# Patient Record
Sex: Female | Born: 1940 | Race: White | Hispanic: No | Marital: Married | Smoking: Current every day smoker
Health system: Southern US, Community
[De-identification: ages and names within clinical notes are randomized; demographics above are authoritative.]

## PROBLEM LIST (undated history)

## (undated) DIAGNOSIS — I639 Cerebral infarction, unspecified: Secondary | ICD-10-CM

## (undated) DIAGNOSIS — I701 Atherosclerosis of renal artery: Secondary | ICD-10-CM

## (undated) DIAGNOSIS — I739 Peripheral vascular disease, unspecified: Secondary | ICD-10-CM

## (undated) DIAGNOSIS — I10 Essential (primary) hypertension: Secondary | ICD-10-CM

## (undated) DIAGNOSIS — E785 Hyperlipidemia, unspecified: Secondary | ICD-10-CM

## (undated) DIAGNOSIS — I6529 Occlusion and stenosis of unspecified carotid artery: Secondary | ICD-10-CM

## (undated) DIAGNOSIS — I2510 Atherosclerotic heart disease of native coronary artery without angina pectoris: Secondary | ICD-10-CM

## (undated) HISTORY — DX: Atherosclerotic heart disease of native coronary artery without angina pectoris: I25.10

## (undated) HISTORY — DX: Occlusion and stenosis of unspecified carotid artery: I65.29

## (undated) HISTORY — DX: Peripheral vascular disease, unspecified: I73.9

## (undated) HISTORY — DX: Essential (primary) hypertension: I10

## (undated) HISTORY — PX: INGUINAL HERNIA REPAIR: SUR1180

## (undated) HISTORY — PX: TUBAL LIGATION: SHX77

## (undated) HISTORY — PX: VASCULAR SURGERY: SHX849

## (undated) HISTORY — DX: Hyperlipidemia, unspecified: E78.5

## (undated) HISTORY — DX: Cerebral infarction, unspecified: I63.9

---

## 2005-03-11 ENCOUNTER — Ambulatory Visit (HOSPITAL_COMMUNITY): Admission: RE | Admit: 2005-03-11 | Discharge: 2005-03-11 | Payer: Self-pay | Admitting: Vascular Surgery

## 2005-04-04 HISTORY — PX: OTHER SURGICAL HISTORY: SHX169

## 2006-01-04 DIAGNOSIS — I639 Cerebral infarction, unspecified: Secondary | ICD-10-CM

## 2006-01-04 HISTORY — DX: Cerebral infarction, unspecified: I63.9

## 2006-04-12 ENCOUNTER — Ambulatory Visit: Payer: Self-pay | Admitting: Vascular Surgery

## 2006-06-26 IMAGING — CR DG CHEST 2V
2 series · 2 of 2 positions shown · non-contrast
Comparison: none

CLINICAL DATA: Left fem/pop occlusive disease with embolism to the left toes.   Preop chest x-ray.
 CHEST - 2 VIEW:

[view not recorded (1 of 2)]
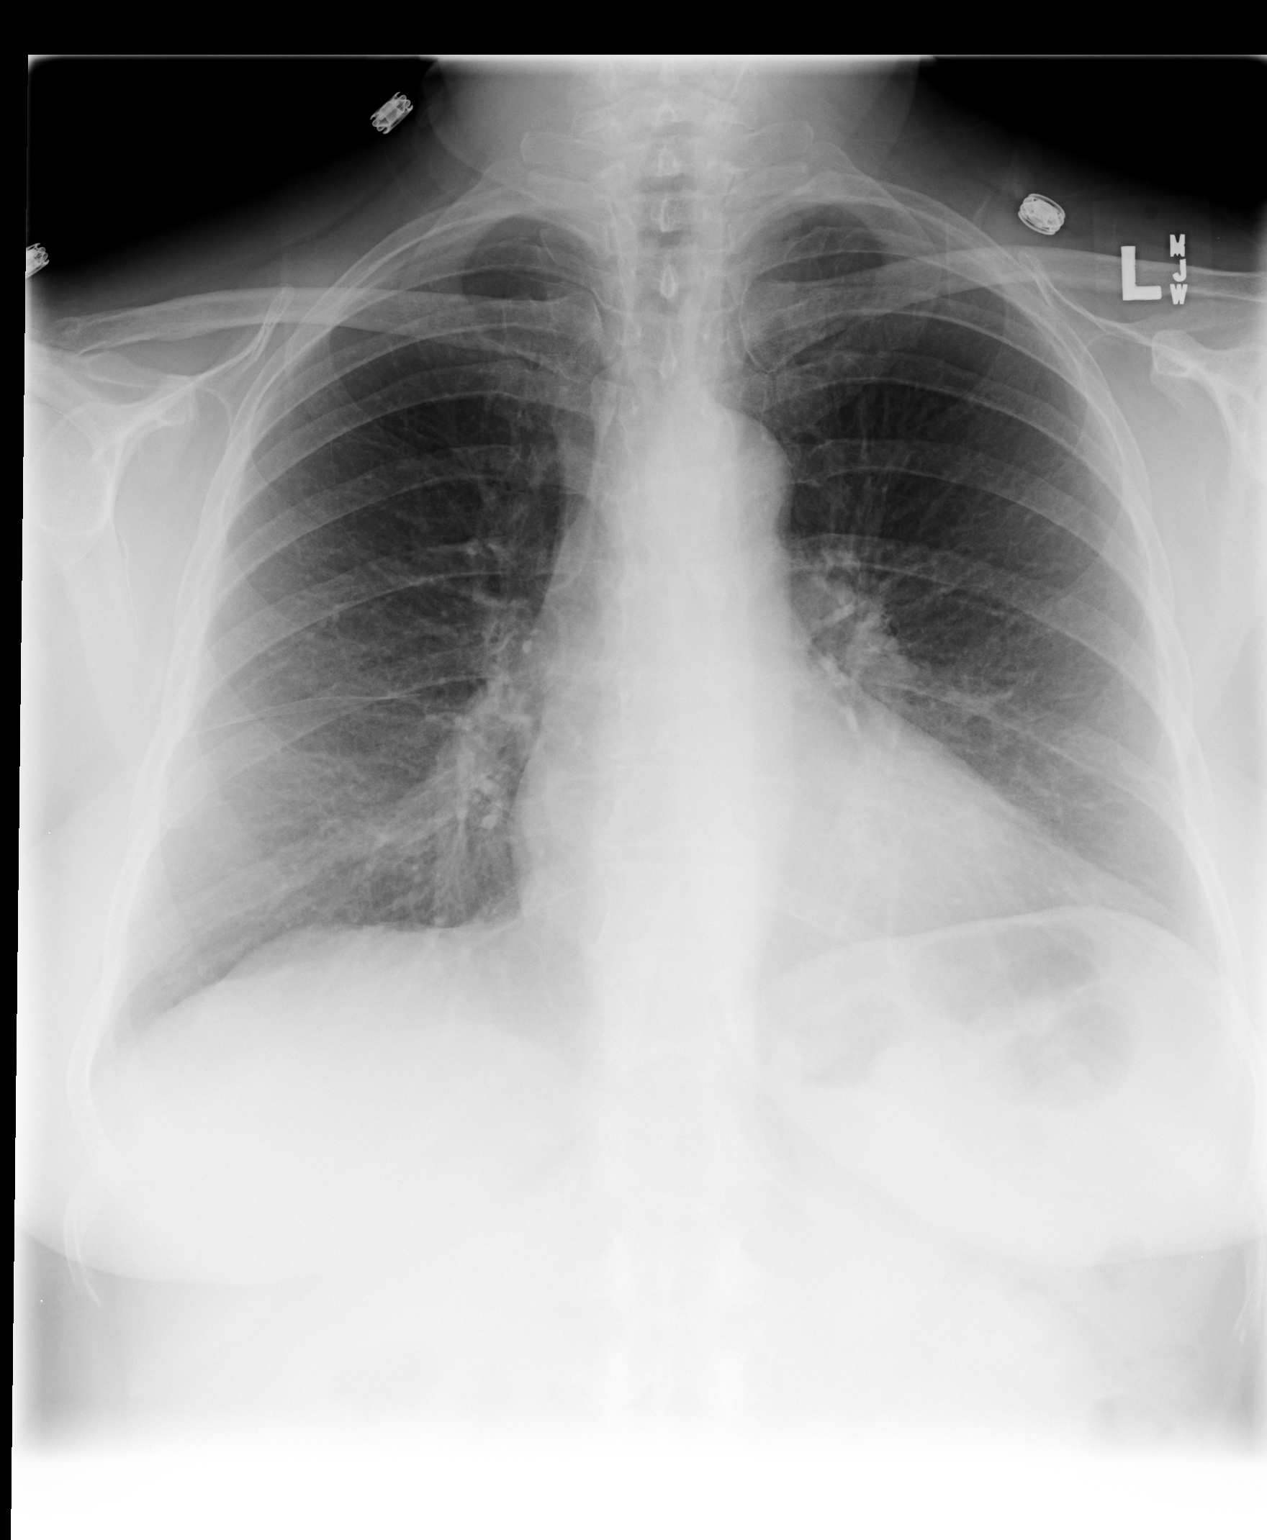

[view not recorded (2 of 2)]
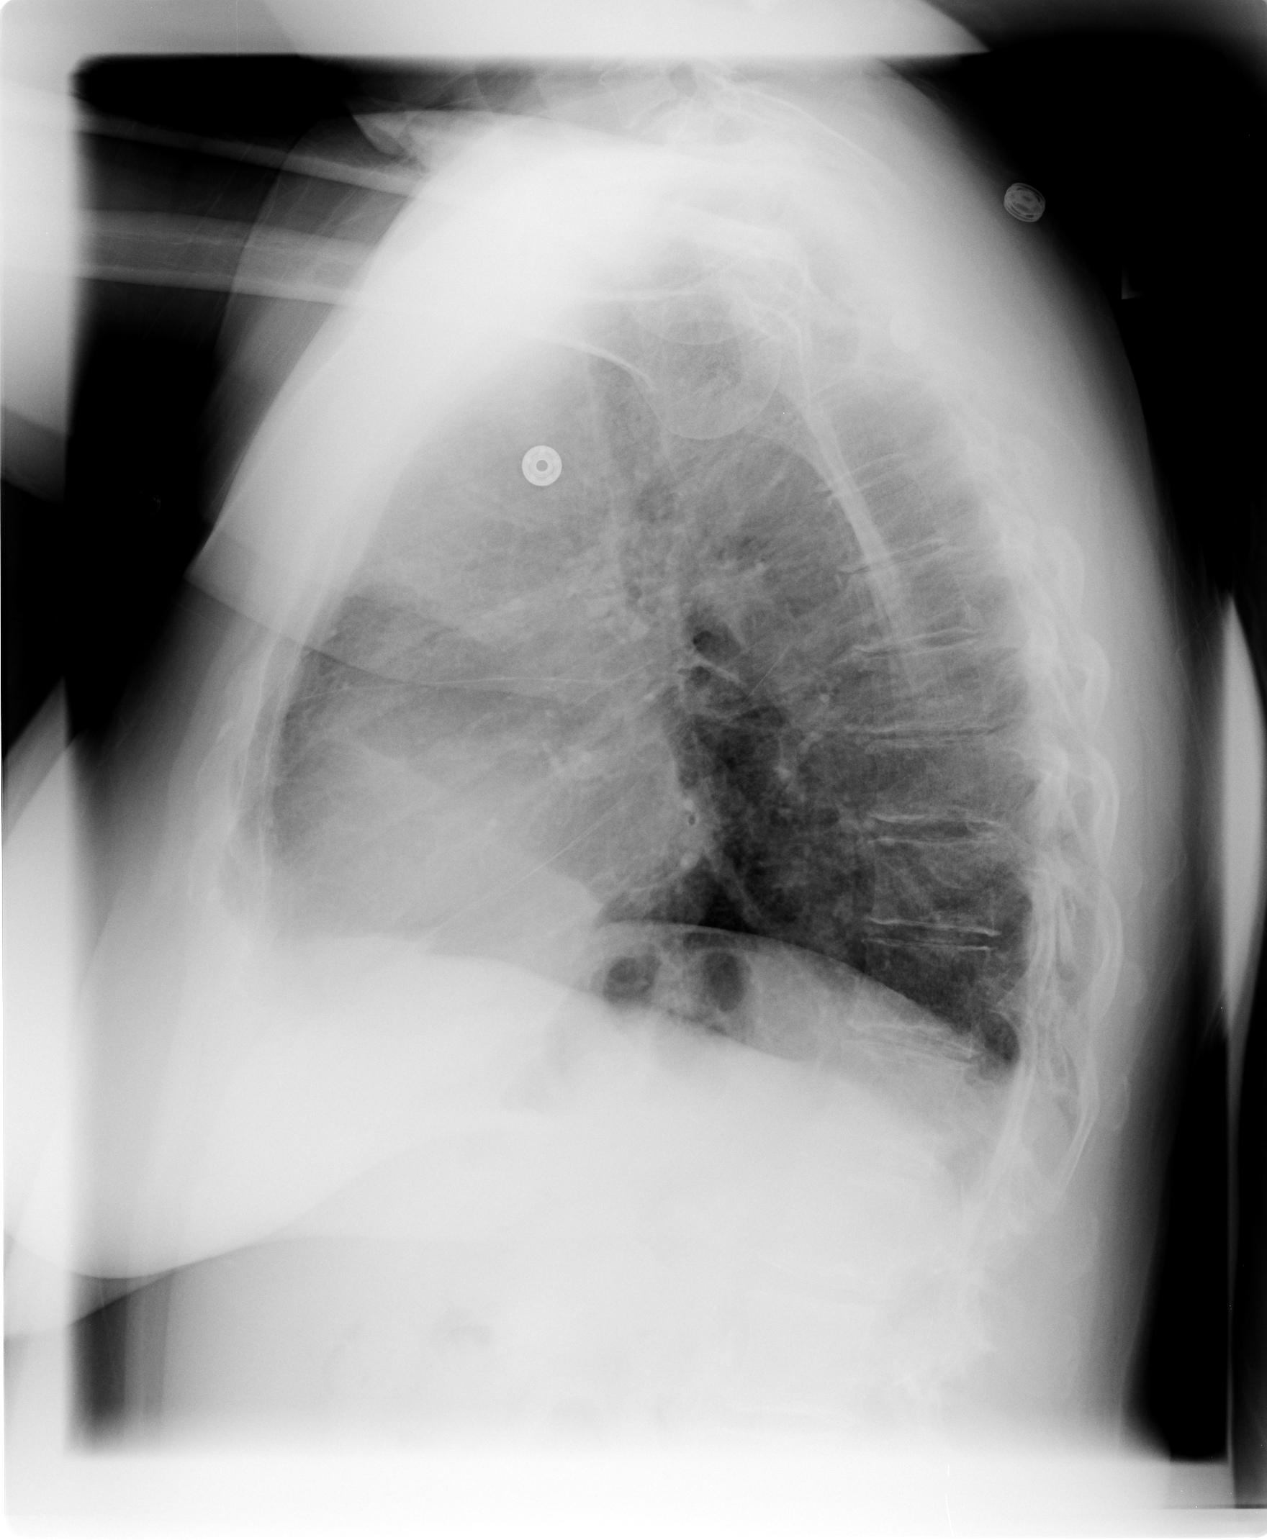

[2 of 2 positions shown; findings below may reference images not displayed]

FINDINGS: Two views of the chest show no active infiltrate or effusion.  There is peribronchial thickening noted.  The heart is within the upper limits of normal.  No bony abnormality is seen.
IMPRESSION: No active lung disease.  Peribronchial thickening.

## 2007-04-21 ENCOUNTER — Ambulatory Visit: Payer: Self-pay | Admitting: Vascular Surgery

## 2008-01-05 HISTORY — PX: OTHER SURGICAL HISTORY: SHX169

## 2008-10-08 ENCOUNTER — Ambulatory Visit: Payer: Self-pay | Admitting: Vascular Surgery

## 2008-10-17 ENCOUNTER — Ambulatory Visit (HOSPITAL_COMMUNITY): Admission: RE | Admit: 2008-10-17 | Discharge: 2008-10-17 | Payer: Self-pay | Admitting: Surgery

## 2008-10-17 ENCOUNTER — Ambulatory Visit: Payer: Self-pay | Admitting: Surgery

## 2008-11-12 ENCOUNTER — Ambulatory Visit: Payer: Self-pay | Admitting: Vascular Surgery

## 2009-01-30 ENCOUNTER — Ambulatory Visit: Payer: Self-pay | Admitting: Vascular Surgery

## 2009-04-21 ENCOUNTER — Ambulatory Visit: Payer: Self-pay | Admitting: Surgery

## 2009-04-29 ENCOUNTER — Ambulatory Visit: Payer: Self-pay | Admitting: Surgery

## 2009-04-29 ENCOUNTER — Ambulatory Visit (HOSPITAL_COMMUNITY): Admission: RE | Admit: 2009-04-29 | Discharge: 2009-04-29 | Payer: Self-pay | Admitting: Surgery

## 2009-04-30 HISTORY — PX: OTHER SURGICAL HISTORY: SHX169

## 2009-05-30 ENCOUNTER — Ambulatory Visit: Payer: Self-pay | Admitting: Surgery

## 2009-11-18 ENCOUNTER — Ambulatory Visit: Payer: Self-pay | Admitting: Vascular Surgery

## 2010-03-24 LAB — POCT I-STAT, CHEM 8
Calcium, Ion: 1.22 mmol/L (ref 1.12–1.32)
HCT: 41 % (ref 36.0–46.0)
Hemoglobin: 13.9 g/dL (ref 12.0–15.0)

## 2010-04-09 LAB — POCT I-STAT, CHEM 8
BUN: 22 mg/dL (ref 6–23)
Creatinine, Ser: 1 mg/dL (ref 0.4–1.2)
Hemoglobin: 14.3 g/dL (ref 12.0–15.0)
Sodium: 144 mEq/L (ref 135–145)

## 2010-05-19 NOTE — Procedures (Signed)
CAROTID DUPLEX EXAM   INDICATION:  Carotid disease.   HISTORY:  Diabetes:  No.  Cardiac:  No.  Hypertension:  Yes.  Smoking:  Yes.  Previous Surgery:  No carotid surgery.  CV History:  Patient states a history of TIA, currently asymptomatic.  Amaurosis Fugax No, Paresthesias No, Hemiparesis No.                                       RIGHT             LEFT  Brachial systolic pressure:         140               137  Brachial Doppler waveforms:         Normal            Normal  Vertebral direction of flow:        Antegrade         Antegrade  DUPLEX VELOCITIES (cm/sec)  CCA peak systolic                   68                89  ECA peak systolic                   104               79  ICA peak systolic                   150               182  ICA end diastolic                   43                47  PLAQUE MORPHOLOGY:                  Heterogenous      Heterogenous  PLAQUE AMOUNT:                      Mild              Mild  PLAQUE LOCATION:                    Distal CCA        ICA/ECA/CCA   IMPRESSION:  1. Doppler velocities of the bilateral internal carotid arteries      suggest 40% to 59% stenoses; however, the elevated velocities      appear to be mostly due to vessel tortuosity of the bilateral      proximal-to-mid internal carotid arteries.  2. No significant change in velocities when compared to the previous      examination on 10/07/2008.   ___________________________________________  Quita Skye. Hart Rochester, M.D.   CH/MEDQ  D:  11/18/2009  T:  11/18/2009  Job:  [id number]

## 2010-05-19 NOTE — Procedures (Signed)
BYPASS GRAFT EVALUATION   INDICATION:  Followup of a right Fem-Pop bypass graft and PTA stent.   HISTORY:  Diabetes:  No.  Cardiac:  No.  Hypertension:  Yes.  Smoking:  Yes.  Previous Surgery:  Left fem-pop graft and PTA stent in March, 2007.   SINGLE LEVEL ARTERIAL EXAM                               RIGHT              LEFT  Brachial:                    117                128  Anterior tibial:             57                 84  Posterior tibial:            99                 120  Peroneal:  Ankle/brachial index:        0.77               0.94   PREVIOUS ABI:  Date: 04/21/07  RIGHT:  1  LEFT:  1   LOWER EXTREMITY BYPASS GRAFT DUPLEX EXAM:   DUPLEX:  Bypass graft is patent with no stenosis noted.   IMPRESSION:  1. Ankle brachial index on the right suggests moderate disease.  2. Ankle brachial index on the left suggests within normal limits.   ___________________________________________  Quita Skye Hart Rochester, M.D.   CJ/MEDQ  D:  10/08/2008  T:  10/09/2008  Job:  [id number]

## 2010-05-19 NOTE — Procedures (Signed)
BYPASS GRAFT EVALUATION   INDICATION:  Follow up bilateral lower extremity revascularization with  left increased claudication for approximately 1 week.   HISTORY:  Diabetes:  No.  Cardiac:  No.  Hypertension:  Yes.  Smoking:  Yes.  Previous Surgery:  Right SFA/popliteal artery stents, 10/17/2008 by Dr.  Myra Gianotti.  Left SFA stent, 03/11/2005 by Dr. Hart Rochester.   SINGLE LEVEL ARTERIAL EXAM                               RIGHT              LEFT  Brachial:                    132                133  Anterior tibial:             121                94  Posterior tibial:            126                99  Peroneal:  Ankle/brachial index:        0.95               0.74   PREVIOUS ABI:  Date: 01/30/2009  RIGHT:  0.97  LEFT:  0.95   LOWER EXTREMITY BYPASS GRAFT DUPLEX EXAM:   DUPLEX:  1. Bilateral common femoral arteries through popliteal arteries appear      patent, including bilateral SFA stents.  2. Stable elevated velocities at the right proximal SFA of 361 cm/s      and left proximal SFA of 225 cm/s.  3. New elevated velocities of 420 cm/s were noted in the left      popliteal artery.   IMPRESSION:  1. Patent bilateral superficial femoral artery stents with no in-stent      stenosis.  2. Stable >50% proximal superficial femoral artery stenoses.  3. New >75% left popliteal artery stenosis.  4. Right ankle brachial index appears stable.  5. Left ankle brachial index shows decrease from previous study.        ___________________________________________  V. Charlena Cross, MD   AS/MEDQ  D:  04/21/2009  T:  04/21/2009  Job:  [id number]

## 2010-05-19 NOTE — Assessment & Plan Note (Signed)
OFFICE VISIT   Felicia [name], Felicia [name]  DOB:  Jul 08, 1940                                       11/12/2008  CHART#:[id number]   The patient is a 70 year old female who recently developed embolic  episode to her right foot and underwent angiography with PTA and  stenting of the right superficial femoral and popliteal artery by Dr.  Myra [name] IV.  [name] had previously stented her left superficial femoral  artery for a similar problem in March 2007.  Since then she has noted  complete resolution of her claudication symptoms and has had no  discomfort in the toes.  [name] ordered lower extremity arterial Doppler  studies today which [name] interpreted and reviewed and she does have  biphasic flow in both feet with an ABI of 0.98 on the right and 0.75 on  the left, slightly down on the left side.   PAST MEDICAL HISTORY:  She has the following stable chronic problems:  1. Hypertension.  2. Hyperlipidemia.  3. 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  4. Negative for coronary artery disease, COPD or diabetes.   REVIEW OF SYSTEMS:  Denies any chest pain, dyspnea on exertion.  No  pulmonary problems such as hemoptysis or chronic bronchitis.  All other  systems are negative except for the previous mini stroke.   SOCIAL HISTORY:  She is married and has three children.  Continues to  smoke third of a pack of cigarettes per day.   PHYSICAL EXAM:  Blood pressure 138/70, heart rate 66, respirations 16.  She is alert and oriented x3.  Neck is supple with 3+ carotid pulses.  No bruits are audible.  Neurologic examination is normal.  No palpable  adenopathy in the neck.  Chest:  Clear to auscultation.  Cardiovascular:  Regular rhythm with no murmurs.  Abdomen:  Soft and nontender with no  masses.  She has 3+ femoral and 2+ popliteal pulses bilaterally with  well-perfused lower extremities.   She is doing well from the standpoint of her stents in the  right leg and  we will follow her on a regular basis in the protocol.  [name] have ordered a  repeat scan in 6 months of her lower extremity stent sites and also a  repeat carotid duplex exam to be done in 12 months to follow her  moderate carotid disease.  If she has any new symptoms she will be in  touch with [place] in the interim.  She is currently taking Aggrenox  recommended by her neurologist.   Felicia [name]. Felicia [name], M.D.  Electronically Signed   JDL/MEDQ  D:  11/12/2008  T:  11/13/2008  Job:  [id number]   cc:   Dr. Donnetta Hutching

## 2010-05-19 NOTE — Assessment & Plan Note (Signed)
OFFICE VISIT   Felicia, WINDHOLZ [name]  DOB:  March 16, 1940                                       11/18/2009  CHART#:[id number]   The patient returns for continued follow-up regarding her lower  extremity occlusive disease and carotid occlusive disease.  She denies  any hemispheric or nonhemispheric TIAs, amaurosis fugax, diplopia,  blurred vision or syncope.  She most recently had balloon angioplasty of  her popliteal artery by Dr. Myra [name] distal to a previously placed stent.  This was performed in April of this year.  She has had no claudication  symptoms in either calf since that time.  She takes Aggrenox on a daily  basis.   CHRONIC MEDICAL PROBLEMS:  1. Hypertension.  2. Hyperlipidemia.  3. Previous small CVA with carotid disease which is mild to moderate.  4. Negative for coronary artery disease, COPD or diabetes.   SOCIAL HISTORY:  Married, has three children.  She tends to smoke about  third pack of cigarettes per day.   REVIEW OF SYSTEMS:  Denies any chest pain, dyspnea on exertion,  claudication, no symptoms complained of in review of systems.   PHYSICAL EXAMINATION:  Blood pressure 141/66, heart rate 64,  respirations 14.  General:  Well-developed, well-nourished female in no  apparent distress.  HEENT:  Exam is normal for age.  EOMs intact.  Lungs:  Clear to auscultation.  No rhonchi or wheezing.  Cardiovascular:  Regular rhythm, no murmurs.  Abdomen:  Soft, nontender with no masses.  Musculoskeletal:  Free of major deformities.  Neurologic:  Normal.  Lower extremities:  3+ femorals,  2+ popliteals and 2+ dorsalis pedis  pulses bilaterally.   The patient is doing well following right superficial femoral artery  stent and left popliteal stent.  She also is asymptomatic from her  carotid standpoint.  Today [name] ordered carotid duplex exam which [name] have  reviewed and interpreted and she has moderate bilateral internal carotid  flow reduction in the 50%  to 60% range, left worse than right.  She also  has ABIs greater than 1 bilaterally.  We will continue to follow her on  a regular basis for restenosis within the stents or sooner if she should  develop any new symptoms.     Felicia [name], M.D.  Electronically Signed   JDL/MEDQ  D:  11/18/2009  T:  11/19/2009  Job:  [id number]

## 2010-05-19 NOTE — Procedures (Signed)
BYPASS GRAFT EVALUATION   INDICATION:  Follow-up evaluation of right superficial femoral artery  and popliteal stents and left superficial femoral artery.   HISTORY:  Diabetes:  No.  Cardiac:  No.  Hypertension:  Yes.  Smoking:  Yes.  Previous Surgery:  On 10/17/08, right superficial femoral artery and  right popliteal artery stent, left superficial femoral artery stent on  03/11/05.   SINGLE LEVEL ARTERIAL EXAM                               RIGHT              LEFT  Brachial:                    127                123  Anterior tibial:             0.97               0.79  Posterior tibial:            0.91               0.95  Peroneal:  Ankle/brachial index:        0.97               0.95   PREVIOUS ABI:  Date: 11/12/08  RIGHT:  0.98  LEFT:  0.75   LOWER EXTREMITY BYPASS GRAFT DUPLEX EXAM:   DUPLEX:  The right superficial femoral artery stent and popliteal stent  appears patent with biphasic waveforms noted.  There are elevated  velocities of 279 cm at the right proximal superficial femoral artery.  The left superficial femoral artery appears patent with biphasic  waveforms noted.  There are increased velocities of 222 cm/s noted at  the proximal left superficial femoral artery.   IMPRESSION:  1. Bilateral ankle brachial indices are within normal limits.  2. Patent bilateral lower extremities with elevated velocities, as      described above.   ___________________________________________  Larina Earthly, M.D.   CB/MEDQ  D:  01/30/2009  T:  01/30/2009  Job:  [id number]

## 2010-05-19 NOTE — Procedures (Signed)
CAROTID DUPLEX EXAM   INDICATION:  Previous transient ischemic attack.   HISTORY:  Diabetes:  No.  Cardiac:  No.  Hypertension:  Yes.  Smoking:  Yes.  Previous Surgery:  Left fem-pop graft and PTA stent, 03/11/05.  CV History:  Amaurosis Fugax No, Paresthesias No, Hemiparesis No.                                       RIGHT             LEFT  Brachial systolic pressure:         117               128  Brachial Doppler waveforms:         Triphasic         Triphasic  Vertebral direction of flow:        Antegrade         Antegrade  DUPLEX VELOCITIES (cm/sec)  CCA peak systolic                   72                71  ECA peak systolic                   118               103  ICA peak systolic                   138               171  ICA end diastolic                   44                61  PLAQUE MORPHOLOGY:                  Mixed             Mixed  PLAQUE AMOUNT:                      40-59%            40-59%  PLAQUE LOCATION:                    ICA               ICA   IMPRESSION:  Internal carotid artery 40-59% stenosis noted bilaterally.   ___________________________________________  Quita Skye Hart Rochester, M.D.   CJ/MEDQ  D:  10/08/2008  T:  10/09/2008  Job:  [id number]

## 2010-05-19 NOTE — Assessment & Plan Note (Signed)
OFFICE VISIT   [name], [name]  DOB:  July 02, 1940                                       04/21/2009  CHART#:[id number]   The patient comes in today for an unscheduled visit.  She had an  ultrasound today which reveals high-grade stenosis in her left popliteal  artery distal to her previously placed stents.  She has had stents  placed in the right and left leg, she has had decrease in her left ABI  of 0.95 to 0.74 today.  The right ABI is stable.  She describes for the  past week having worsening pain in her leg and claudication with walking  approximately 100 feet.  She also has noticed some discoloration on the  back of her heels.  She does not have any open wounds.   PHYSICAL EXAMINATION:  Heart rate 73, blood pressure on 149/75,  temperature is 98.4.  She is well-appearing, no distress.  Cardiovascular:  Regular rate and rhythm.  Extremities:  Warm, well-  perfused.  There is slight pallor to the left heel, the foot is viable.  Motor and sensory functions are intact.   DIAGNOSTIC STUDIES:  Ultrasound performed today shows velocity elevation  of 225 in her proximal SFA and 420 cm/sec in her popliteal artery on the  left.  On the right side there was elevated velocity in the proximal SFA  at 361 cm/sec.   ASSESSMENT/PLAN:  Worsening left leg claudication.   PLAN:  The patient will be scheduled for an aortogram with bilateral  runoff with focus on the left leg.  [name] will plan on accessing the right  leg, and intervening on the left if it is the appropriate thing to do.  All the patient's questions were answered today.     Jorge Ny, MD  Electronically Signed   VWB/MEDQ  D:  04/21/2009  T:  04/22/2009  Job:  [id number]

## 2010-05-19 NOTE — Assessment & Plan Note (Signed)
OFFICE VISIT   [name], [name]  DOB:  February 11, 1940                                       10/08/2008  CHART#:[id number]   This is an established patient.   [name] is a 70 year old female well-known to me having undergone  stenting of the left superficial femoral artery by me in March of 2007  after presentation with an embolic event to the toe of her left foot.  She has done very well from that procedure with no recurrent emboli and  resolution of claudication symptoms and has continued to have excellent  ABI in the left leg today being 0.94.  She now presents with a similar  complaint of the right lower extremity with right fifth toe becoming  discolored about 3 weeks ago.  She had a corn on the lateral aspect of  her right fifth toe which is unrelated but developed spontaneous  blueness of the toe and she has some mild numbness associated with this.  She states that the blueness does come and go, however.  Also states  that she had a small stroke in June of this year where her right hand  became clumsy while working at her desk and she also developed slurred  speech.  This all resolved in about 20 minutes, but MRI revealed a small  left brain stroke.  Carotid duplex was performed, but we do not have  results of that.  CT scan reveals no bleeding.  She has had right calf  claudication for the last 3-4 months which limits her walking.  She is  on aspirin and Aggrenox.   PHYSICAL EXAM TODAY:  Vital signs:  Blood pressure is 125/69, heart rate  69, respirations 18.  She has 3+ carotid pulses bilaterally with soft  bruit on the left.  Neurologic:  Normal.  No palpable adenopathy in the  neck.  Chest:  Clear to auscultation.  Cardiovascular:  Regular rhythm  no murmurs.  Abdomen:  Soft, nontender with no masses.  Right leg has a  3+ femoral, absent popliteal and distal pulses.  Right fifth toe has  bluish discoloration but no ulceration.  There is a callus on  the  lateral aspect of the right fifth toe which is not infected.  Left leg  is well-perfused with 3+ femoral, 2+ popliteal and 1-2+ dorsalis pedis  pulse.   Carotid duplex in our office today reveals moderate left internal  carotid stenosis in the 50-65% range with minimal flow reduction on the  right side.   [name] reviewed her lower extremity arterial Doppler studies performed in  [place] which revealed bilateral superficial femoral occlusive disease  with 0.96 on the left and 0.68 on the right.  [name] think she probably did  embolize from the superficial femoral artery similar to previous  experience and we will schedule her for angiography and possible PTA and  stenting of the right superficial femoral artery by Dr. Myra Gianotti IV on  14th of this month (October 14).  She does have some carotid disease on  the left side consistent with previous left brain stroke and if she has  any further symptoms should undergo left carotid endarterectomy.  She  has not had any symptoms since the event in June and her disease is mild  to moderate, so will not recommend surgery at this time.  We  will  proceed with evaluation of her lower extremities.   Quita Skye Hart Rochester, M.D.  Electronically Signed   JDL/MEDQ  D:  10/08/2008  T:  10/09/2008  Job:  [id number]   cc:   Dr. Joyce Copa

## 2010-05-19 NOTE — Procedures (Signed)
LOWER EXTREMITY ARTERIAL DUPLEX   INDICATION:  Follow up bilateral lower extremity stents.   HISTORY:  Diabetes:  No.  Cardiac:  No.  Hypertension:  Yes.  Smoking:  Yes.  Previous Surgery:  Left superficial femoral artery stent on 03/11/2005,  right superficial femoral and popliteal artery stents on 10/17/2008,  left popliteal stent on 04/30/2009.   SINGLE LEVEL ARTERIAL EXAM                          RIGHT                LEFT  Brachial:               140                  137  Anterior tibial:        144                  114  Posterior tibial:       137                  140  Peroneal:  Ankle/Brachial Index:   1.03                 1.0   LOWER EXTREMITY ARTERIAL DUPLEX EXAM   DUPLEX:  Biphasic Doppler waveforms noted throughout the bilateral lower  extremities with velocities of 292 and 216 cm/s noted in the right and  left proximal superficial femoral arteries, respectively.   IMPRESSION:  1. Patent bilateral lower extremity stents with increased velocities      noted, as described above.  2. Stable bilateral ankle brachial indices noted when compared to the      previous examination on 05/30/2009.   ___________________________________________  Quita Skye. Hart Rochester, M.D.   CH/MEDQ  D:  11/18/2009  T:  11/18/2009  Job:  [id number]

## 2010-05-19 NOTE — Assessment & Plan Note (Signed)
OFFICE VISIT   [name], [name]  DOB:  [birth year]                                       05/30/2009  CHART#:[id number]   [name] am dictating for Dr. Hart Rochester and Dr. Myra Gianotti.  Date of surgery was  04/29/2009.   The patient returns to clinic today following aortogram with stent  placement in her left popliteal artery.  This was performed by Dr.  Myra Gianotti.   She states that she has been doing very well since her procedure.  She  is able to walk and not have claudication symptoms.  Her dyslipidemia  continues to be very well treated by her primary care physician, Dr.  Norval Gable.   Physical findings today revealed a very pleasant elderly woman in no  apparent distress.  She was well-nourished.  Her heart rate was 75,  blood pressure 123/73, O2 saturation was 97%.  HEENT:  NCAT.  PERRLA.  EOMI.  Mucous membranes were pink and moist.  Trachea was midline.  There was no jugular venous distention.  Cardiac exam revealed a regular  rate and rhythm.  Musculoskeletal exam demonstrated no major deformities  or cyanosis.   She was evaluated with ABIs.  Her ABIs did demonstrate significant  improvement on the left from 0.74 to 1.01.  On the right previous ABI  was 0.95 and today is 0.99.   The patient continues to do very well following stent placement into her  left popliteal artery.  She is at this time having no symptoms of  claudication or tissue loss.  We will follow her in 6 months with  another ABI.  As she was leaving she did mention that several months ago  she had TIAs.  She did desire to follow up with [place] with this problem and  had actually discussed it with Dr. Hart Rochester at an earlier time.  We will  obtain a carotid ultrasound on her at her next visit.   Wilmon Arms, PA   Larina Earthly, M.D.  Electronically Signed   KEL/MEDQ  D:  05/30/2009  T:  05/30/2009  Job:  [id number]

## 2010-05-19 NOTE — Procedures (Signed)
BYPASS GRAFT EVALUATION   INDICATION:  Follow-up evaluation of left SFA, PTA, and stent.  Patient  denies claudication and rest pain bilaterally.   HISTORY:  Diabetes:  No.  Cardiac:  No.  Hypertension:  Yes.  Smoking:  Yes, 2-3 cigarettes per day.  Previous Surgery:  Left SFA, PTA and stent on 03/11/05 by Dr. Hart Rochester.   SINGLE LEVEL ARTERIAL EXAM                               RIGHT              LEFT  Brachial:                    154                156  Anterior tibial:             162                156  Posterior tibial:            150                150  Peroneal:  Ankle/brachial index:        >1.0               1.0   PREVIOUS ABI:  Date: 04/12/06  RIGHT:  1.12  LEFT:  0.98   LOWER EXTREMITY BYPASS GRAFT DUPLEX EXAM:   DUPLEX:  Patent right CFA, SFA and stent, and popliteal arteries without  evidence of stenosis.   IMPRESSION:  1. Essentially stable ankle brachial indices bilaterally.  2. Patent right common femoral artery, superficial femoral artery and      stent, and popliteal arteries without evidence of stenosis.      ___________________________________________  Quita Skye Hart Rochester, M.D.   PB/MEDQ  D:  04/21/2007  T:  04/21/2007  Job:  [id number]

## 2010-05-22 NOTE — Op Note (Signed)
[name], [name]               ACCOUNT NO.:  [id number]   MEDICAL RECORD NO.:  [id number]          PATIENT TYPE:  INP   LOCATION:  NA                           FACILITY:  [facility]   PHYSICIAN:  Quita Skye. Hart Rochester, M.D.  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   PROCEDURE:  1.  Abdominal aortogram with bilateral lower extremity runoff via right      common femoral approach.  2.  Selective catheterization of left superficial femoral artery via right      common femoral approach with (a) predilatation of left superficial      femoral artery stenosis using a 4x4 Powerflex PTA catheter at 8      atmospheres for 30 seconds (b) insertion of Smart stent (6 mm x 4 cm) in      the left superficial femoral artery and (c) post stent dilatation using      a 6x4 Powerflex catheter at 8 atmospheres for 45 seconds with completion      angiogram.   SURGEON:  Dr. Hart Rochester.   ANESTHESIA:  Local Xylocaine.   CONTRAST:  160 mL.   COMPLICATIONS:  None.   HEPARIN:  4000 units.   DESCRIPTION OF PROCEDURE:  The patient was taken to [facility] peripheral  endovascular lab and placed in supine position at which time both groins  were prepped Betadine scrub solution draped routine sterile manner. After  infiltration of 1% Xylocaine, right common femoral artery was entered  percutaneously. Guidewire passed into the suprarenal aorta under  fluoroscopic guidance. A 5-French sheath and dilator passed over the guide  wire, dilator removed. Standard pigtail catheter positioned in the  suprarenal aorta. A flush abdominal aortogram was performed injecting 20 mL  contrast at 20 mL per second. This revealed the aorta to be widely patent  with a single renal artery on the right arising from the midportion of the  aorta which had a 70%  concentric stenosis near its origin. The left kidney  had two renal arteries, one arising most superiorly which was the dominant  renal artery with no stenosis and a second one arising at the level the  inferior mesenteric artery with no stenosis noted. Infrarenal aorta itself  was involved with significant atherosclerotic disease with multiple areas of  ulceration but no areas of stenosis. Both common iliac arteries were mildly  involved with mild narrowing of the origin of the right common iliac artery  approximating 40% and the left side having an approximate 50% stenosis. Both  internal iliac arteries were diseased, but patent and the external iliac  arteries were widely patent. On the right side, superficial femoral artery  had mild diffuse disease and patent throughout, popliteal artery being  patent.  Runoff on the right leg was not well-visualized but the anterior  tibial artery was the dominant vessel. On the left side, the common femoral  and profunda femoris arteries were widely  patent. The superficial femoral  artery on the left was widely patent proximally but in the mid thigh, there  was a 2 cm segment of severe atherosclerosis causing an approximate 80-90%  narrowing with the vessel mildly diseased proximal and distal to this point  but widely patent. On the left the popliteal artery was opened across the  knee joint with two-vessel runoff through the anterior tibial and peroneal  arteries, posterior tibial artery being occluded.  In order to obtain runoff  the catheter had been withdrawn into the terminal aorta, injecting 88 mL of  contrast 8 mL per second. It was then decided to treat the left superficial  femoral artery lesion with a Smart stent via the right common femoral  approach because of the emboli to the left foot. Four thousand units of  heparin was then administered intravenously. The 5 sheath was then exchanged  for a 6 long Terumo sheath after the left common  iliac artery had been  selectively catheterized with an IMA catheter and the Wholey wire advanced  into the left superficial femoral artery. The Terumo sheath was then  advanced down to the distal left external iliac artery with the [facility] wire  easily advancing past the lesion in the mid thigh. For precision a Glow and  Tell marker was used in the thigh. Following this a 4 mm x 4 cm Powerflex  catheter was used to predilate the lesion and this was inflated at 8  atmospheres for 30 seconds with completion angiogram revealing some  improvement of this irregular lesion. A Smart stent was then deployed (6 mm  x 4 cm) and a dilatation of the stent was performed using a 6 mm x 4 cm  Powerflex balloon catheter at 8 atmospheres for 45 seconds. Completion  angiogram revealed excellent cosmetic result with good flow beyond this  area. Following this the long sheath was removed after correction of the  anticoagulation and adequate compression applied to the right groin.  No  complications ensued.   FINDINGS:  1.  Diffuse atherosclerotic disease of infrarenal aorta with no stenosis.  2.  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  3.  Two left renal arteries with no significant stenosis.  4.  40% proximal right common iliac stenosis and a 50% proximal left common      iliac stenosis.  5.  Segmental stenosis of left superficial femoral artery approximating 80      to 90%.  6.  Two-vessel runoff on the left via anterior tibial and peroneal artery.  7.  Successful PTA and deployment of Smart stent, left superficial femoral      artery (6 mm x 4 cm) with completion angiogram revealing widely patent      left superficial femoral artery post stenting.           ______________________________  Quita Skye. Hart Rochester, M.D.     JDL/MEDQ  D:  03/11/2005  T:  03/12/2005  Job:  [id number]

## 2010-05-22 NOTE — H&P (Signed)
[name], [name]               ACCOUNT NO.:  [id number]   MEDICAL RECORD NO.:  [id number]           PATIENT TYPE:   LOCATION:                                 FACILITY:   PHYSICIAN:  Quita Skye. Hart Rochester, M.D.  DATE OF BIRTH:  03/09/2003   DATE OF ADMISSION:  03/12/2005  DATE OF DISCHARGE:                                HISTORY & PHYSICAL   HISTORY OF PRESENT ILLNESS AND CONSULTATION NOTE:   CHIEF COMPLAINT:  Ischemic changes, left foot, secondary to femoral-  popliteal arterial occlusive disease.   HISTORY OF PRESENT ILLNESS:  This 70 year old female four weeks ago  developed spontaneous onset of pain in the left heel followed by pain in the  left fifth toe.  She noticed discoloration and bluish coloration of these  areas, and she then developed some pain in the left calf.  She has had  claudication symptoms over the last 6 weeks in the left calf, limiting her  to walking 1 block.  She has no symptoms in the contralateral right leg and  no history of nonhealing ulcers or arterial insufficiency.   PAST MEDICAL HISTORY:  Hyperlipidemia but negative for diabetes, coronary  artery disease, stroke, COPD, or deep vein thrombosis.   PAST SURGICAL HISTORY:  1.  Right inguinal hernia repair.  2.  Tubal ligation.   FAMILY HISTORY:  Positive for coronary artery disease in her mother and  sister, and her sister required a cardiac transplant.  Negative for diabetes  and stroke in the family.   SOCIAL HISTORY:  She is married and has 3 children and is a retired  [employer] operational officer.  She has smoked a pack of cigarettes per day for 40+ years but  quit 12 days ago.  Does not use alcohol.   REVIEW OF SYSTEMS:  Denies any anorexia, weight loss, fever, chills, chest  pain, shortness of breath, dyspnea on exertion, PND, orthopnea, COPD,  chronic cough, hemoptysis, or any other GI or GU symptoms.  Does have  diffuse arthritis symptoms.   ALLERGIES:  None.   MEDICATIONS:  None.   PHYSICAL  EXAMINATION:  VITAL SIGNS: Blood pressure 140/70, heart rate 84,  respirations 16.  GENERAL: She is a middle-age female in no apparent distress, alert and  oriented x3.  NECK:  Supple, 3+ carotid pulses palpable.  No bruits are audible. No  palpable adenopathy in the neck.  NEUROLOGIC:  Normal.  UPPER EXTREMITIES: Pulses are 3+ at the brachial and radial level.  CHEST:  Clear to auscultation.  CARDIOVASCULAR: Regular rhythm, no murmurs.  ABDOMEN: Soft, nontender, with no masses.  LOWER EXTREMITIES: The right leg has 3+ femoral, popliteal, and posterior  tibial pulse palpable.  The left leg has a 3+ femoral pulse with no  popliteal or distal pulses. There are embolic changes in the left fifth,  first, and second toes with scattered changes on the other toes and also on  the left heel.  She does have motion to sensation with no calf tenderness.   LABORATORY FINDINGS:  Include arterial Dopplers done in [facility] on February 25, 2005,  which revealed left superficial femoral  occlusive disease with an ABI of 0.56, the right being 0.99.   IMPRESSION:  1.  Emboli of the left foot from left superficial femoral and popliteal      occlusive disease.  2.  Tobacco abuse.  3.  Hyperlipidemia.   PLAN:  Admit the patient on March 9 for left femoral-popliteal bypass  grafting if feasible.  She will undergo angiography on March 8.  Risks and  benefits of the surgery have been discussed with the patient in the office,  and she would like to proceed.           ______________________________  Quita Skye Hart Rochester, M.D.     JDL/MEDQ  D:  03/09/2005  T:  03/09/2005  Job:  [id number]

## 2010-11-17 ENCOUNTER — Other Ambulatory Visit (INDEPENDENT_AMBULATORY_CARE_PROVIDER_SITE_OTHER): Payer: Medicare Other | Admitting: Vascular Surgery

## 2010-11-17 ENCOUNTER — Encounter (INDEPENDENT_AMBULATORY_CARE_PROVIDER_SITE_OTHER): Payer: Medicare Other | Admitting: Vascular Surgery

## 2010-11-17 ENCOUNTER — Ambulatory Visit (INDEPENDENT_AMBULATORY_CARE_PROVIDER_SITE_OTHER): Payer: Medicare Other | Admitting: Vascular Surgery

## 2010-11-17 DIAGNOSIS — Z48812 Encounter for surgical aftercare following surgery on the circulatory system: Secondary | ICD-10-CM

## 2010-11-17 DIAGNOSIS — I739 Peripheral vascular disease, unspecified: Secondary | ICD-10-CM

## 2010-11-17 DIAGNOSIS — I6529 Occlusion and stenosis of unspecified carotid artery: Secondary | ICD-10-CM

## 2010-11-17 NOTE — Procedures (Unsigned)
LOWER EXTREMITY ARTERIAL DUPLEX  INDICATION:  Followup peripheral vascular disease  HISTORY: Diabetes:  No Cardiac:  No Hypertension:  Yes Smoking:  Currently Previous Surgery:  Left superficial femoral artery stent 03/11/2005; right superficial femoral artery and popliteal artery stent on 10/17/2008; left popliteal artery stent on 04/30/2009  SINGLE LEVEL ARTERIAL EXAM                         RIGHT                LEFT Brachial: Anterior tibial: Posterior tibial: Peroneal:                    Ankle/Brachial Index:   0.93  0.87   Previous ankle brachial index on 11/18/2009:  Right 1.03, left 1.0  LOWER EXTREMITY ARTERIAL DUPLEX EXAM  DUPLEX:  Elevated velocities suggesting 50%-75% stenosis present involving the right distal common femoral, proximal right superficial femoral, left profunda femoral and left proximal superficial femoral arteries. Elevated velocities present at the left superficial femoral artery stent distal anastomosis and left popliteal artery stenosis proximal anastomosis which may be suggestive of stenosis.   IMPRESSION: 1. Patent bilateral superficial femoral artery stents and left     popliteal artery stent with elevated velocities as noted above. 2. Native artery stenosis present as noted above. 3. Right ankle brachial index is 0.93 and left is 0.87 suggesting mild     claudication and these have bilaterally decreased since previous     study on 11/18/2009.  ___________________________________________ Quita Skye. Hart Rochester, M.D.  SH/MEDQ  D:  11/17/2010  T:  11/17/2010  Job:  [id number]

## 2010-12-14 ENCOUNTER — Other Ambulatory Visit: Payer: Self-pay | Admitting: Vascular Surgery

## 2010-12-14 DIAGNOSIS — Z48812 Encounter for surgical aftercare following surgery on the circulatory system: Secondary | ICD-10-CM

## 2010-12-14 DIAGNOSIS — I739 Peripheral vascular disease, unspecified: Secondary | ICD-10-CM

## 2010-12-14 DIAGNOSIS — I6529 Occlusion and stenosis of unspecified carotid artery: Secondary | ICD-10-CM

## 2010-12-16 ENCOUNTER — Encounter: Payer: Self-pay | Admitting: Vascular Surgery

## 2010-12-16 NOTE — Procedures (Unsigned)
CAROTID DUPLEX EXAM  INDICATION:  Followup carotid stenosis  HISTORY: Diabetes:  No Cardiac:  No Hypertension:  Yes Smoking:  Currently Previous Surgery:  No carotid intervention CV History:  History of TIA; currently asymptomatic Amaurosis Fugax No, Paresthesias No, Hemiparesis No                                      RIGHT             LEFT Brachial systolic pressure:         125               126 Brachial Doppler waveforms:         WNL               WNL Vertebral direction of flow:        Antegrade         Antegrade DUPLEX VELOCITIES (cm/sec) CCA peak systolic                   73                     72 ECA peak systolic                   108                    120 ICA peak systolic                   57-P/153-PX/M          231 ICA end diastolic                   22-P/47-PX/M           67 PLAQUE MORPHOLOGY:                  Heterogeneous          Heterogeneous PLAQUE AMOUNT:                      Mild to moderate       Moderate to severe PLAQUE LOCATION:                    CCA/ICA                CCA/ICA  IMPRESSION: 1. Right internal carotid artery stenosis in the 40%-59% range,     however, velocities may be overestimated due to vessel tortuosity. 2. Left internal carotid artery stenosis present in the 60%-79% range. 3. Vessel tortuosity present in the proximal to mid segments of the     bilateral internal carotid arteries. 4. Patent bilateral external carotid arteries. 5. Patent and antegrade bilateral vertebral arteries. 6. Right internal carotid artery is unchanged and left internal     carotid artery stenosis has increased since previous study on     11/18/2009.  ___________________________________________ Quita Skye. Hart Rochester, M.D.  SH/MEDQ  D:  11/17/2010  T:  11/17/2010  Job:  [id number]

## 2010-12-17 ENCOUNTER — Other Ambulatory Visit: Payer: Self-pay | Admitting: Vascular Surgery

## 2011-05-17 ENCOUNTER — Encounter: Payer: Self-pay | Admitting: Vascular Surgery

## 2011-05-20 ENCOUNTER — Other Ambulatory Visit: Payer: BC Managed Care – PPO

## 2011-05-20 ENCOUNTER — Ambulatory Visit: Payer: BC Managed Care – PPO

## 2011-06-02 ENCOUNTER — Encounter: Payer: Self-pay | Admitting: Neurosurgery

## 2011-06-03 ENCOUNTER — Encounter: Payer: Self-pay | Admitting: Neurosurgery

## 2011-06-03 ENCOUNTER — Other Ambulatory Visit (INDEPENDENT_AMBULATORY_CARE_PROVIDER_SITE_OTHER): Payer: Medicare Other | Admitting: *Deleted

## 2011-06-03 ENCOUNTER — Encounter (INDEPENDENT_AMBULATORY_CARE_PROVIDER_SITE_OTHER): Payer: Medicare Other | Admitting: *Deleted

## 2011-06-03 ENCOUNTER — Ambulatory Visit (INDEPENDENT_AMBULATORY_CARE_PROVIDER_SITE_OTHER): Payer: Medicare Other | Admitting: Neurosurgery

## 2011-06-03 VITALS — BP 118/72 | HR 72 | Resp 14 | Ht 65.0 in | Wt 168.3 lb

## 2011-06-03 DIAGNOSIS — Z48812 Encounter for surgical aftercare following surgery on the circulatory system: Secondary | ICD-10-CM

## 2011-06-03 DIAGNOSIS — I6529 Occlusion and stenosis of unspecified carotid artery: Secondary | ICD-10-CM | POA: Insufficient documentation

## 2011-06-03 DIAGNOSIS — I739 Peripheral vascular disease, unspecified: Secondary | ICD-10-CM

## 2011-06-03 NOTE — Progress Notes (Signed)
[facility] [place] HISTORY AND PHYSICAL   CC: Six-month followup for carotid stenosis as well as lower extremity graft duplex and ABIs Referring Physician: Hart Rochester  History of Present Illness: 71 year old female patient of Dr. Hart Rochester for known carotid stenosis as well as a history of having had a left SFA stent in 2007, a right SFA and popliteal stent in October 2010, and a left popliteal stent in April 2011 which Dr. Myra Gianotti performed. Patient has a history of TIA however she denies any signs or symptoms of TIA, CVA, amaurosis fugax or any neural deficit. Patient does report some pain with walking in her left foot is more of a numbness than pain, but the patient has no complaints of true claudication.  Past Medical History  Diagnosis Date  . Hyperlipidemia   . 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 Stroke   . Hypertension   . Carotid artery occlusion     ROS: [x]  Positive   [ ]  Denies    General: [ ]  Weight loss, [ ]  Fever, [ ]  chills Neurologic: [ ]  Dizziness, [ ]  Blackouts, [ ]  Seizure [ ]  Stroke, [ ]  "Mini stroke", [ ]  Slurred speech, [ ]  Temporary blindness; [ ]  weakness in arms or legs, [ ]  Hoarseness Cardiac: [ ]  Chest pain/pressure, [ ]  Shortness of breath at rest [ ]  Shortness of breath with exertion, [ ]  Atrial fibrillation or irregular heartbeat Vascular: [x ] Pain in legs with walking, [ ]  Pain in legs at rest, [ ]  Pain in legs at night,  [ ]  Non-healing ulcer, [ ]  Blood clot in vein/DVT,   Pulmonary: [ ]  Home oxygen, [ ]  Productive cough, [ ]  Coughing up blood, [ ]  Asthma,  [ ]  Wheezing Musculoskeletal:  [ ]  Arthritis, [ ]  Low back pain, [ ]  Joint pain Hematologic: [ ]  Easy Bruising, [ ]  Anemia; [ ]  Hepatitis Gastrointestinal: [ ]  Blood in stool, [ ]  Gastroesophageal Reflux/heartburn, [ ]  Trouble swallowing Urinary: [ ]  chronic Kidney disease, [ ]  on HD - [ ]  MWF or [ ]   TTHS, [ ]  Burning with urination, [ ]  Difficulty urinating Skin: [ ]  Rashes, [ ]  Wounds Psychological: [ ]  Anxiety, [ ]  Depression   Social History History  Substance Use Topics  . Smoking status: Smoker, Current Status Unknown    Types: Cigarettes  . Smokeless tobacco: Not on file  . Alcohol Use: No    Family History Family History  Problem Relation Age of Onset  . Coronary artery disease Mother   . Coronary artery disease Sister     No Known Allergies  Current Outpatient Prescriptions  Medication Sig Dispense Refill  . aspirin 81 MG tablet Take 81 mg by mouth daily.      . Aspirin-Dipyridamole (AGGRENOX PO) Take by mouth 2 (two) times daily.      Marland Kitchen ibuprofen (ADVIL,MOTRIN) 100 MG chewable tablet Chew 100 mg by mouth as needed.      Marland Kitchen losartan (COZAAR) 50 MG tablet Take 50 mg by mouth Daily.      . Omega-3 Fatty Acids (FISH OIL) 1200 MG CAPS Take by mouth daily.      Marland Kitchen SIMVASTATIN PO Take by mouth daily.      Marland Kitchen telmisartan (MICARDIS) 40 MG tablet Take 40 mg by mouth daily.        Physical Examination  Filed Vitals:   06/03/11 1132  BP: 118/72  Pulse: 72  Resp: 14    Body mass index is 28.01 kg/(m^2).  General:  WDWN in NAD Gait: Normal HEENT: WNL Eyes: Pupils equal Pulmonary: normal non-labored breathing , without Rales, rhonchi,  wheezing Cardiac: RRR, without  Murmurs, rubs or gallops; Abdomen: soft, NT, no masses Skin: no rashes, ulcers noted  Vascular Exam Pulses: Patient has 2+ radial pulses bilaterally, she has palpable femoral pulses as well as dampened PT and DP pulses Carotid bruits: Bilateral carotid pulses to auscultation, no bruits are heard Extremities without ischemic changes, no Gangrene , no cellulitis; no open wounds;  Musculoskeletal: no muscle wasting or atrophy   Neurologic: A&O X 3; Appropriate Affect ; SENSATION: normal; MOTOR FUNCTION:  moving all extremities equally. Speech is fluent/normal  Non-Invasive Vascular Imaging CAROTID  DUPLEX 06/03/2011  Right ICA 40 - 59 % stenosis Left ICA 40 - 59 % stenosis ABIs today are in 0.91 on the right and biphasic, she is 0.74 on the left and monophasic to biphasic. There is some slight velocity elevations proximal to the grafts bilaterally, the greatest velocity is 265 on the right, 282 on the left, the grafts appear to be patent. ASSESSMENT/PLAN: This is a patient with lower extremity disease as well as carotid disease. She appears to be stable she's without any signs of claudication, she is asymptomatic regarding her carotids, patient knows signs and symptoms of CVA and noticed report to the nearest emergency room should this occur. Plan will be to bring the patient back in 6 months for repeat lower extremity ABIs and stent duplex, she will followup in one year regarding her carotids unless she has trouble sooner.  Lauree Chandler ANP   Clinic MD: Darrick Penna

## 2011-06-04 NOTE — Progress Notes (Signed)
Addended by: Sharee Pimple on: 06/04/2011 08:38 AM   Modules accepted: Orders

## 2011-06-04 NOTE — Procedures (Unsigned)
LOWER EXTREMITY ARTERIAL DUPLEX  INDICATION:  Followup lower extremity stent placement.  HISTORY: Diabetes:  No Cardiac:  No Hypertension:  Yes Smoking:  Current Previous Surgery:  Left SFA stent 03/11/2005, right SFA and popliteal artery stent 10/17/2008, left popliteal artery stent 04/30/2009.  SINGLE LEVEL ARTERIAL EXAM                         RIGHT                LEFT Brachial: Anterior tibial: Posterior tibial: Peroneal: Ankle/Brachial Index:   0.91                 0.74.  LOWER EXTREMITY ARTERIAL DUPLEX EXAM  PREVIOUS ABI:  11/17/2010 right 0.93, left 0.87  DUPLEX:  Elevated velocities suggesting stenosis present involving the right distal common femoral, proximal right superficial femoral, and the left profunda femoral and left proximal SFA arteries.  Elevated velocities present at the left SFA stent distal anastomosis and left popliteal artery stenosis proximal anastomosis which may be suggestive of stenosis.  Patent bilateral SFA stents and left popliteal artery stent with elevated velocities that are shown on the following worksheet.  IMPRESSION: 1. Right ABIs are suggestive of mild arterial disease and remains     stable in comparison to the left exam. 2. Left ABIs are suggestive of moderate arterial disease and have     declined in comparison to the last study.  ___________________________________________ [name], M.D.  EM/MEDQ  D:  06/04/2011  T:  06/04/2011  Job:  [id number]

## 2011-06-14 NOTE — Procedures (Unsigned)
CAROTID DUPLEX EXAM  INDICATION:  Carotid disease.  HISTORY: Diabetes:  No Cardiac:  No Hypertension:  Yes Smoking:  Current Previous Surgery:  No carotid intervention CV History:  History of TIA, currently asymptomatic. Amaurosis Fugax No, Paresthesias No, Hemiparesis No                                      RIGHT             LEFT Brachial systolic pressure:         145               140 Brachial Doppler waveforms:         Normal            Normal Vertebral direction of flow:        Antegrade         Antegrade DUPLEX VELOCITIES (cm/sec) CCA peak systolic                   54                61 ECA peak systolic                   101               115 ICA peak systolic                   124               154 ICA end diastolic                   44                53 PLAQUE MORPHOLOGY:                  Heterogenous      Heterogenous PLAQUE AMOUNT:                      Minimal           Moderate PLAQUE LOCATION:                    CCA, ICA          CCA, ICA  IMPRESSION: 1. Right ICA velocity suggests 40% to 59% stenosis, however,     velocities may be overestimated due to vessel tortuosity. 2. Left ICA velocity suggest 40% to 59% stenosis. 3. Tortuous vessels bilaterally. 4. Antegrade vertebral arteries bilaterally.  ___________________________________________ Quita Skye Hart Rochester, M.D.  EM/MEDQ  D:  06/04/2011  T:  06/04/2011  Job:  [id number]

## 2011-09-13 ENCOUNTER — Telehealth: Payer: Self-pay

## 2011-09-13 NOTE — Telephone Encounter (Signed)
Phone call from pt. to report a 1-2 week history of "pain and numbness in right great toe and ball of right foot, when on feet a lot."  Denies any other symptoms at this time. Has f/u appt. in December, and stated she felt she needs to get in for evaluation sooner.  Discussed w/ Dr. Hart Rochester.  Recommened to schedule pt. for bilateral duplex of lower extemities/ ABI's, and office visit.

## 2011-09-14 ENCOUNTER — Encounter (INDEPENDENT_AMBULATORY_CARE_PROVIDER_SITE_OTHER): Payer: Medicare Other | Admitting: *Deleted

## 2011-09-14 ENCOUNTER — Ambulatory Visit (INDEPENDENT_AMBULATORY_CARE_PROVIDER_SITE_OTHER): Payer: Medicare Other | Admitting: Vascular Surgery

## 2011-09-14 ENCOUNTER — Other Ambulatory Visit: Payer: Self-pay | Admitting: *Deleted

## 2011-09-14 ENCOUNTER — Encounter: Payer: Self-pay | Admitting: Vascular Surgery

## 2011-09-14 VITALS — BP 138/54 | HR 73 | Temp 97.9°F | Ht 65.0 in | Wt 166.0 lb

## 2011-09-14 DIAGNOSIS — Z48812 Encounter for surgical aftercare following surgery on the circulatory system: Secondary | ICD-10-CM

## 2011-09-14 DIAGNOSIS — I739 Peripheral vascular disease, unspecified: Secondary | ICD-10-CM

## 2011-09-14 DIAGNOSIS — I6529 Occlusion and stenosis of unspecified carotid artery: Secondary | ICD-10-CM

## 2011-09-14 NOTE — Addendum Note (Signed)
Addended by: Sharee Pimple on: 09/14/2011 12:50 PM   Modules accepted: Orders

## 2011-09-14 NOTE — Progress Notes (Signed)
Subjective:     Patient ID: [name], female   DOB: 10-09-1940, 71 y.o.   MRN: [id number]  HPI this 71 year old female is seen today because of some recent numbness and aching in her right first toe. She has a history of bilateral superficial femoral artery stents placed by me in 2007 and a left popliteal stent and dilatation performed by Dr. Myra Gianotti few years ago. She has been having very minimal claudication symptoms in both calves which are equal on the right than the left with no rest pain or history of nonhealing ulcers infection or gangrene. Over the last few weeks she's noticed some aching and numbness in the right first toe. She takes aspirin daily.  Past Medical History  Diagnosis Date  . Hyperlipidemia   . 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 Stroke   . Hypertension   . Carotid artery occlusion   . Diabetes mellitus     History  Substance Use Topics  . Smoking status: Smoker, Current Status Unknown    Types: Cigarettes  . Smokeless tobacco: Never Used   Comment: pt states she smokes about 4-5 cigs per day and is trying to quit  . Alcohol Use: No    Family History  Problem Relation Age of Onset  . Coronary artery disease Mother   . Heart disease Mother   . Heart attack Mother   . Coronary artery disease Sister   . Heart disease Father   . Other Father     amputation    No Known Allergies  Current outpatient prescriptions:aspirin 81 MG tablet, Take 81 mg by mouth daily., Disp: , Rfl: ;  Aspirin-Dipyridamole (AGGRENOX PO), Take by mouth 2 (two) times daily., Disp: , Rfl: ;  ibuprofen (ADVIL,MOTRIN) 100 MG chewable tablet, Chew 100 mg by mouth as needed., Disp: , Rfl: ;  losartan (COZAAR) 50 MG tablet, Take 50 mg by mouth Daily., Disp: , Rfl: ;  Omega-3 Fatty Acids (FISH OIL) 1200 MG CAPS, Take by mouth daily., Disp: , Rfl:  SIMVASTATIN PO, Take by mouth daily., Disp: , Rfl: ;   telmisartan (MICARDIS) 40 MG tablet, Take 40 mg by mouth daily., Disp: , Rfl:   BP 138/54  Pulse 73  Temp 97.9 F (36.6 C) (Oral)  Ht 5\' 5"  (1.651 m)  Wt 166 lb (75.297 kg)  BMI 27.62 kg/m2  SpO2 100%  Body mass index is 27.62 kg/(m^2).          Review of Systems denies chest pain, dyspnea on exertion, PND, orthopnea, lateralizing weakness, aphasia, amaurosis fugax, diplopia, blurred vision, syncope.    Objective:   Physical Exam blood pressure 130/54 heart rate 77 respirations 16 Gen.-alert and oriented x3 in no apparent distress HEENT normal for age Lungs no rhonchi or wheezing Cardiovascular regular rhythm no murmurs carotid pulses 3+ palpable no bruits audible Abdomen soft nontender no palpable masses Musculoskeletal free of  major deformities Skin clear -no rashes Neurologic normal Lower extremities 3+ femoral and 2+ popliteal pulses bilaterally. Right leg with 2+ dorsalis pedis left leg with 2+ posterior tibial pulse palpable. Both feet warm and well-perfused. No evidence of embolic phenomena to right first toe with ulceration regardless of skin  Today I ordered lower extremity duplex scan of her arteries and ABIs. ABI on the right is 0.93 and on the left is 0.81. These are very similar to her most recent pressures last year. She  has good biphasic waveforms in both feet. Duplex scan reveals no evidence of severe stenosis within the stents.       Assessment:     Doing well post bilateral SFA stents and left popliteal stent. Current symptoms of right toe numbness and aching unrelated to vascular status    Plan:     Return in 6 months for carotid duplex exam and repeat scans bilateral lower extremities with ABI's and see nurse practitioner

## 2011-12-10 ENCOUNTER — Ambulatory Visit: Payer: Medicare Other | Admitting: Neurosurgery

## 2012-03-21 ENCOUNTER — Ambulatory Visit: Payer: Medicare Other | Admitting: Neurosurgery

## 2012-03-21 ENCOUNTER — Other Ambulatory Visit (INDEPENDENT_AMBULATORY_CARE_PROVIDER_SITE_OTHER): Payer: Medicare Other | Admitting: *Deleted

## 2012-03-21 ENCOUNTER — Encounter (INDEPENDENT_AMBULATORY_CARE_PROVIDER_SITE_OTHER): Payer: Medicare Other | Admitting: *Deleted

## 2012-03-21 DIAGNOSIS — I739 Peripheral vascular disease, unspecified: Secondary | ICD-10-CM

## 2012-03-21 DIAGNOSIS — I6529 Occlusion and stenosis of unspecified carotid artery: Secondary | ICD-10-CM

## 2012-03-21 DIAGNOSIS — Z48812 Encounter for surgical aftercare following surgery on the circulatory system: Secondary | ICD-10-CM

## 2012-06-02 ENCOUNTER — Other Ambulatory Visit: Payer: Medicare Other

## 2012-06-02 ENCOUNTER — Ambulatory Visit: Payer: Medicare Other | Admitting: Neurosurgery

## 2012-08-01 ENCOUNTER — Other Ambulatory Visit: Payer: Self-pay | Admitting: *Deleted

## 2012-08-01 DIAGNOSIS — I739 Peripheral vascular disease, unspecified: Secondary | ICD-10-CM

## 2012-08-01 DIAGNOSIS — Z48812 Encounter for surgical aftercare following surgery on the circulatory system: Secondary | ICD-10-CM

## 2012-08-03 ENCOUNTER — Encounter: Payer: Self-pay | Admitting: Vascular Surgery

## 2012-08-24 ENCOUNTER — Telehealth: Payer: Self-pay

## 2012-08-24 NOTE — Telephone Encounter (Signed)
Phone call from pt.  Reports having increase in "left calf cramping with walking";  states it improves with rest.  Denies rest pain, swelling of (L) LE, open sores, or change in color/ temperature of left LE.  Next scheduled appt. Is 03/2013.  Advised will move appt. Up, to evaluate PVD, to an earlier time.  Advised she will be notified by a scheduler in our office with appt.  Verb. Understanding.

## 2012-09-18 ENCOUNTER — Encounter: Payer: Self-pay | Admitting: Vascular Surgery

## 2012-09-19 ENCOUNTER — Ambulatory Visit (INDEPENDENT_AMBULATORY_CARE_PROVIDER_SITE_OTHER): Payer: Medicare Other | Admitting: *Deleted

## 2012-09-19 ENCOUNTER — Encounter (INDEPENDENT_AMBULATORY_CARE_PROVIDER_SITE_OTHER): Payer: Medicare Other | Admitting: *Deleted

## 2012-09-19 ENCOUNTER — Encounter: Payer: Self-pay | Admitting: Vascular Surgery

## 2012-09-19 ENCOUNTER — Other Ambulatory Visit: Payer: Self-pay | Admitting: *Deleted

## 2012-09-19 ENCOUNTER — Ambulatory Visit (INDEPENDENT_AMBULATORY_CARE_PROVIDER_SITE_OTHER): Payer: Medicare Other | Admitting: Vascular Surgery

## 2012-09-19 ENCOUNTER — Encounter: Payer: Self-pay | Admitting: *Deleted

## 2012-09-19 VITALS — BP 108/89 | HR 93 | Resp 16 | Ht 65.0 in | Wt 159.0 lb

## 2012-09-19 DIAGNOSIS — Z48812 Encounter for surgical aftercare following surgery on the circulatory system: Secondary | ICD-10-CM

## 2012-09-19 DIAGNOSIS — I739 Peripheral vascular disease, unspecified: Secondary | ICD-10-CM

## 2012-09-19 NOTE — Progress Notes (Signed)
Subjective:     Patient ID: [name], female   DOB: 10/01/1940, 72 y.o.   MRN: [id number]  HPI this 72 year old female is evaluated for severe left calf claudication symptoms. She has previously undergone stenting of her left SFA by me in 2007 and eventually had PTA of her left popliteal artery by Dr. Myra Gianotti in 2011. She also has had a stent placed in a right SFA. She currently is only able to walk about 1-200 feet before having to stop because of severe calf discomfort and numbness in the left foot and must wait 10 minutes before resuming. She has no rest pain or history of nonhealing ulcers.  Past Medical History  Diagnosis Date  . Hyperlipidemia   . 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 Stroke   . Hypertension   . Carotid artery occlusion   . Diabetes mellitus     History  Substance Use Topics  . Smoking status: Current Some Day Smoker    Types: Cigarettes  . Smokeless tobacco: Never Used     Comment: pt states she smokes about 4-5 cigs per day and is trying to quit  . Alcohol Use: No    Family History  Problem Relation Age of Onset  . Coronary artery disease Mother   . Heart disease Mother   . Heart attack Mother   . Coronary artery disease Sister   . Heart disease Sister   . Heart disease Father   . Other Father     amputation    No Known Allergies  Current outpatient prescriptions:clopidogrel (PLAVIX) 75 MG tablet, Take 75 mg by mouth daily., Disp: , Rfl: ;  ibuprofen (ADVIL,MOTRIN) 100 MG chewable tablet, Chew 100 mg by mouth as needed., Disp: , Rfl: ;  losartan (COZAAR) 50 MG tablet, Take 50 mg by mouth Daily., Disp: , Rfl: ;  Omega-3 Fatty Acids (FISH OIL) 1200 MG CAPS, Take by mouth daily., Disp: , Rfl: ;  SIMVASTATIN PO, Take by mouth daily., Disp: , Rfl:  telmisartan (MICARDIS) 40 MG tablet, Take 40 mg by mouth daily., Disp: , Rfl: ;  aspirin 81 MG tablet, Take 81 mg by mouth  daily., Disp: , Rfl: ;  Aspirin-Dipyridamole (AGGRENOX PO), Take by mouth 2 (two) times daily., Disp: , Rfl:   BP 108/89  Pulse 93  Resp 16  Ht 5\' 5"  (1.651 m)  Wt 159 lb (72.122 kg)  BMI 26.46 kg/m2  Body mass index is 26.46 kg/(m^2).         Review of Systems chest pain, dyspnea on exertion, PND, orthopnea, hemoptysis, lateralizing weakness, aphasia, amaurosis fugax. All other systems are negative and a complete review of systems patient continues to admit to at least 5 cigarettes per day and has smoked one pack of cigarettes per day for 55 years    Objective:   Physical Exam BP 108/89  Pulse 93  Resp 16  Ht 5\' 5"  (1.651 m)  Wt 159 lb (72.122 kg)  BMI 26.46 kg/m2  Gen.-alert and oriented x3 in no apparent distress HEENT normal for age Lungs no rhonchi or wheezing Cardiovascular regular rhythm no murmurs carotid pulses 3+ palpable no bruits audible Abdomen soft nontender no palpable masses Musculoskeletal free of  major deformities Skin clear -no rashes Neurologic normal Lower extremities 3+ femoral and dorsalis pedis pulse palpable on right 3+ femoral and absent popliteal and distal pulses on left. No evidence of  severe ischemia or ulceration noted.  Today I ordered lower extremity arterial Dopplers with ABIs in duplex scan of both lower extremities. Left leg is total occlusion of the left SFA stent with patency of the popliteal artery above the day. ABI on the left is 0.43. Right leg has some stenosis in the right SFA stent but continues to have biphasic flow distally with ABI on the right of 0.89.       Assessment:     Left SFA stent occlusion about one month ago by history with severe claudication left leg Stenosis within right SFA stent-asymptomatic  Ongoing tobacco abuse    Plan:     Plan angiography of both lower extremities with attempt to reopen left SFA stent. If unsuccessful we'll need left femoral-popliteal bypass graft if feasible. Also we'll need to  evaluate to see if stenosis in right SFA stent needs PTA Scheduled procedure for Tuesday, September 30 per Dr. Myra Gianotti

## 2012-09-22 ENCOUNTER — Encounter (HOSPITAL_COMMUNITY): Payer: Self-pay | Admitting: Pharmacy Technician

## 2012-10-03 ENCOUNTER — Ambulatory Visit (HOSPITAL_COMMUNITY)
Admission: RE | Admit: 2012-10-03 | Discharge: 2012-10-03 | Disposition: A | Discharge status: Home / Self Care | Payer: Medicare Other | Source: Ambulatory Visit | Attending: Surgery | Admitting: Surgery

## 2012-10-03 ENCOUNTER — Encounter (HOSPITAL_COMMUNITY)
Admission: RE | Disposition: A | Discharge status: Home / Self Care | Payer: Self-pay | Source: Ambulatory Visit | Attending: Surgery

## 2012-10-03 ENCOUNTER — Other Ambulatory Visit: Payer: Self-pay | Admitting: *Deleted

## 2012-10-03 ENCOUNTER — Telehealth: Payer: Self-pay | Admitting: Vascular Surgery

## 2012-10-03 DIAGNOSIS — Z01810 Encounter for preprocedural cardiovascular examination: Secondary | ICD-10-CM

## 2012-10-03 DIAGNOSIS — E119 Type 2 diabetes mellitus without complications: Secondary | ICD-10-CM | POA: Insufficient documentation

## 2012-10-03 DIAGNOSIS — E785 Hyperlipidemia, unspecified: Secondary | ICD-10-CM | POA: Insufficient documentation

## 2012-10-03 DIAGNOSIS — I739 Peripheral vascular disease, unspecified: Secondary | ICD-10-CM

## 2012-10-03 DIAGNOSIS — T82898A Other specified complication of vascular prosthetic devices, implants and grafts, initial encounter: Secondary | ICD-10-CM | POA: Insufficient documentation

## 2012-10-03 DIAGNOSIS — Y831 Surgical operation with implant of artificial internal device as the cause of abnormal reaction of the patient, or of later complication, without mention of misadventure at the time of the procedure: Secondary | ICD-10-CM | POA: Insufficient documentation

## 2012-10-03 DIAGNOSIS — I708 Atherosclerosis of other arteries: Secondary | ICD-10-CM | POA: Insufficient documentation

## 2012-10-03 DIAGNOSIS — I70219 Atherosclerosis of native arteries of extremities with intermittent claudication, unspecified extremity: Secondary | ICD-10-CM

## 2012-10-03 DIAGNOSIS — I10 Essential (primary) hypertension: Secondary | ICD-10-CM | POA: Insufficient documentation

## 2012-10-03 DIAGNOSIS — F17200 Nicotine dependence, unspecified, uncomplicated: Secondary | ICD-10-CM | POA: Insufficient documentation

## 2012-10-03 HISTORY — PX: ABDOMINAL AORTAGRAM: SHX5454

## 2012-10-03 HISTORY — PX: LOWER EXTREMITY ANGIOGRAM: SHX5508

## 2012-10-03 LAB — POCT I-STAT, CHEM 8
BUN: 15 mg/dL (ref 6–23)
Calcium, Ion: 1.2 mmol/L (ref 1.13–1.30)
Creatinine, Ser: 1 mg/dL (ref 0.50–1.10)
Glucose, Bld: 127 mg/dL — ABNORMAL HIGH (ref 70–99)
Hemoglobin: 15 g/dL (ref 12.0–15.0)
Potassium: 3.8 mEq/L (ref 3.5–5.1)
Sodium: 145 mEq/L (ref 135–145)
TCO2: 22 mmol/L (ref 0–100)

## 2012-10-03 SURGERY — ABDOMINAL AORTAGRAM
Anesthesia: LOCAL

## 2012-10-03 MED ORDER — HEPARIN (PORCINE) IN NACL 2-0.9 UNIT/ML-% IJ SOLN
INTRAMUSCULAR | Status: AC
  Filled 2012-10-03: qty 1000

## 2012-10-03 MED ORDER — FENTANYL CITRATE 0.05 MG/ML IJ SOLN
INTRAMUSCULAR | Status: AC
  Filled 2012-10-03: qty 2

## 2012-10-03 MED ORDER — ALUM & MAG HYDROXIDE-SIMETH 200-200-20 MG/5ML PO SUSP: 15.0000 mL | ORAL | Status: DC | PRN

## 2012-10-03 MED ORDER — SODIUM CHLORIDE 0.9 % IV SOLN: 1.0000 mL/kg/h | INTRAVENOUS | Status: DC

## 2012-10-03 MED ORDER — METOPROLOL TARTRATE 1 MG/ML IV SOLN: 2.0000 mg | INTRAVENOUS | Status: DC | PRN

## 2012-10-03 MED ORDER — ONDANSETRON HCL 4 MG/2ML IJ SOLN: 4.0000 mg | Freq: Four times a day (QID) | INTRAMUSCULAR | Status: DC | PRN

## 2012-10-03 MED ORDER — GUAIFENESIN-DM 100-10 MG/5ML PO SYRP: 15.0000 mL | ORAL_SOLUTION | ORAL | Status: DC | PRN

## 2012-10-03 MED ORDER — SODIUM CHLORIDE 0.9 % IV SOLN
INTRAVENOUS | Status: DC
  Administered 2012-10-03: 1000 mL via INTRAVENOUS

## 2012-10-03 MED ORDER — LIDOCAINE HCL (PF) 1 % IJ SOLN
INTRAMUSCULAR | Status: AC
  Filled 2012-10-03: qty 30

## 2012-10-03 MED ORDER — OXYCODONE HCL 5 MG PO TABS: 5.0000 mg | ORAL_TABLET | ORAL | Status: DC | PRN

## 2012-10-03 MED ORDER — PHENOL 1.4 % MT LIQD: 1.0000 | OROMUCOSAL | Status: DC | PRN

## 2012-10-03 MED ORDER — ACETAMINOPHEN 325 MG PO TABS: 325.0000 mg | ORAL_TABLET | ORAL | Status: DC | PRN

## 2012-10-03 MED ORDER — HYDRALAZINE HCL 20 MG/ML IJ SOLN: 10.0000 mg | INTRAMUSCULAR | Status: DC | PRN

## 2012-10-03 MED ORDER — LABETALOL HCL 5 MG/ML IV SOLN: 10.0000 mg | INTRAVENOUS | Status: DC | PRN

## 2012-10-03 MED ORDER — ACETAMINOPHEN 325 MG RE SUPP: 325.0000 mg | RECTAL | Status: DC | PRN

## 2012-10-03 NOTE — H&P (View-Only) (Signed)
Subjective:     Patient ID: [name], female   DOB: 04-19-1940, 72 y.o.   MRN: [id number]  HPI this 72 year old female is evaluated for severe left calf claudication symptoms. She has previously undergone stenting of her left SFA by me in 2007 and eventually had PTA of her left popliteal artery by Dr. Myra Gianotti in 2011. She also has had a stent placed in a right SFA. She currently is only able to walk about 1-200 feet before having to stop because of severe calf discomfort and numbness in the left foot and must wait 10 minutes before resuming. She has no rest pain or history of nonhealing ulcers.  Past Medical History  Diagnosis Date  . Hyperlipidemia   . 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 Stroke   . Hypertension   . Carotid artery occlusion   . Diabetes mellitus     History  Substance Use Topics  . Smoking status: Current Some Day Smoker    Types: Cigarettes  . Smokeless tobacco: Never Used     Comment: pt states she smokes about 4-5 cigs per day and is trying to quit  . Alcohol Use: No    Family History  Problem Relation Age of Onset  . Coronary artery disease Mother   . Heart disease Mother   . Heart attack Mother   . Coronary artery disease Sister   . Heart disease Sister   . Heart disease Father   . Other Father     amputation    No Known Allergies  Current outpatient prescriptions:clopidogrel (PLAVIX) 75 MG tablet, Take 75 mg by mouth daily., Disp: , Rfl: ;  ibuprofen (ADVIL,MOTRIN) 100 MG chewable tablet, Chew 100 mg by mouth as needed., Disp: , Rfl: ;  losartan (COZAAR) 50 MG tablet, Take 50 mg by mouth Daily., Disp: , Rfl: ;  Omega-3 Fatty Acids (FISH OIL) 1200 MG CAPS, Take by mouth daily., Disp: , Rfl: ;  SIMVASTATIN PO, Take by mouth daily., Disp: , Rfl:  telmisartan (MICARDIS) 40 MG tablet, Take 40 mg by mouth daily., Disp: , Rfl: ;  aspirin 81 MG tablet, Take 81 mg by mouth  daily., Disp: , Rfl: ;  Aspirin-Dipyridamole (AGGRENOX PO), Take by mouth 2 (two) times daily., Disp: , Rfl:   BP 108/89  Pulse 93  Resp 16  Ht 5\' 5"  (1.651 m)  Wt 159 lb (72.122 kg)  BMI 26.46 kg/m2  Body mass index is 26.46 kg/(m^2).         Review of Systems chest pain, dyspnea on exertion, PND, orthopnea, hemoptysis, lateralizing weakness, aphasia, amaurosis fugax. All other systems are negative and a complete review of systems patient continues to admit to at least 5 cigarettes per day and has smoked one pack of cigarettes per day for 55 years    Objective:   Physical Exam BP 108/89  Pulse 93  Resp 16  Ht 5\' 5"  (1.651 m)  Wt 159 lb (72.122 kg)  BMI 26.46 kg/m2  Gen.-alert and oriented x3 in no apparent distress HEENT normal for age Lungs no rhonchi or wheezing Cardiovascular regular rhythm no murmurs carotid pulses 3+ palpable no bruits audible Abdomen soft nontender no palpable masses Musculoskeletal free of  major deformities Skin clear -no rashes Neurologic normal Lower extremities 3+ femoral and dorsalis pedis pulse palpable on right 3+ femoral and absent popliteal and distal pulses on left. No evidence of  severe ischemia or ulceration noted.  Today I ordered lower extremity arterial Dopplers with ABIs in duplex scan of both lower extremities. Left leg is total occlusion of the left SFA stent with patency of the popliteal artery above the day. ABI on the left is 0.43. Right leg has some stenosis in the right SFA stent but continues to have biphasic flow distally with ABI on the right of 0.89.       Assessment:     Left SFA stent occlusion about one month ago by history with severe claudication left leg Stenosis within right SFA stent-asymptomatic  Ongoing tobacco abuse    Plan:     Plan angiography of both lower extremities with attempt to reopen left SFA stent. If unsuccessful we'll need left femoral-popliteal bypass graft if feasible. Also we'll need to  evaluate to see if stenosis in right SFA stent needs PTA Scheduled procedure for Tuesday, September 30 per Dr. Myra Gianotti

## 2012-10-03 NOTE — Telephone Encounter (Signed)
Message copied by Margaretmary Eddy on Tue Oct 03, 2012  1:40 PM ------      Message from: Sharee Pimple      Created: Tue Oct 03, 2012 10:53 AM      Regarding: schedule                   ----- Message -----         From: Melene Plan, RN         Sent: 10/03/2012  10:02 AM           To: Sharee Pimple, CMA, [facility]                        ----- Message -----         From: Nada Libman, MD         Sent: 10/03/2012   8:42 AM           To: Reuel Derby, Melene Plan, RN            10/03/2012:                  Surgeon:  Jorge Ny      Procedure Performed:       1.  ultrasound-guided access, right femoral artery        2.  abdominal aortogram       3.  second order catheterization       4.  bilateral lower extremity runoff                  Please schedule the patient to followup with Dr. Hart Rochester next week. She will need vein mapping prior to her visit ------

## 2012-10-03 NOTE — Telephone Encounter (Signed)
LVM re appt info, sent letter - kf °

## 2012-10-03 NOTE — Op Note (Signed)
[facility]  Patient name: [name] MRN: [id number] DOB: 1940/06/06 Sex: female  10/03/2012 Pre-operative Diagnosis: Bilateral claudication, left greater than right Post-operative diagnosis:  Same Surgeon:  Jorge Ny Procedure Performed:  1.  ultrasound-guided access, right femoral artery   2.  abdominal aortogram  3.  second order catheterization  4.  bilateral lower extremity runoff   Indications:  The patient has a history of bilateral lower extremity stenting for claudication. She has developed progressive symptoms on the left leg for the past month. Ultrasound revealed an occluded stent. She comes in for further evaluation  Procedure:  The patient was identified in the holding area and taken to room 8.  The patient was then placed supine on the table and prepped and draped in the usual sterile fashion.  A time out was called.  Ultrasound was used to evaluate the right common femoral artery.  It was patent .  A digital ultrasound image was acquired.  A micropuncture needle was used to access the right common femoral artery under ultrasound guidance.  An 018 wire was advanced without resistance and a micropuncture sheath was placed.  The 018 wire was removed and a benson wire was placed.  The micropuncture sheath was exchanged for a 5 french sheath.  An omniflush catheter was advanced over the wire to the level of L-1.  An abdominal angiogram was obtained.  Next, using the omniflush catheter and a benson wire, the aortic bifurcation was crossed and the catheter was placed into theleft external iliac artery and left runoff was obtained.  right runoff was performed via retrograde sheath injections.  Findings:   Aortogram:  No significant suprarenal disease is identified. Multiple left renal arteries are identified. No significant renal artery stenosis is identified, however the ostium of bilateral renal arteries was not well visualized. The  infrarenal abdominal aorta is calcified without identifiable stenosis.  Right Lower Extremity: The right common femoral artery is patent throughout its course as is the profunda femoral artery. The superficial femoral artery is patent. There is a stent present within the midportion with approximately 40-50% stenosis. The popliteal artery is patent throughout it's course. There is single vessel runoff via the peroneal artery which reconstitutes a pedal artery at the ankle  Left Lower Extremity:  The left common femoral artery is patent throughout it's course as is the profunda femoral artery. There is diffuse disease throughout the left superficial femoral artery. A stent within the mid superficial femoral artery is occluded. There is reconstitution just beyond the stent. A second stent within the popliteal artery is identified and patent. The popliteal artery is patent below the knee. There is 2 vessel runoff via the anterior tibial and peroneal artery  Intervention:  None  Impression:  #1  occluded left superficial femoral artery stent. Surgical reconstruction is favored.  #2  approximately 40-50% stenosis within the right superficial femoral artery stent  #3  approximately 50% right common iliac stenosis    V. [name], M.D. [facility] Office: [phone] Pager:  [phone]

## 2012-10-03 NOTE — Interval H&P Note (Signed)
History and Physical Interval Note:  10/03/2012 7:48 AM  [name]  has presented today for surgery, with the diagnosis of PVD  The various methods of treatment have been discussed with the patient and family. After consideration of risks, benefits and other options for treatment, the patient has consented to  Procedure(s): ABDOMINAL AORTAGRAM (N/A) as a surgical intervention .  The patient's history has been reviewed, patient examined, no change in status, stable for surgery.  I have reviewed the patient's chart and labs.  Questions were answered to the patient's satisfaction.     BRABHAM IV, V. WELLS

## 2012-10-04 ENCOUNTER — Encounter: Payer: Self-pay | Admitting: *Deleted

## 2012-10-04 ENCOUNTER — Other Ambulatory Visit: Payer: Self-pay | Admitting: *Deleted

## 2012-10-06 ENCOUNTER — Encounter (HOSPITAL_COMMUNITY): Payer: Self-pay | Admitting: Respiratory Therapy

## 2012-10-09 ENCOUNTER — Encounter: Payer: Self-pay | Admitting: Vascular Surgery

## 2012-10-10 ENCOUNTER — Ambulatory Visit (INDEPENDENT_AMBULATORY_CARE_PROVIDER_SITE_OTHER): Payer: Medicare Other | Admitting: Vascular Surgery

## 2012-10-10 ENCOUNTER — Encounter (HOSPITAL_COMMUNITY)
Admission: RE | Admit: 2012-10-10 | Discharge: 2012-10-10 | Disposition: A | Discharge status: Home / Self Care | Payer: Medicare Other | Source: Ambulatory Visit | Attending: Vascular Surgery | Admitting: Vascular Surgery

## 2012-10-10 ENCOUNTER — Encounter (HOSPITAL_COMMUNITY)
Admission: RE | Admit: 2012-10-10 | Discharge: 2012-10-10 | Disposition: A | Discharge status: Home / Self Care | Payer: Medicare Other | Source: Ambulatory Visit | Attending: Anesthesiology | Admitting: Anesthesiology

## 2012-10-10 ENCOUNTER — Encounter: Payer: Self-pay | Admitting: Vascular Surgery

## 2012-10-10 ENCOUNTER — Encounter (HOSPITAL_COMMUNITY): Payer: Self-pay

## 2012-10-10 ENCOUNTER — Ambulatory Visit (HOSPITAL_COMMUNITY)
Admit: 2012-10-10 | Discharge: 2012-10-10 | Disposition: A | Discharge status: Home / Self Care | Payer: Medicare Other | Source: Ambulatory Visit | Attending: Vascular Surgery | Admitting: Vascular Surgery

## 2012-10-10 VITALS — BP 145/74 | HR 80 | Resp 16 | Ht 65.0 in | Wt 160.0 lb

## 2012-10-10 DIAGNOSIS — I739 Peripheral vascular disease, unspecified: Secondary | ICD-10-CM | POA: Insufficient documentation

## 2012-10-10 DIAGNOSIS — Z01810 Encounter for preprocedural cardiovascular examination: Secondary | ICD-10-CM | POA: Insufficient documentation

## 2012-10-10 LAB — CBC
HCT: 44.8 % (ref 36.0–46.0)
Hemoglobin: 14.5 g/dL (ref 12.0–15.0)
MCHC: 32.4 g/dL (ref 30.0–36.0)
Platelets: 202 10*3/uL (ref 150–400)
RBC: 4.78 MIL/uL (ref 3.87–5.11)
WBC: 8.2 10*3/uL (ref 4.0–10.5)

## 2012-10-10 LAB — SURGICAL PCR SCREEN
MRSA, PCR: NEGATIVE
Staphylococcus aureus: NEGATIVE

## 2012-10-10 LAB — COMPREHENSIVE METABOLIC PANEL
ALT: 12 U/L (ref 0–35)
AST: 15 U/L (ref 0–37)
Alkaline Phosphatase: 69 U/L (ref 39–117)
BUN: 20 mg/dL (ref 6–23)
Chloride: 106 mEq/L (ref 96–112)
GFR calc Af Amer: 71 mL/min — ABNORMAL LOW (ref >90)
Glucose, Bld: 78 mg/dL (ref 70–99)
Potassium: 4.1 mEq/L (ref 3.5–5.1)
Sodium: 142 mEq/L (ref 135–145)
Total Bilirubin: 0.3 mg/dL (ref 0.3–1.2)
Total Protein: 7.7 g/dL (ref 6.0–8.3)

## 2012-10-10 LAB — URINALYSIS, ROUTINE W REFLEX MICROSCOPIC
Glucose, UA: NEGATIVE mg/dL
Nitrite: POSITIVE — AB
Protein, ur: NEGATIVE mg/dL
Urobilinogen, UA: 0.2 mg/dL (ref 0.0–1.0)

## 2012-10-10 LAB — ABO/RH: ABO/RH(D): O POS

## 2012-10-10 LAB — APTT: aPTT: 33 seconds (ref 24–37)

## 2012-10-10 LAB — PREPARE RBC (CROSSMATCH)

## 2012-10-10 NOTE — Progress Notes (Signed)
Subjective:     Patient ID: [name], female   DOB: 10/10/1940, 72 y.o.   MRN: [id number]  HPI this 72-year-old female returns today to discuss left femoral-popliteal bypass grafting. She has limiting claudication of the left leg mainly in the calf after walking about one half block. She was then rest for 5 minutes before proceeding. She has had previous stents in both superficial femoral arteries and recent angiogram by Dr. Brabham confirmed occlusion of the left SFA with patency of the below knee popliteal artery and two-vessel runoff.  Past Medical History  Diagnosis Date  . Hyperlipidemia   . 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 Stroke   . Hypertension   . Carotid artery occlusion   . Diabetes mellitus     History  Substance Use Topics  . Smoking status: Current Some Day Smoker    Types: Cigarettes  . Smokeless tobacco: Never Used     Comment: pt states she smokes about 4-5 cigs per day and is trying to quit  . Alcohol Use: No    Family History  Problem Relation Age of Onset  . Coronary artery disease Mother   . Heart disease Mother   . Heart attack Mother   . Coronary artery disease Sister   . Heart disease Sister   . Heart disease Father   . Other Father     amputation    No Known Allergies  Current outpatient prescriptions:aspirin 81 MG tablet, Take 81 mg by mouth daily., Disp: , Rfl: ;  clopidogrel (PLAVIX) 75 MG tablet, Take 75 mg by mouth daily., Disp: , Rfl: ;  ibuprofen (ADVIL) 200 MG tablet, Take 400 mg by mouth daily as needed for pain., Disp: , Rfl: ;  losartan (COZAAR) 50 MG tablet, Take 50 mg by mouth Daily., Disp: , Rfl: ;  simvastatin (ZOCOR) 80 MG tablet, Take 80 mg by mouth at bedtime., Disp: , Rfl:   BP 145/74  Pulse 80  Resp 16  Ht 5' 5" (1.651 m)  Wt 160 lb (72.576 kg)  BMI 26.63 kg/m2  Body mass index is 26.63 kg/(m^2).           Review of Systems  denies chest pain, dyspnea on exertion, PND, orthopnea, hemoptysis.     Objective:   Physical Exam BP 145/74  Pulse 80  Resp 16  Ht 5' 5" (1.651 m)  Wt 160 lb (72.576 kg)  BMI 26.63 kg/m2  Gen.-alert and oriented x3 in no apparent distress HEENT normal for age Lungs no rhonchi or wheezing Cardiovascular regular rhythm no murmurs carotid pulses 3+ palpable no bruits audible Abdomen soft nontender no palpable masses Musculoskeletal free of  major deformities Skin clear -no rashes Neurologic normal Lower extremities 3+ femoral pulses bilaterally. No popliteal or distal pulses palpable.  Today I ordered bilateral lower extremity vein mapping it appears the left great saphenous vein is adequate size for use as a conduit        Assessment:     Left superficial femoral occlusion with limiting claudication. I discussed options with patient and her daughter and they would like to proceed with left femoral-popliteal bypass grafting using saphenous vein if feasible.    Plan:     Plan left femoral-popliteal saphenous vein graft on Thursday, October 9      

## 2012-10-10 NOTE — Pre-Procedure Instructions (Signed)
[name]  10/10/2012   Your procedure is scheduled on:  Thursday October 12, 2012.  Report to [facility] Short Stay Entrance "A" Admitting at 5:30 AM.  Call this number if you have problems the morning of surgery: [phone]   Remember:   Do not eat food or drink liquids after midnight.   Take these medicines the morning of surgery with A SIP OF WATER: None                 Plavix to be stopped 10/10/12.  Do not wear jewelry, make-up or nail polish.  Do not wear lotions, powders, or perfumes. You may wear deodorant.  Do not shave 48 hours prior to surgery.   Do not bring valuables to the hospital.  [facility] is not responsible for any belongings or valuables.               Contacts, dentures or bridgework may not be worn into surgery.  Leave suitcase in the car. After surgery it may be brought to your room.  For patients admitted to the hospital, discharge time is determined by your treatment team.               Patients discharged the day of surgery will not be allowed to drive home.  Name and phone number of your driver: Family/Friend  Special Instructions: Shower using CHG 2 nights before surgery and the night before surgery.  If you shower the day of surgery use CHG.  Use special wash - you have one bottle of CHG for all showers.  You should use approximately 1/3 of the bottle for each shower.   Please read over the following fact sheets that you were given: Pain Booklet, Coughing and Deep Breathing, Blood Transfusion Information, MRSA Information and Surgical Site Infection Prevention

## 2012-10-11 MED ORDER — DEXTROSE 5 % IV SOLN
1.5000 g | INTRAVENOUS | Status: AC
  Administered 2012-10-12: 1.5 g via INTRAVENOUS
  Filled 2012-10-11: qty 1.5

## 2012-10-12 ENCOUNTER — Encounter (HOSPITAL_COMMUNITY): Payer: Medicare Other | Admitting: Certified Registered"

## 2012-10-12 ENCOUNTER — Inpatient Hospital Stay (HOSPITAL_COMMUNITY)
Admission: RE | Admit: 2012-10-12 | Discharge: 2012-10-14 | DRG: 253 | Disposition: A | Discharge status: Home / Self Care | Payer: Medicare Other | Source: Ambulatory Visit | Attending: Vascular Surgery | Admitting: Vascular Surgery

## 2012-10-12 ENCOUNTER — Inpatient Hospital Stay (HOSPITAL_COMMUNITY): Payer: Medicare Other

## 2012-10-12 ENCOUNTER — Encounter (HOSPITAL_COMMUNITY)
Admission: RE | Disposition: A | Discharge status: Home / Self Care | Payer: Self-pay | Source: Ambulatory Visit | Attending: Vascular Surgery

## 2012-10-12 ENCOUNTER — Inpatient Hospital Stay (HOSPITAL_COMMUNITY): Payer: Medicare Other | Admitting: Certified Registered"

## 2012-10-12 ENCOUNTER — Encounter (HOSPITAL_COMMUNITY): Payer: Self-pay

## 2012-10-12 DIAGNOSIS — I10 Essential (primary) hypertension: Secondary | ICD-10-CM | POA: Diagnosis present

## 2012-10-12 DIAGNOSIS — I6529 Occlusion and stenosis of unspecified carotid artery: Secondary | ICD-10-CM | POA: Diagnosis present

## 2012-10-12 DIAGNOSIS — Z7982 Long term (current) use of aspirin: Secondary | ICD-10-CM

## 2012-10-12 DIAGNOSIS — E785 Hyperlipidemia, unspecified: Secondary | ICD-10-CM | POA: Diagnosis present

## 2012-10-12 DIAGNOSIS — E119 Type 2 diabetes mellitus without complications: Secondary | ICD-10-CM | POA: Diagnosis present

## 2012-10-12 DIAGNOSIS — Z01812 Encounter for preprocedural laboratory examination: Secondary | ICD-10-CM

## 2012-10-12 DIAGNOSIS — Z8249 Family history of ischemic heart disease and other diseases of the circulatory system: Secondary | ICD-10-CM

## 2012-10-12 DIAGNOSIS — I739 Peripheral vascular disease, unspecified: Secondary | ICD-10-CM

## 2012-10-12 DIAGNOSIS — I70219 Atherosclerosis of native arteries of extremities with intermittent claudication, unspecified extremity: Secondary | ICD-10-CM

## 2012-10-12 DIAGNOSIS — Z8673 Personal history of transient ischemic attack (TIA), and cerebral infarction without residual deficits: Secondary | ICD-10-CM

## 2012-10-12 DIAGNOSIS — F17200 Nicotine dependence, unspecified, uncomplicated: Secondary | ICD-10-CM | POA: Diagnosis present

## 2012-10-12 DIAGNOSIS — Z79899 Other long term (current) drug therapy: Secondary | ICD-10-CM

## 2012-10-12 DIAGNOSIS — Z7902 Long term (current) use of antithrombotics/antiplatelets: Secondary | ICD-10-CM

## 2012-10-12 DIAGNOSIS — I743 Embolism and thrombosis of arteries of the lower extremities: Secondary | ICD-10-CM | POA: Diagnosis present

## 2012-10-12 HISTORY — PX: FEMORAL-POPLITEAL BYPASS GRAFT: SHX937

## 2012-10-12 LAB — GLUCOSE, CAPILLARY: Glucose-Capillary: 109 mg/dL — ABNORMAL HIGH (ref 70–99)

## 2012-10-12 LAB — CREATININE, SERUM
GFR calc Af Amer: 80 mL/min — ABNORMAL LOW (ref >90)
GFR calc non Af Amer: 69 mL/min — ABNORMAL LOW (ref >90)

## 2012-10-12 LAB — CBC
MCHC: 33 g/dL (ref 30.0–36.0)
Platelets: 169 10*3/uL (ref 150–400)
RDW: 14.5 % (ref 11.5–15.5)
WBC: 13 10*3/uL — ABNORMAL HIGH (ref 4.0–10.5)

## 2012-10-12 LAB — URINE CULTURE

## 2012-10-12 SURGERY — BYPASS GRAFT FEMORAL-POPLITEAL ARTERY
Anesthesia: General | Laterality: Left | Wound class: Clean

## 2012-10-12 MED ORDER — NEOSTIGMINE METHYLSULFATE 1 MG/ML IJ SOLN
INTRAMUSCULAR | Status: DC | PRN
  Administered 2012-10-12: 3 mg via INTRAVENOUS

## 2012-10-12 MED ORDER — SENNOSIDES-DOCUSATE SODIUM 8.6-50 MG PO TABS
1.0000 | ORAL_TABLET | Freq: Every evening | ORAL | Status: DC | PRN
  Filled 2012-10-12: qty 1

## 2012-10-12 MED ORDER — SODIUM CHLORIDE 0.9 % IV SOLN: INTRAVENOUS | Status: DC

## 2012-10-12 MED ORDER — MAGNESIUM SULFATE 40 MG/ML IJ SOLN
2.0000 g | Freq: Once | INTRAMUSCULAR | Status: AC | PRN
  Filled 2012-10-12: qty 50

## 2012-10-12 MED ORDER — ASPIRIN 81 MG PO CHEW
81.0000 mg | CHEWABLE_TABLET | Freq: Every day | ORAL | Status: DC
  Administered 2012-10-13 – 2012-10-14 (×2): 81 mg via ORAL
  Filled 2012-10-12 (×2): qty 1

## 2012-10-12 MED ORDER — SODIUM CHLORIDE 0.9 % IV SOLN: 500.0000 mL | Freq: Once | INTRAVENOUS | Status: AC | PRN

## 2012-10-12 MED ORDER — BISACODYL 10 MG RE SUPP: 10.0000 mg | Freq: Every day | RECTAL | Status: DC | PRN

## 2012-10-12 MED ORDER — PROTAMINE SULFATE 10 MG/ML IV SOLN
INTRAVENOUS | Status: DC | PRN
  Administered 2012-10-12 (×5): 10 mg via INTRAVENOUS

## 2012-10-12 MED ORDER — ONDANSETRON HCL 4 MG/2ML IJ SOLN: 4.0000 mg | Freq: Four times a day (QID) | INTRAMUSCULAR | Status: DC | PRN

## 2012-10-12 MED ORDER — LABETALOL HCL 5 MG/ML IV SOLN
10.0000 mg | INTRAVENOUS | Status: DC | PRN
  Filled 2012-10-12: qty 4

## 2012-10-12 MED ORDER — SODIUM CHLORIDE 0.9 % IR SOLN
Status: DC | PRN
  Administered 2012-10-12: 08:00:00

## 2012-10-12 MED ORDER — OXYCODONE-ACETAMINOPHEN 5-325 MG PO TABS
1.0000 | ORAL_TABLET | ORAL | Status: DC | PRN
  Administered 2012-10-13: 2 via ORAL
  Filled 2012-10-12: qty 1
  Filled 2012-10-12: qty 2

## 2012-10-12 MED ORDER — FENTANYL CITRATE 0.05 MG/ML IJ SOLN: 25.0000 ug | INTRAMUSCULAR | Status: DC | PRN

## 2012-10-12 MED ORDER — GLYCOPYRROLATE 0.2 MG/ML IJ SOLN: INTRAMUSCULAR | Status: DC | PRN

## 2012-10-12 MED ORDER — DEXTROSE 5 % IV SOLN
1.5000 g | Freq: Two times a day (BID) | INTRAVENOUS | Status: AC
  Administered 2012-10-12 – 2012-10-13 (×2): 1.5 g via INTRAVENOUS
  Filled 2012-10-12 (×2): qty 1.5

## 2012-10-12 MED ORDER — ALUM & MAG HYDROXIDE-SIMETH 200-200-20 MG/5ML PO SUSP
15.0000 mL | ORAL | Status: DC | PRN
  Administered 2012-10-13: 30 mL via ORAL
  Filled 2012-10-12: qty 30

## 2012-10-12 MED ORDER — DOPAMINE-DEXTROSE 3.2-5 MG/ML-% IV SOLN
3.0000 ug/kg/min | INTRAVENOUS | Status: DC
Start: 2012-10-12 — End: 2012-10-14

## 2012-10-12 MED ORDER — LIDOCAINE HCL (CARDIAC) 20 MG/ML IV SOLN
INTRAVENOUS | Status: DC | PRN
  Administered 2012-10-12: 100 mg via INTRAVENOUS

## 2012-10-12 MED ORDER — ACETAMINOPHEN 325 MG PO TABS
325.0000 mg | ORAL_TABLET | ORAL | Status: DC | PRN
  Administered 2012-10-14: 650 mg via ORAL
  Filled 2012-10-12: qty 2

## 2012-10-12 MED ORDER — 0.9 % SODIUM CHLORIDE (POUR BTL) OPTIME
TOPICAL | Status: DC | PRN
  Administered 2012-10-12: 1000 mL

## 2012-10-12 MED ORDER — POTASSIUM CHLORIDE CRYS ER 20 MEQ PO TBCR: 20.0000 meq | EXTENDED_RELEASE_TABLET | Freq: Once | ORAL | Status: AC | PRN

## 2012-10-12 MED ORDER — LACTATED RINGERS IV SOLN
INTRAVENOUS | Status: DC | PRN
  Administered 2012-10-12 (×3): via INTRAVENOUS

## 2012-10-12 MED ORDER — ACETAMINOPHEN 325 MG RE SUPP
325.0000 mg | RECTAL | Status: DC | PRN
  Filled 2012-10-12: qty 2

## 2012-10-12 MED ORDER — PHENOL 1.4 % MT LIQD
1.0000 | OROMUCOSAL | Status: DC | PRN
  Filled 2012-10-12: qty 177

## 2012-10-12 MED ORDER — ARTIFICIAL TEARS OP OINT
TOPICAL_OINTMENT | OPHTHALMIC | Status: DC | PRN
  Administered 2012-10-12: 1 via OPHTHALMIC

## 2012-10-12 MED ORDER — INFLUENZA VAC SPLIT QUAD 0.5 ML IM SUSP
0.5000 mL | INTRAMUSCULAR | Status: AC
  Administered 2012-10-13: 0.5 mL via INTRAMUSCULAR
  Filled 2012-10-12: qty 0.5

## 2012-10-12 MED ORDER — MORPHINE SULFATE 2 MG/ML IJ SOLN
2.0000 mg | INTRAMUSCULAR | Status: DC | PRN
  Administered 2012-10-12 (×2): 2 mg via INTRAVENOUS
  Filled 2012-10-12 (×2): qty 1

## 2012-10-12 MED ORDER — ENOXAPARIN SODIUM 40 MG/0.4ML ~~LOC~~ SOLN
40.0000 mg | SUBCUTANEOUS | Status: DC
  Administered 2012-10-13 – 2012-10-14 (×2): 40 mg via SUBCUTANEOUS
  Filled 2012-10-12 (×4): qty 0.4

## 2012-10-12 MED ORDER — PNEUMOCOCCAL VAC POLYVALENT 25 MCG/0.5ML IJ INJ
0.5000 mL | INJECTION | INTRAMUSCULAR | Status: AC
  Administered 2012-10-13: 0.5 mL via INTRAMUSCULAR
  Filled 2012-10-12: qty 0.5

## 2012-10-12 MED ORDER — ONDANSETRON HCL 4 MG/2ML IJ SOLN
INTRAMUSCULAR | Status: DC | PRN
  Administered 2012-10-12: 4 mg via INTRAMUSCULAR

## 2012-10-12 MED ORDER — SODIUM CHLORIDE 0.9 % IV SOLN
INTRAVENOUS | Status: DC
  Administered 2012-10-12 (×2): via INTRAVENOUS

## 2012-10-12 MED ORDER — GUAIFENESIN-DM 100-10 MG/5ML PO SYRP: 15.0000 mL | ORAL_SOLUTION | ORAL | Status: DC | PRN

## 2012-10-12 MED ORDER — METOPROLOL TARTRATE 1 MG/ML IV SOLN: 2.0000 mg | INTRAVENOUS | Status: DC | PRN

## 2012-10-12 MED ORDER — PANTOPRAZOLE SODIUM 40 MG PO TBEC
40.0000 mg | DELAYED_RELEASE_TABLET | Freq: Every day | ORAL | Status: DC
  Administered 2012-10-13 – 2012-10-14 (×2): 40 mg via ORAL
  Filled 2012-10-12 (×2): qty 1

## 2012-10-12 MED ORDER — EPHEDRINE SULFATE 50 MG/ML IJ SOLN
INTRAMUSCULAR | Status: DC | PRN
  Administered 2012-10-12: 10 mg via INTRAVENOUS
  Administered 2012-10-12 (×2): 5 mg via INTRAVENOUS
  Administered 2012-10-12: 10 mg via INTRAVENOUS

## 2012-10-12 MED ORDER — IOHEXOL 300 MG/ML  SOLN
INTRAMUSCULAR | Status: DC | PRN
  Administered 2012-10-12: 50 mL via INTRA_ARTERIAL

## 2012-10-12 MED ORDER — PROMETHAZINE HCL 25 MG/ML IJ SOLN: 6.2500 mg | INTRAMUSCULAR | Status: DC | PRN

## 2012-10-12 MED ORDER — MIDAZOLAM HCL 5 MG/5ML IJ SOLN
INTRAMUSCULAR | Status: DC | PRN
  Administered 2012-10-12 (×2): 1 mg via INTRAVENOUS

## 2012-10-12 MED ORDER — PROPOFOL 10 MG/ML IV BOLUS
INTRAVENOUS | Status: DC | PRN
  Administered 2012-10-12: 50 mg via INTRAVENOUS
  Administered 2012-10-12: 150 mg via INTRAVENOUS

## 2012-10-12 MED ORDER — NEOSTIGMINE METHYLSULFATE 1 MG/ML IJ SOLN: INTRAMUSCULAR | Status: DC | PRN

## 2012-10-12 MED ORDER — ATORVASTATIN CALCIUM 40 MG PO TABS
40.0000 mg | ORAL_TABLET | Freq: Every day | ORAL | Status: DC
  Administered 2012-10-13: 40 mg via ORAL
  Filled 2012-10-12 (×3): qty 1

## 2012-10-12 MED ORDER — CLOPIDOGREL BISULFATE 75 MG PO TABS
75.0000 mg | ORAL_TABLET | Freq: Every day | ORAL | Status: DC
  Administered 2012-10-13 – 2012-10-14 (×2): 75 mg via ORAL
  Filled 2012-10-12 (×3): qty 1

## 2012-10-12 MED ORDER — FLEET ENEMA 7-19 GM/118ML RE ENEM: 1.0000 | ENEMA | Freq: Once | RECTAL | Status: AC | PRN

## 2012-10-12 MED ORDER — GLYCOPYRROLATE 0.2 MG/ML IJ SOLN
INTRAMUSCULAR | Status: DC | PRN
  Administered 2012-10-12: .4 mg via INTRAVENOUS

## 2012-10-12 MED ORDER — LOSARTAN POTASSIUM 50 MG PO TABS
50.0000 mg | ORAL_TABLET | Freq: Every day | ORAL | Status: DC
  Administered 2012-10-13 – 2012-10-14 (×2): 50 mg via ORAL
  Filled 2012-10-12 (×3): qty 1

## 2012-10-12 MED ORDER — DOCUSATE SODIUM 100 MG PO CAPS
100.0000 mg | ORAL_CAPSULE | Freq: Every day | ORAL | Status: DC
  Administered 2012-10-13 – 2012-10-14 (×2): 100 mg via ORAL
  Filled 2012-10-12 (×2): qty 1

## 2012-10-12 MED ORDER — FENTANYL CITRATE 0.05 MG/ML IJ SOLN
INTRAMUSCULAR | Status: DC | PRN
  Administered 2012-10-12: 25 ug via INTRAVENOUS
  Administered 2012-10-12 (×3): 50 ug via INTRAVENOUS
  Administered 2012-10-12: 25 ug via INTRAVENOUS
  Administered 2012-10-12 (×5): 50 ug via INTRAVENOUS

## 2012-10-12 MED ORDER — HYDRALAZINE HCL 20 MG/ML IJ SOLN: 10.0000 mg | INTRAMUSCULAR | Status: DC | PRN

## 2012-10-12 MED ORDER — HEPARIN SODIUM (PORCINE) 1000 UNIT/ML IJ SOLN
INTRAMUSCULAR | Status: DC | PRN
  Administered 2012-10-12: 6000 [IU] via INTRAVENOUS

## 2012-10-12 MED ORDER — ROCURONIUM BROMIDE 100 MG/10ML IV SOLN
INTRAVENOUS | Status: DC | PRN
  Administered 2012-10-12: 50 mg via INTRAVENOUS

## 2012-10-12 SURGICAL SUPPLY — 54 items
BANDAGE ESMARK 6X9 LF (GAUZE/BANDAGES/DRESSINGS) IMPLANT
BNDG ESMARK 6X9 LF (GAUZE/BANDAGES/DRESSINGS)
BOOT SUTURE AID YELLOW STND (SUTURE) IMPLANT
CANISTER SUCTION 2500CC (MISCELLANEOUS) ×2 IMPLANT
CLIP TI MEDIUM 24 (CLIP) ×2 IMPLANT
CLIP TI WIDE RED SMALL 24 (CLIP) ×2 IMPLANT
COVER SURGICAL LIGHT HANDLE (MISCELLANEOUS) ×2 IMPLANT
DECANTER SPIKE VIAL GLASS SM (MISCELLANEOUS) IMPLANT
DERMABOND ADVANCED (GAUZE/BANDAGES/DRESSINGS) ×3
DERMABOND ADVANCED .7 DNX12 (GAUZE/BANDAGES/DRESSINGS) ×3 IMPLANT
DRAIN SNY 10X20 3/4 PERF (WOUND CARE) IMPLANT
DRAPE WARM FLUID 44X44 (DRAPE) ×2 IMPLANT
DRAPE X-RAY CASS 24X20 (DRAPES) ×2 IMPLANT
ELECT REM PT RETURN 9FT ADLT (ELECTROSURGICAL) ×2
ELECTRODE REM PT RTRN 9FT ADLT (ELECTROSURGICAL) ×1 IMPLANT
EVACUATOR SILICONE 100CC (DRAIN) IMPLANT
GLOVE BIOGEL PI IND STRL 6.5 (GLOVE) ×2 IMPLANT
GLOVE BIOGEL PI IND STRL 7.0 (GLOVE) ×3 IMPLANT
GLOVE BIOGEL PI IND STRL 7.5 (GLOVE) ×1 IMPLANT
GLOVE BIOGEL PI INDICATOR 6.5 (GLOVE) ×2
GLOVE BIOGEL PI INDICATOR 7.0 (GLOVE) ×3
GLOVE BIOGEL PI INDICATOR 7.5 (GLOVE) ×1
GLOVE ECLIPSE 6.5 STRL STRAW (GLOVE) ×4 IMPLANT
GLOVE SS BIOGEL STRL SZ 7 (GLOVE) ×2 IMPLANT
GLOVE SUPERSENSE BIOGEL SZ 7 (GLOVE) ×2
GOWN STRL NON-REIN LRG LVL3 (GOWN DISPOSABLE) ×10 IMPLANT
INSERT FOGARTY SM (MISCELLANEOUS) ×2 IMPLANT
KIT BASIN OR (CUSTOM PROCEDURE TRAY) ×2 IMPLANT
KIT ROOM TURNOVER OR (KITS) ×2 IMPLANT
NS IRRIG 1000ML POUR BTL (IV SOLUTION) ×4 IMPLANT
PACK PERIPHERAL VASCULAR (CUSTOM PROCEDURE TRAY) ×2 IMPLANT
PAD ARMBOARD 7.5X6 YLW CONV (MISCELLANEOUS) ×4 IMPLANT
PADDING CAST COTTON 6X4 STRL (CAST SUPPLIES) IMPLANT
SET COLLECT BLD 21X3/4 12 (NEEDLE) ×2 IMPLANT
STOPCOCK 4 WAY LG BORE MALE ST (IV SETS) ×2 IMPLANT
SUT PROLENE 6 0 BV (SUTURE) IMPLANT
SUT PROLENE 6 0 CC (SUTURE) ×6 IMPLANT
SUT PROLENE 7 0 BV 1 (SUTURE) ×2 IMPLANT
SUT PROLENE 7 0 BV1 MDA (SUTURE) IMPLANT
SUT SILK 2 0 SH (SUTURE) ×2 IMPLANT
SUT SILK 3 0 (SUTURE) ×1
SUT SILK 3-0 18XBRD TIE 12 (SUTURE) ×1 IMPLANT
SUT SILK 4 0 (SUTURE) ×1
SUT SILK 4-0 18XBRD TIE 12 (SUTURE) ×1 IMPLANT
SUT VIC AB 2-0 CTX 36 (SUTURE) ×4 IMPLANT
SUT VIC AB 3-0 SH 27 (SUTURE) ×4
SUT VIC AB 3-0 SH 27X BRD (SUTURE) ×4 IMPLANT
SUT VIC AB 4-0 PS2 27 (SUTURE) ×2 IMPLANT
TOWEL OR 17X24 6PK STRL BLUE (TOWEL DISPOSABLE) ×4 IMPLANT
TOWEL OR 17X26 10 PK STRL BLUE (TOWEL DISPOSABLE) ×4 IMPLANT
TRAY FOLEY CATH 16FRSI W/METER (SET/KITS/TRAYS/PACK) ×2 IMPLANT
TUBING EXTENTION W/L.L. (IV SETS) ×2 IMPLANT
UNDERPAD 30X30 INCONTINENT (UNDERPADS AND DIAPERS) ×2 IMPLANT
WATER STERILE IRR 1000ML POUR (IV SOLUTION) ×2 IMPLANT

## 2012-10-12 NOTE — Op Note (Signed)
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 The left leg was prepped with Betadine scrub and solution draped in routine sterile manner. Saphenous vein was exposed at the saphenofemoral junction and removed through multiple incisions along the medial aspect of the left leg down to the mid calf level. It was satisfactory being 2-1/2-3 mm in most areas. Common superficial and profunda femoris arteries were dissected free and the inguinal incision. There was diffuse calcific disease throughout with heavily calcified plaque at the inguinal ligament extending up into the external iliac artery. There was however a good pulse in the common femoral artery and a soft vessel anteriorly. The popliteal artery was exposed below the knee where it was patent on angiogram with two-vessel runoff through diseased anterior tibial and peroneal arteries. There was diffuse calcific disease in the distal popliteal artery however it was soft proximally. A subfascial anatomic tunnel was created and the patient was heparinized. Femoral vessels were occluded with vascular clamps a longitudinal opening made in the common femoral artery with a 15 blade extended with the Potts scissors. There was excellent inflow. Proximally the vein was spatulated and anastomosed end to side with 6-0 Prolene. Following this clamps were released and there was  a good pulse to the first set of competent valves. Using the retrograde valvulotome the valves were rendered incompetent with resultant excellent flow out the distal end of the vein graft. Vein was carefully delivered through the tunnel being careful to observe orientation. Popliteal artery was occluded below the knee proximally and distally a 10 blade and Potts scissors. It was diffusely diseased with calcific plaque distally as noted heparin saline to flush low resistance. Following this the popliteal artery was opened proximally with a 15 blade extended with the Potts scissors the vein was carefully measured spatulated and anastomosed end to side with 6-0 Prolene. Clamps were released there was an excellent pulse in the graft with improved Doppler flow in the anterior tibial and peroneal arteries at the ankle. Intraoperative arteriogram was then performed which revealed a widely patent anastomosis with two-vessel runoff through diseased anterior tibial and peroneal arteries. Protamine was given to reverse the heparin following adequate hemostasis the wounds were irrigated with saline closed in layers with Vicryl in a subcuticular fashion with Dermabond patient taken to the recovery room in stable condition  Procedure Details:   [name], MD 10/12/2012 10:38 AM

## 2012-10-12 NOTE — Anesthesia Preprocedure Evaluation (Signed)
Anesthesia Evaluation  Patient identified by MRN, date of birth, ID band Patient awake    History of Anesthesia Complications Negative for: history of anesthetic complications  Airway Mallampati: I  Neck ROM: Full    Dental   Pulmonary neg pulmonary ROS,  breath sounds clear to auscultation        Cardiovascular hypertension, + Peripheral Vascular Disease Rhythm:Regular Rate:Normal     Neuro/Psych CVA    GI/Hepatic   Endo/Other  diabetes  Renal/GU      Musculoskeletal   Abdominal   Peds  Hematology   Anesthesia Other Findings   Reproductive/Obstetrics                           Anesthesia Physical Anesthesia Plan  ASA: III  Anesthesia Plan: General   Post-op Pain Management:    Induction: Intravenous  Airway Management Planned: Oral ETT  Additional Equipment: Arterial line  Intra-op Plan:   Post-operative Plan: Extubation in OR  Informed Consent: I have reviewed the patients History and Physical, chart, labs and discussed the procedure including the risks, benefits and alternatives for the proposed anesthesia with the patient or authorized representative who has indicated his/her understanding and acceptance.   Dental advisory given  Plan Discussed with:   Anesthesia Plan Comments:         Anesthesia Quick Evaluation

## 2012-10-12 NOTE — H&P (View-Only) (Signed)
Subjective:     Patient ID: [name], female   DOB: Jul 29, 1940, 72 y.o.   MRN: [id number]  HPI this 71 year old female returns today to discuss left femoral-popliteal bypass grafting. She has limiting claudication of the left leg mainly in the calf after walking about one half block. She was then rest for 5 minutes before proceeding. She has had previous stents in both superficial femoral arteries and recent angiogram by Dr. Myra Gianotti confirmed occlusion of the left SFA with patency of the below knee popliteal artery and two-vessel runoff.  Past Medical History  Diagnosis Date  . Hyperlipidemia   . 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 Stroke   . Hypertension   . Carotid artery occlusion   . Diabetes mellitus     History  Substance Use Topics  . Smoking status: Current Some Day Smoker    Types: Cigarettes  . Smokeless tobacco: Never Used     Comment: pt states she smokes about 4-5 cigs per day and is trying to quit  . Alcohol Use: No    Family History  Problem Relation Age of Onset  . Coronary artery disease Mother   . Heart disease Mother   . Heart attack Mother   . Coronary artery disease Sister   . Heart disease Sister   . Heart disease Father   . Other Father     amputation    No Known Allergies  Current outpatient prescriptions:aspirin 81 MG tablet, Take 81 mg by mouth daily., Disp: , Rfl: ;  clopidogrel (PLAVIX) 75 MG tablet, Take 75 mg by mouth daily., Disp: , Rfl: ;  ibuprofen (ADVIL) 200 MG tablet, Take 400 mg by mouth daily as needed for pain., Disp: , Rfl: ;  losartan (COZAAR) 50 MG tablet, Take 50 mg by mouth Daily., Disp: , Rfl: ;  simvastatin (ZOCOR) 80 MG tablet, Take 80 mg by mouth at bedtime., Disp: , Rfl:   BP 145/74  Pulse 80  Resp 16  Ht 5\' 5"  (1.651 m)  Wt 160 lb (72.576 kg)  BMI 26.63 kg/m2  Body mass index is 26.63 kg/(m^2).           Review of Systems  denies chest pain, dyspnea on exertion, PND, orthopnea, hemoptysis.     Objective:   Physical Exam BP 145/74  Pulse 80  Resp 16  Ht 5\' 5"  (1.651 m)  Wt 160 lb (72.576 kg)  BMI 26.63 kg/m2  Gen.-alert and oriented x3 in no apparent distress HEENT normal for age Lungs no rhonchi or wheezing Cardiovascular regular rhythm no murmurs carotid pulses 3+ palpable no bruits audible Abdomen soft nontender no palpable masses Musculoskeletal free of  major deformities Skin clear -no rashes Neurologic normal Lower extremities 3+ femoral pulses bilaterally. No popliteal or distal pulses palpable.  Today I ordered bilateral lower extremity vein mapping it appears the left great saphenous vein is adequate size for use as a conduit        Assessment:     Left superficial femoral occlusion with limiting claudication. I discussed options with patient and her daughter and they would like to proceed with left femoral-popliteal bypass grafting using saphenous vein if feasible.    Plan:     Plan left femoral-popliteal saphenous vein graft on Thursday, October 9

## 2012-10-12 NOTE — Anesthesia Procedure Notes (Signed)
Procedure Name: Intubation Date/Time: 10/12/2012 7:40 AM Performed by: Sherie Don Pre-anesthesia Checklist: Patient identified, Emergency Drugs available, Suction available, Patient being monitored and Timeout performed Patient Re-evaluated:Patient Re-evaluated prior to inductionOxygen Delivery Method: Circle system utilized Preoxygenation: Pre-oxygenation with 100% oxygen Intubation Type: IV induction Ventilation: Mask ventilation without difficulty Laryngoscope Size: Mac and 3 Grade View: Grade II Tube type: Parker flex tip Tube size: 7.0 mm Number of attempts: 1 Airway Equipment and Method: Stylet Placement Confirmation: ETT inserted through vocal cords under direct vision,  positive ETCO2 and breath sounds checked- equal and bilateral Secured at: 22 cm Tube secured with: Tape Dental Injury: Teeth and Oropharynx as per pre-operative assessment

## 2012-10-12 NOTE — Transfer of Care (Signed)
Immediate Anesthesia Transfer of Care Note  Patient: [name]  Procedure(s) Performed: Procedure(s): BYPASS GRAFT FEMORAL-POPLITEAL ARTERY (Left)  Patient Location: PACU  Anesthesia Type:General  Level of Consciousness: sedated and patient cooperative  Airway & Oxygen Therapy: Patient Spontanous Breathing and Patient connected to face mask oxygen  Post-op Assessment: Report given to PACU RN, Post -op Vital signs reviewed and stable and Patient moving all extremities X 4  Post vital signs: Reviewed and stable  Complications: No apparent anesthesia complications

## 2012-10-12 NOTE — Preoperative (Signed)
Beta Blockers   Reason not to administer Beta Blockers:Not Applicable 

## 2012-10-12 NOTE — Interval H&P Note (Signed)
History and Physical Interval Note:  10/12/2012 7:28 AM  [name]  has presented today for surgery, with the diagnosis of PVD  The various methods of treatment have been discussed with the patient and family. After consideration of risks, benefits and other options for treatment, the patient has consented to  Procedure(s): BYPASS GRAFT FEMORAL-POPLITEAL ARTERY (Left) as a surgical intervention .  The patient's history has been reviewed, patient examined, no change in status, stable for surgery.  I have reviewed the patient's chart and labs.  Questions were answered to the patient's satisfaction.     [name]

## 2012-10-12 NOTE — Anesthesia Postprocedure Evaluation (Signed)
  Anesthesia Post-op Note  Patient: [name]  Procedure(s) Performed: Procedure(s): BYPASS GRAFT FEMORAL-POPLITEAL ARTERY (Left)  Patient Location: PACU  Anesthesia Type:General  Level of Consciousness: awake and alert   Airway and Oxygen Therapy: Patient Spontanous Breathing  Post-op Pain: mild  Post-op Assessment: Post-op Vital signs reviewed  Post-op Vital Signs: stable  Complications: No apparent anesthesia complications

## 2012-10-12 NOTE — Progress Notes (Signed)
Utilization review completed.  

## 2012-10-12 NOTE — Progress Notes (Signed)
Pt arrived from PACU, VSS, pulses present-L dopplered, oriented to unit and routine, family at bedside,  Notified CMT and ELINK of arrival. Will continue to monitor.

## 2012-10-13 ENCOUNTER — Encounter (HOSPITAL_COMMUNITY): Payer: Self-pay | Admitting: Vascular Surgery

## 2012-10-13 DIAGNOSIS — Z9889 Other specified postprocedural states: Secondary | ICD-10-CM

## 2012-10-13 LAB — BASIC METABOLIC PANEL
Calcium: 8.1 mg/dL — ABNORMAL LOW (ref 8.4–10.5)
Creatinine, Ser: 0.87 mg/dL (ref 0.50–1.10)
GFR calc Af Amer: 76 mL/min — ABNORMAL LOW (ref >90)
GFR calc non Af Amer: 66 mL/min — ABNORMAL LOW (ref >90)
Glucose, Bld: 103 mg/dL — ABNORMAL HIGH (ref 70–99)

## 2012-10-13 LAB — CBC
HCT: 35.1 % — ABNORMAL LOW (ref 36.0–46.0)
Hemoglobin: 11.6 g/dL — ABNORMAL LOW (ref 12.0–15.0)
MCH: 31.4 pg (ref 26.0–34.0)
MCHC: 33 g/dL (ref 30.0–36.0)
MCV: 95.1 fL (ref 78.0–100.0)
Platelets: 156 10*3/uL (ref 150–400)
RDW: 14.7 % (ref 11.5–15.5)
WBC: 8.8 10*3/uL (ref 4.0–10.5)

## 2012-10-13 LAB — TYPE AND SCREEN
ABO/RH(D): O POS
Antibody Screen: NEGATIVE
Unit division: 0
Unit division: 0

## 2012-10-13 MED ORDER — SODIUM CHLORIDE 0.9 % IV SOLN: INTRAVENOUS | Status: DC

## 2012-10-13 NOTE — Progress Notes (Signed)
Report called to receiving RN. Patient packed up and transferred to 2w21 via wheel chair.

## 2012-10-13 NOTE — Progress Notes (Signed)
VASCULAR LAB PRELIMINARY  ARTERIAL  ABI completed:    RIGHT    LEFT    PRESSURE WAVEFORM  PRESSURE WAVEFORM  BRACHIAL 139 tri BRACHIAL 131 tri  DP   DP    AT 123 mono AT 82 mono  PT 109 mono PT 79 mono  PER   PER    GREAT TOE  NA GREAT TOE  NA    RIGHT LEFT  ABI 0.88 0.59    Farrel Demark, RDMS, RVT  10/13/2012, 11:38 AM

## 2012-10-13 NOTE — Progress Notes (Addendum)
[facility] [place]  Progress Note Bypass Surgery  Date of Surgery: 10/12/2012  Procedure(s): BYPASS GRAFT FEMORAL-POPLITEAL ARTERY Surgeon: Surgeon(s): Pryor Ochoa, MD  1 Day Post-Op  History of Present Illness  [name] is a 72 y.o. female who is  1 Day Post-Op. The patient's pre-op symptoms of pain are Improved . Patients pain is well controlled.    VASC. LAB Studies:        ABI: pending   Imaging: Dg Ang/ext/uni/or Left  10/12/2012   CLINICAL DATA:  Peripheral vascular disease and left femoral to popliteal bypass grafting.  EXAM: LEFT ANG/EXT/UNI/ OR  TECHNIQUE: Intraoperative image was obtained during the surgical procedure.  FLUOROSCOPY TIME:  dictate in minutes & seconds  FINDINGS: Opacified graft is noted with a patent anastomosis to the popliteal artery below the knee. The anterior tibial and peroneal arteries are well opacified and show evidence the posterior tibial artery is likely chronically occluded are identified.  IMPRESSION: Open distal anastomosis of a femoral to below-knee popliteal graft. Anterior tibial and peroneal runoff is visualized.   Electronically Signed   By: Irish Lack M.D.   On: 10/12/2012 13:17    Significant Diagnostic Studies: CBC Lab Results  Component Value Date   WBC 8.8 10/13/2012   HGB 11.6* 10/13/2012   HCT 35.1* 10/13/2012   MCV 95.1 10/13/2012   PLT 156 10/13/2012    BMET    Component Value Date/Time   NA 142 10/13/2012 0400   K 4.0 10/13/2012 0400   CL 109 10/13/2012 0400   CO2 26 10/13/2012 0400   GLUCOSE 103* 10/13/2012 0400   BUN 12 10/13/2012 0400   CREATININE 0.87 10/13/2012 0400   CALCIUM 8.1* 10/13/2012 0400   GFRNONAA 66* 10/13/2012 0400   GFRAA 76* 10/13/2012 0400    COAG Lab Results  Component Value Date   INR 1.01 10/10/2012   No results found for this basename: PTT    Physical Examination  BP Readings from Last 3 Encounters:  10/13/12 120/55  10/13/12 120/55   10/10/12 146/72   Temp Readings from Last 3 Encounters:  10/13/12 98 F (36.7 C) Oral  10/13/12 98 F (36.7 C) Oral  10/10/12 98.5 F (36.9 C)    SpO2 Readings from Last 3 Encounters:  10/13/12 98%  10/13/12 98%  10/10/12 96%   Pulse Readings from Last 3 Encounters:  10/13/12 78  10/13/12 78  10/10/12 70    Pt is A&O x 3 left lower extremity: Incision/s is/are clean,dry.intact, and  healing without hematoma, erythema or drainage Limb is warm; with good color Doppler PT/DP/AT signals   Assessment/Plan: Pt. Doing well Post-op pain is controlled Wounds are healing well PT/OT for ambulation Transfer to 2000 plan D/C home tomorrow  Clinton Gallant [facility]  10/13/2012 7:41 AM  Agree with above assessment Incisions healing nicely 2+ popliteal graft pulse palpable Excellent flow dorsalis pedis  Agree with transfer to 2000 and DC home in a.m.

## 2012-10-13 NOTE — Evaluation (Signed)
Physical Therapy Evaluation Patient Details Name: [name] MRN: [id number] DOB: 1940/10/19 Today's Date: 10/13/2012 Time: 4098-1191 PT Time Calculation (min): 21 min  PT Assessment / Plan / Recommendation History of Present Illness  Patient is a 72 yo female admitted with LLE claudication.  Patient is s/p LLE bypass graft.  Clinical Impression  Patient presents with problems listed below.  Will benefit from acute PT to maximize independence prior to discharge.    PT Assessment  Patient needs continued PT services    Follow Up Recommendations  No PT follow up;Supervision/Assistance - 24 hour    Does the patient have the potential to tolerate intense rehabilitation      Barriers to Discharge Decreased caregiver support Patient has 2 daughters and 1 son that will provide 24 hour assist for several days.  Then will be available prn.    Equipment Recommendations  None recommended by PT    Recommendations for Other Services     Frequency Min 3X/week    Precautions / Restrictions Precautions Precautions: None Restrictions Weight Bearing Restrictions: No   Pertinent Vitals/Pain       Mobility  Bed Mobility Bed Mobility: Not assessed (Patient in chair as PT entered room) Transfers Transfers: Sit to Stand;Stand to Sit Sit to Stand: 6: Modified independent (Device/Increase time);With upper extremity assist;With armrests;From chair/3-in-1 Stand to Sit: 6: Modified independent (Device/Increase time);With upper extremity assist;With armrests;To chair/3-in-1 Details for Transfer Assistance: Cues for safety.   Ambulation/Gait Ambulation/Gait Assistance: 5: Supervision Ambulation Distance (Feet): 92 Feet Assistive device: Rolling walker Ambulation/Gait Assistance Details: Verbal cues for safe use of RW, and gait sequence. Gait Pattern: Step-to pattern;Decreased stance time - left;Decreased step length - right;Antalgic Gait velocity: Slow gait speed    Exercises     PT  Diagnosis: Difficulty walking;Acute pain  PT Problem List: Decreased range of motion;Decreased strength;Decreased balance;Decreased mobility;Decreased knowledge of use of DME;Pain PT Treatment Interventions: DME instruction;Gait training;Functional mobility training;Patient/family education     PT Goals(Current goals can be found in the care plan section) Acute Rehab PT Goals Patient Stated Goal: To go home tomorrow PT Goal Formulation: With patient Time For Goal Achievement: 10/20/12 Potential to Achieve Goals: Good  Visit Information  Last PT Received On: 10/13/12 Assistance Needed: +1 History of Present Illness: Patient is a 72 yo female admitted with LLE claudication.  Patient is s/p LLE bypass graft.       Prior Functioning  Home Living Family/patient expects to be discharged to:: Private residence Living Arrangements: Alone Available Help at Discharge: Family;Available 24 hours/day (Children-24 hours for several days, then prn) Type of Home: House Home Access: Level entry Home Layout: One level Home Equipment: Walker - 2 wheels Prior Function Level of Independence: Independent Communication Communication: No difficulties    Cognition  Cognition Arousal/Alertness: Awake/alert Behavior During Therapy: WFL for tasks assessed/performed Overall Cognitive Status: Within Functional Limits for tasks assessed    Extremity/Trunk Assessment Upper Extremity Assessment Upper Extremity Assessment: Overall WFL for tasks assessed Lower Extremity Assessment Lower Extremity Assessment: LLE deficits/detail LLE Deficits / Details: Decreased ROM and strength due to surgery.   LLE: Unable to fully assess due to pain Cervical / Trunk Assessment Cervical / Trunk Assessment: Normal   Balance    End of Session PT - End of Session Activity Tolerance: Patient limited by pain Patient left: in chair;with call bell/phone within reach Nurse Communication: Mobility status  GP     Vena Austria 10/13/2012, 5:17 PM  Durenda Hurt. Earlene Plater, PT,  [place] Pager [phone]

## 2012-10-13 NOTE — Progress Notes (Signed)
OT Cancellation Note  Patient Details Name: [name] MRN: [id number] DOB: 06/27/1940   Cancelled Treatment:    Reason Eval/Treat Not Completed: OT screened, no needs identified, will sign off. Patient and daughter both report they do not need OT services and patient has assist from family PRN.  [name] 10/13/2012, 3:57 PM

## 2012-10-14 MED ORDER — OXYCODONE-ACETAMINOPHEN 5-325 MG PO TABS: 1.0000 | ORAL_TABLET | ORAL | Status: DC | PRN

## 2012-10-14 NOTE — Progress Notes (Addendum)
[facility] [place]  Progress Note Bypass Surgery  Date of Surgery: 10/12/2012  Procedure(s): BYPASS GRAFT FEMORAL-POPLITEAL ARTERY Surgeon: Surgeon(s): Pryor Ochoa, MD  2 Days Post-Op  History of Present Illness  [name] is a 72 y.o. female who is  2 Days Post-Op. The patient's pre-op symptoms of pain are Improved . Patients pain is well controlled.    VASC. LAB Studies:        ABI: Right 0.88;  Left 0.59;   Imaging: Dg Ang/ext/uni/or Left  10/12/2012   CLINICAL DATA:  Peripheral vascular disease and left femoral to popliteal bypass grafting.  EXAM: LEFT ANG/EXT/UNI/ OR  TECHNIQUE: Intraoperative image was obtained during the surgical procedure.  FLUOROSCOPY TIME:  dictate in minutes & seconds  FINDINGS: Opacified graft is noted with a patent anastomosis to the popliteal artery below the knee. The anterior tibial and peroneal arteries are well opacified and show evidence the posterior tibial artery is likely chronically occluded are identified.  IMPRESSION: Open distal anastomosis of a femoral to below-knee popliteal graft. Anterior tibial and peroneal runoff is visualized.   Electronically Signed   By: Irish Lack M.D.   On: 10/12/2012 13:17    Significant Diagnostic Studies: CBC Lab Results  Component Value Date   WBC 8.8 10/13/2012   HGB 11.6* 10/13/2012   HCT 35.1* 10/13/2012   MCV 95.1 10/13/2012   PLT 156 10/13/2012    BMET    Component Value Date/Time   NA 142 10/13/2012 0400   K 4.0 10/13/2012 0400   CL 109 10/13/2012 0400   CO2 26 10/13/2012 0400   GLUCOSE 103* 10/13/2012 0400   BUN 12 10/13/2012 0400   CREATININE 0.87 10/13/2012 0400   CALCIUM 8.1* 10/13/2012 0400   GFRNONAA 66* 10/13/2012 0400   GFRAA 76* 10/13/2012 0400    COAG Lab Results  Component Value Date   INR 1.01 10/10/2012   No results found for this basename: PTT    Physical Examination  BP Readings from Last 3 Encounters:  10/14/12 131/75   10/14/12 131/75  10/10/12 146/72   Temp Readings from Last 3 Encounters:  10/14/12 98.8 F (37.1 C) Oral  10/14/12 98.8 F (37.1 C) Oral  10/10/12 98.5 F (36.9 C)    SpO2 Readings from Last 3 Encounters:  10/14/12 93%  10/14/12 93%  10/10/12 96%   Pulse Readings from Last 3 Encounters:  10/14/12 95  10/14/12 95  10/10/12 70    Pt is A&O x 3 left lower extremity: Incision/s is/are clean,dry.intact, and  healing without hematoma, erythema or drainage Limb is warm; with good color Palpable DP  Assessment/Plan: Pt. Doing well Post-op pain is controlled Wounds are healing well PT/OT for ambulation Continue wound care as ordered discharg home  Clinton Gallant [facility]  10/14/2012 8:46 AM   I agree with the above.  The patient's wounds are healing nicely.  Her foot is warm and well perfused.  She is stable for discharge.  [name]

## 2012-10-14 NOTE — Discharge Summary (Signed)
[facility] Discharge Summary   Patient ID:  [name] MRN: [id number] DOB/AGE: [birth year] 72 y.o.  Admit date: 10/12/2012 Discharge date: 10/14/2012 Date of Surgery: 10/12/2012 Surgeon: Surgeon(s): Pryor Ochoa, MD  Admission Diagnosis: PVD  Discharge Diagnoses:  PVD  Secondary Diagnoses: Past Medical History  Diagnosis Date  . Hyperlipidemia   . 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 Hypertension   . Carotid artery occlusion   . Stroke 2008  . Diabetes mellitus     borderline , no longer treated /w medicine     Procedure(s): BYPASS GRAFT FEMORAL-POPLITEAL ARTERY  Discharged Condition: good  HPI: 72 year old female is evaluated for severe left calf claudication symptoms. She has previously undergone stenting of her left SFA by me in 2007 and eventually had PTA of her left popliteal artery by Dr. Myra Gianotti in 2011. She also has had a stent placed in a right SFA. She currently is only able to walk about 1-200 feet before having to stop because of severe calf discomfort and numbness in the left foot and must wait 10 minutes before resuming. She has no rest pain or history of nonhealing ulcers.  She under went left fem-pop by pass using a nonreversible translocated saphenous vein graft from left leg with intraoperative arteriogram.  She was admitted to 3300 stepdown unit for 1 night.  We transferred her to 200 telemetry post-op day 1.  Her stay has been uneventful.  She is ambulating , voiding and taking PO's well.  Incisions are clean and dry.  Palpable DP pulse with doppler signals anterior tib/ PT.      Hospital Course:  [name] is a 72 y.o. female is S/P Left Procedure(s): BYPASS GRAFT FEMORAL-POPLITEAL ARTERY Extubated: POD # 0 Physical exam: skin warm with palpable DP pulse Post-op wounds clean, dry, intact or healing well Pt. Ambulating, voiding and taking PO  diet without difficulty. Pt pain controlled with PO pain meds. Labs as below Complications:none  Consults:     Significant Diagnostic Studies: CBC Lab Results  Component Value Date   WBC 8.8 10/13/2012   HGB 11.6* 10/13/2012   HCT 35.1* 10/13/2012   MCV 95.1 10/13/2012   PLT 156 10/13/2012    BMET    Component Value Date/Time   NA 142 10/13/2012 0400   K 4.0 10/13/2012 0400   CL 109 10/13/2012 0400   CO2 26 10/13/2012 0400   GLUCOSE 103* 10/13/2012 0400   BUN 12 10/13/2012 0400   CREATININE 0.87 10/13/2012 0400   CALCIUM 8.1* 10/13/2012 0400   GFRNONAA 66* 10/13/2012 0400   GFRAA 76* 10/13/2012 0400   COAG Lab Results  Component Value Date   INR 1.01 10/10/2012     Disposition:  Discharge to :Home Discharge Orders   Future Appointments Provider Department Dept Phone   03/20/2013 2:30 PM [facility] [place] [facility] [address] [phone]   03/20/2013 3:30 PM [facility] [place] [facility] [address] [phone]   03/20/2013 4:00 PM Pryor Ochoa, MD [facility] [phone]   Future Orders Complete By Expires   Call MD for:  redness, tenderness, or signs of infection (pain, swelling, bleeding, redness, odor or green/yellow discharge around incision site)  As directed    Call MD for:  severe or increased pain, loss or decreased feeling  in affected limb(s)  As directed    Call MD  for:  temperature >100.5  As directed    Discharge instructions  As directed    Comments:     Keep left groin area dry.  Elevate left le when at rest.   Driving Restrictions  As directed    Comments:     No driving for 4 weeks   Lifting restrictions  As directed    Comments:     No lifting for 8 weeks   Resume previous diet  As directed        Medication List         ADVIL 200 MG tablet  Generic drug:  ibuprofen  Take 400 mg by mouth daily as needed for pain.     aspirin 81 MG tablet  Take 81 mg by mouth daily.      clopidogrel 75 MG tablet  Commonly known as:  PLAVIX  Take 75 mg by mouth daily.     losartan 50 MG tablet  Commonly known as:  COZAAR  Take 50 mg by mouth Daily.     oxyCODONE-acetaminophen 5-325 MG per tablet  Commonly known as:  PERCOCET/ROXICET  Take 1 tablet by mouth every 4 (four) hours as needed for pain.     simvastatin 80 MG tablet  Commonly known as:  ZOCOR  Take 80 mg by mouth at bedtime.       Verbal and written Discharge instructions given to the patient. Wound care per Discharge AVS   Signed: Clinton Gallant [facility] 10/14/2012, 8:50 AM  - For VQI Registry use --- Instructions: Press F2 to tab through selections.  Delete question if not applicable.   Post-op:  Wound infection: No  Graft infection: No  Transfusion: No  If yes, 0 units given New Arrhythmia: No Ipsilateral amputation: [x ] no, [ ]  Minor, [ ]  BKA, [ ]  AKA Discharge patency: [x ] Primary, [ ]  Primary assisted, [ ]  Secondary, [ ]  Occluded Patency judged by: [ ]  Dopper only, [ ]  Palpable graft pulse, [x ] Palpable distal pulse, [ ]  ABI inc. > 0.15, [ ]  Duplex Discharge ABI: R 0.88, L 0.59 D/C Ambulatory Status: Ambulatory  Complications: MI: [x ] No, [ ]  Troponin only, [ ]  EKG or Clinical CHF: No Resp failure: [x ] none, [ ]  Pneumonia, [ ]  Ventilator Chg in renal function: [x ] none, [ ]  Inc. Cr > 0.5, [ ]  Temp. Dialysis, [ ]  Permanent dialysis Stroke: [x ] None, [ ]  Minor, [ ]  Major Return to OR: No  Reason for return to OR: [ ]  Bleeding, [ ]  Infection, [ ]  Thrombosis, [ ]  Revision  Discharge medications: Statin use:  Yes ASA use:  Yes Plavix use:  yes Beta blocker use: No  for medical reason   Coumadin use: No  for medical reason

## 2012-10-14 NOTE — Progress Notes (Signed)
Physical Therapy Treatment Patient Details Name: [name] MRN: [id number] DOB: 23-Aug-1940 Today's Date: 10/14/2012 Time: 3086-5784 PT Time Calculation (min): 10 min  PT Assessment / Plan / Recommendation  History of Present Illness Patient is a 72 yo female admitted with LLE claudication.  Patient is s/p LLE bypass graft.   PT Comments   Pt moving well today and is ready for D/C to home from PT standpoint.    Follow Up Recommendations  No PT follow up;Supervision - Intermittent     Does the patient have the potential to tolerate intense rehabilitation     Barriers to Discharge        Equipment Recommendations  None recommended by PT    Recommendations for Other Services    Frequency Min 3X/week   Progress towards PT Goals Progress towards PT goals: Progressing toward goals  Plan Current plan remains appropriate    Precautions / Restrictions Precautions Precautions: None Restrictions Weight Bearing Restrictions: No   Pertinent Vitals/Pain "Only stiff, not pain."  L LE    Mobility  Bed Mobility Bed Mobility: Not assessed Transfers Transfers: Sit to Stand;Stand to Sit Sit to Stand: 6: Modified independent (Device/Increase time);With upper extremity assist;From chair/3-in-1;From toilet Stand to Sit: 6: Modified independent (Device/Increase time);With upper extremity assist;To chair/3-in-1;To toilet Details for Transfer Assistance: Demos good technique.  pt Mod I with toilet transfer as well.   Ambulation/Gait Ambulation/Gait Assistance: 5: Supervision Ambulation Distance (Feet): 140 Feet Assistive device: Rolling walker Ambulation/Gait Assistance Details: cues for increased heel strike on L LE, positioning within RW, upright posture.   Gait Pattern: Step-through pattern;Decreased step length - right;Decreased stance time - left Stairs: No Wheelchair Mobility Wheelchair Mobility: No    Exercises     PT Diagnosis:    PT Problem List:   PT Treatment  Interventions:     PT Goals (current goals can now be found in the care plan section) Acute Rehab PT Goals Patient Stated Goal: To go home tomorrow Time For Goal Achievement: 10/20/12 Potential to Achieve Goals: Good  Visit Information  Last PT Received On: 10/14/12 Assistance Needed: +1 History of Present Illness: Patient is a 72 yo female admitted with LLE claudication.  Patient is s/p LLE bypass graft.    Subjective Data  Patient Stated Goal: To go home tomorrow   Cognition  Cognition Arousal/Alertness: Awake/alert Behavior During Therapy: WFL for tasks assessed/performed Overall Cognitive Status: Within Functional Limits for tasks assessed    Balance  Balance Balance Assessed: No  End of Session PT - End of Session Equipment Utilized During Treatment: Gait belt Activity Tolerance: Patient tolerated treatment well Patient left: in chair;with call bell/phone within reach Nurse Communication: Mobility status   GP     [name], [place] [phone] 10/14/2012, 10:01 AM

## 2012-10-14 NOTE — Progress Notes (Signed)
Discharge education given to pt along with prescription. Pt verbalized her understanding of discharge instructions and medications. Pt is stable for discharge with son. Will provide wheelchair to escort pt to car.

## 2012-10-16 ENCOUNTER — Telehealth: Payer: Self-pay | Admitting: Vascular Surgery

## 2012-10-16 NOTE — Telephone Encounter (Addendum)
Message copied by Fredrich Birks on Mon Oct 16, 2012  2:09 PM ------      Message from: Phillips Odor      Created: Mon Oct 16, 2012 11:00 AM                   ----- Message -----         From: Lars Mage, PA-C         Sent: 10/14/2012   8:58 AM           To: Melene Plan, RN            F/U in 4 weeks s/p left fem-pop by pass.  Dr. Hart Rochester ------  10/16/12: spoke with pt to schedule appt on 11/14/2012 @ 9:00am, dpm

## 2012-10-17 NOTE — Discharge Summary (Signed)
I agree with the above  WElls Avory Mimbs 

## 2012-10-19 ENCOUNTER — Other Ambulatory Visit: Payer: Self-pay | Admitting: *Deleted

## 2012-11-13 ENCOUNTER — Encounter: Payer: Self-pay | Admitting: Vascular Surgery

## 2012-11-14 ENCOUNTER — Ambulatory Visit (INDEPENDENT_AMBULATORY_CARE_PROVIDER_SITE_OTHER): Payer: Self-pay | Admitting: Vascular Surgery

## 2012-11-14 ENCOUNTER — Encounter: Payer: Self-pay | Admitting: Vascular Surgery

## 2012-11-14 VITALS — BP 142/75 | HR 72 | Resp 16 | Ht 63.0 in | Wt 159.0 lb

## 2012-11-14 DIAGNOSIS — I739 Peripheral vascular disease, unspecified: Secondary | ICD-10-CM

## 2012-11-14 DIAGNOSIS — I6529 Occlusion and stenosis of unspecified carotid artery: Secondary | ICD-10-CM

## 2012-11-14 DIAGNOSIS — Z48812 Encounter for surgical aftercare following surgery on the circulatory system: Secondary | ICD-10-CM

## 2012-11-14 NOTE — Addendum Note (Signed)
Addended by: Sharee Pimple on: 11/14/2012 11:54 AM   Modules accepted: Orders

## 2012-11-14 NOTE — Progress Notes (Signed)
Subjective:     Patient ID: [name], female   DOB: October 12, 1940, 72 y.o.   MRN: [id number]  HPI this 72year-old female returns for initial followup regarding her left femoral-popliteal bypass graft done 2 weeks ago with saphenous vein. She had severe claudication unable to walk 100 feet. She has done relatively well since her surgery with the exception of some swelling from the knee to the ankle. Her ambulation has improved  Review of Systems     Objective:   Physical Exam BP 142/75  Pulse 72  Resp 16  Ht 5\' 3"  (1.6 m)  Wt 159 lb (72.122 kg)  BMI 28.17 kg/m2  General well-developed well-nourished female no apparent stress or going x3 Left leg with 3+ femoral and 2+ popliteal pulse palpable. There is 1+ edema from the knee to the ankle. Incisions are healing nicely and left foot is well-perfused.      Assessment:     Status post left femoral-popliteal saphenous vein graft -functioning nicely-1+ edema left leg    Plan:     Return in February 20/15 for duplex scan of left femoral-popliteal graft and check ABIs and patient also due to have carotid duplex at that time

## 2013-03-19 ENCOUNTER — Encounter: Payer: Self-pay | Admitting: Vascular Surgery

## 2013-03-20 ENCOUNTER — Ambulatory Visit (INDEPENDENT_AMBULATORY_CARE_PROVIDER_SITE_OTHER)
Admission: RE | Admit: 2013-03-20 | Discharge: 2013-03-20 | Disposition: A | Discharge status: Home / Self Care | Payer: Medicare Other | Source: Ambulatory Visit | Attending: Vascular Surgery | Admitting: Vascular Surgery

## 2013-03-20 ENCOUNTER — Ambulatory Visit (INDEPENDENT_AMBULATORY_CARE_PROVIDER_SITE_OTHER): Payer: Medicare Other | Admitting: Vascular Surgery

## 2013-03-20 ENCOUNTER — Encounter: Payer: Self-pay | Admitting: Vascular Surgery

## 2013-03-20 ENCOUNTER — Ambulatory Visit (HOSPITAL_COMMUNITY)
Admission: RE | Admit: 2013-03-20 | Discharge: 2013-03-20 | Disposition: A | Discharge status: Home / Self Care | Payer: Medicare Other | Source: Ambulatory Visit | Attending: Vascular Surgery | Admitting: Vascular Surgery

## 2013-03-20 VITALS — BP 143/66 | HR 77 | Resp 16 | Ht 64.5 in | Wt 159.0 lb

## 2013-03-20 DIAGNOSIS — I739 Peripheral vascular disease, unspecified: Secondary | ICD-10-CM

## 2013-03-20 DIAGNOSIS — I70219 Atherosclerosis of native arteries of extremities with intermittent claudication, unspecified extremity: Secondary | ICD-10-CM

## 2013-03-20 DIAGNOSIS — Z48812 Encounter for surgical aftercare following surgery on the circulatory system: Secondary | ICD-10-CM | POA: Insufficient documentation

## 2013-03-20 DIAGNOSIS — I658 Occlusion and stenosis of other precerebral arteries: Secondary | ICD-10-CM | POA: Insufficient documentation

## 2013-03-20 DIAGNOSIS — I6529 Occlusion and stenosis of unspecified carotid artery: Secondary | ICD-10-CM

## 2013-03-20 NOTE — Progress Notes (Signed)
Subjective:     Patient ID: [name], female   DOB: 05-23-1940, 73 y.o.   MRN: [id number]  HPI this 73 year old female is 6 months status post left femoral popliteal saphenous vein graft. Her claudication symptoms are completely relieved. She does have persistent edema from the ankle to the knee. She does elevate her legs at night and intermittently during the day. She thinks it is slowly improving. She has no other complaints. She denies lateralizing weakness, aphasia, amaurosis fugax, diplopia, blurred vision, or syncope.  Past Medical History  Diagnosis Date  . Hyperlipidemia   . 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 Hypertension   . Carotid artery occlusion   . Stroke 2008  . Diabetes mellitus     borderline , no longer treated /w medicine     History  Substance Use Topics  . Smoking status: Current Some Day Smoker -- 0.25 packs/day for 50 years    Types: Cigarettes  . Smokeless tobacco: Never Used     Comment: pt states she smokes about 4-5 cigs per day and is trying to quit  . Alcohol Use: No    Family History  Problem Relation Age of Onset  . Coronary artery disease Mother   . Heart disease Mother   . Heart attack Mother   . Coronary artery disease Sister   . Heart disease Sister   . Heart disease Father   . Other Father     amputation    No Known Allergies  Current outpatient prescriptions:aspirin 81 MG tablet, Take 81 mg by mouth daily., Disp: , Rfl: ;  clopidogrel (PLAVIX) 75 MG tablet, Take 75 mg by mouth daily., Disp: , Rfl: ;  ibuprofen (ADVIL) 200 MG tablet, Take 400 mg by mouth daily as needed for pain., Disp: , Rfl: ;  losartan (COZAAR) 50 MG tablet, Take 50 mg by mouth Daily., Disp: , Rfl:  oxyCODONE-acetaminophen (PERCOCET/ROXICET) 5-325 MG per tablet, Take 1 tablet by mouth every 4 (four) hours as needed for pain., Disp: 40 tablet, Rfl: 0;  simvastatin (ZOCOR) 80 MG tablet,  Take 80 mg by mouth at bedtime., Disp: , Rfl:   BP 143/66  Pulse 77  Resp 16  Ht 5' 4.5" (1.638 m)  Wt 159 lb (72.122 kg)  BMI 26.88 kg/m2  Body mass index is 26.88 kg/(m^2).           Review of Systems denies chest pain, dyspnea on exertion, PND, orthopnea, hemoptysis, claudication. Other systems negative complete review of systems     Objective:   Physical Exam BP 143/66  Pulse 77  Resp 16  Ht 5' 4.5" (1.638 m)  Wt 159 lb (72.122 kg)  BMI 26.88 kg/m2  Gen.-alert and oriented x3 in no apparent distress HEENT normal for age Lungs no rhonchi or wheezing Cardiovascular regular rhythm no murmurs carotid pulses 3+ palpable no bruits audible Abdomen soft nontender no palpable masses Musculoskeletal free of  major deformities Skin clear -no rashes Neurologic normal Lower extremities 3+ femoral and dorsalis pedis pulses palpable bilaterally with no edema right leg 1+ edema left leg from knee to foot  Today I ordered a carotid duplex exam which are reviewed and interpreted. There is an approximate 60-70% left ICA stenosis and a 50% right ICA stenosis. The left carotid velocities have slightly increased compared to previous study one year ago. Duplex scan of left femoral-popliteal graft was also obtained and  it reveals a graft to be widely patent with excellent flow distally ABI approximately 1.0 bilaterally with triphasic flow       Assessment:     Nicely functioning left femoral-popliteal saphenous vein graft with resolution of claudication but some persistent postoperative edema Moderate bilateral carotid occlusive disease with 60-70% left ICA stenosis-asymptomatic    Plan:     Return in 6 months for duplex scan of lower extremity bypass graft and ABIs and return in one year for carotid duplex exam Differential edema persists or worsens she can call the office to arrange being fitted for short leg 20-30 mm elastic compression stocking to be placed for sling in the  morning after elevating the leg at night

## 2013-03-26 ENCOUNTER — Other Ambulatory Visit: Payer: Self-pay | Admitting: *Deleted

## 2013-03-26 DIAGNOSIS — I6529 Occlusion and stenosis of unspecified carotid artery: Secondary | ICD-10-CM

## 2013-03-26 DIAGNOSIS — I739 Peripheral vascular disease, unspecified: Secondary | ICD-10-CM

## 2013-10-01 ENCOUNTER — Encounter: Payer: Self-pay | Admitting: Family

## 2013-10-02 ENCOUNTER — Ambulatory Visit (INDEPENDENT_AMBULATORY_CARE_PROVIDER_SITE_OTHER)
Admission: RE | Admit: 2013-10-02 | Discharge: 2013-10-02 | Disposition: A | Discharge status: Home / Self Care | Payer: Medicare Other | Source: Ambulatory Visit | Attending: Family | Admitting: Family

## 2013-10-02 ENCOUNTER — Ambulatory Visit (INDEPENDENT_AMBULATORY_CARE_PROVIDER_SITE_OTHER): Payer: Medicare Other | Admitting: Family

## 2013-10-02 ENCOUNTER — Encounter: Payer: Self-pay | Admitting: Family

## 2013-10-02 ENCOUNTER — Ambulatory Visit (HOSPITAL_COMMUNITY)
Admission: RE | Admit: 2013-10-02 | Discharge: 2013-10-02 | Disposition: A | Discharge status: Home / Self Care | Payer: Medicare Other | Source: Ambulatory Visit | Attending: Family | Admitting: Family

## 2013-10-02 VITALS — BP 128/75 | HR 71 | Resp 16 | Ht 65.0 in | Wt 162.0 lb

## 2013-10-02 DIAGNOSIS — I739 Peripheral vascular disease, unspecified: Secondary | ICD-10-CM | POA: Diagnosis not present

## 2013-10-02 DIAGNOSIS — I6529 Occlusion and stenosis of unspecified carotid artery: Secondary | ICD-10-CM

## 2013-10-02 DIAGNOSIS — Z48812 Encounter for surgical aftercare following surgery on the circulatory system: Secondary | ICD-10-CM | POA: Insufficient documentation

## 2013-10-02 DIAGNOSIS — M7989 Other specified soft tissue disorders: Secondary | ICD-10-CM | POA: Insufficient documentation

## 2013-10-02 NOTE — Progress Notes (Signed)
[facility] [place] HISTORY AND PHYSICAL -PAD  History of Present Illness [name] is a 73 y.o. female patient of Dr. Hart Rochester who is s/p Left femoropopliteal saphenous vein graft 10/12/2012, Left superficial femoral artery stent 03/11/2005, Left popliteal artery stent 04/30/2009, and ight superficial femoral artery stent 10/17/2009. She is also being monitored for carotid artery disease. She returns today for follow up.  Her claudication symptoms are completely relieved. She does have persistent edema from the ankle to the knee. She does elevate her legs at night and intermittently during the day. She thinks it is slowly improving. She has no other complaints. She denies claudication symptoms with walking, denies non healing wounds. She had a TIA in 06/12/06 as manifested by weakness in right hand which has resolved, no further TIA or stroke activity.  The patient reports New Medical or Surgical History: her husband died in 06/11/2012, is grieving.   Pt Diabetic: No Pt smoker: smoker  (1/2 ppd, started in her 2022-06-12)  Pt meds include: Statin :Yes ASA: Yes Other anticoagulants/antiplatelets: Plavix  Past Medical History  Diagnosis Date  . Hyperlipidemia   . 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 Hypertension   . Carotid artery occlusion   . Stroke 06-12-2006  . Diabetes mellitus     borderline , no longer treated /w medicine     Social History History  Substance Use Topics  . Smoking status: Current Some Day Smoker -- 0.25 packs/day for 50 years    Types: Cigarettes  . Smokeless tobacco: Never Used     Comment: pt states she smokes about 4-5 cigs per day and is trying to quit  . Alcohol Use: No    Family History Family History  Problem Relation Age of Onset  . Coronary artery disease Mother   . Heart disease Mother   . Heart attack Mother   . Coronary artery disease Sister   .  Heart disease Sister   . Heart disease Father   . Other Father     amputation    Past Surgical History  Procedure Laterality Date  . Tubal ligation    . Right sfa stent   06/11/2008  . Left popliteal stent   04-30-2009  . Inguinal hernia repair      right  . Sfa stent  04-2005    LEFT  . Femoral-popliteal bypass graft Left 10/12/2012    Procedure: BYPASS GRAFT FEMORAL-POPLITEAL ARTERY;  Surgeon: Pryor Ochoa, MD;  Location: [facility] OR;  Service: Vascular;  Laterality: Left;    No Known Allergies  Current Outpatient Prescriptions  Medication Sig Dispense Refill  . aspirin 81 MG tablet Take 81 mg by mouth daily.      . clopidogrel (PLAVIX) 75 MG tablet Take 75 mg by mouth daily.      Marland Kitchen ibuprofen (ADVIL) 200 MG tablet Take 400 mg by mouth daily as needed for pain.      Marland Kitchen losartan (COZAAR) 50 MG tablet Take 50 mg by mouth Daily.      . simvastatin (ZOCOR) 80 MG tablet Take 80 mg by mouth at bedtime.      Marland Kitchen oxyCODONE-acetaminophen (PERCOCET/ROXICET) 5-325 MG per tablet Take 1 tablet by mouth every 4 (four) hours as needed for pain.  40 tablet  0   No current facility-administered medications for this visit.    ROS: See HPI for pertinent positives and  negatives.   Physical Examination  Filed Vitals:   10/02/13 1320  BP: 128/75  Pulse: 71  Resp: 16  Height: 5\' 5"  (1.651 m)  Weight: 162 lb (73.483 kg)  SpO2: 97%   Body mass index is 26.96 kg/(m^2).  General: A&O x 3, WDWN. Gait: normal Eyes: PERRLA. Pulmonary: CTAB, without wheezes , rales or rhonchi. Cardiac: regular Rythm , without detected murmur.         Carotid Bruits Right Left   Negative Negative  Aorta is not palpable. Radial pulses: are 1+ palpable and =                           VASCULAR EXAM: Extremities without ischemic changes  without Gangrene; without open wounds. Left lower leg has 1-2+ pitting edema.                                                                                                           LE Pulses Right Left       FEMORAL   palpable   palpable        POPLITEAL  not palpable   not palpable       POSTERIOR TIBIAL  not palpable   not palpable        DORSALIS PEDIS      ANTERIOR TIBIAL faintly palpable  faintly palpable    Abdomen: soft, NT, no masses. Skin: no rashes, no ulcers noted. Musculoskeletal: no muscle wasting or atrophy.  Neurologic: A&O X 3; Appropriate Affect ; SENSATION: normal; MOTOR FUNCTION:  moving all extremities equally, motor strength 5/5 throughout. Speech is fluent/normal. CN 2-12 intact.    Non-Invasive Vascular Imaging: DATE: 10/02/2013 LOWER EXTREMITY ARTERIAL DUPLEX EVALUATION    INDICATION: Peripheral vascular disease     PREVIOUS INTERVENTION(S): Left femoropopliteal bypass graft 10/12/2012 Left superficial femoral artery stent 03/11/2005 Left popliteal artery stent 04/30/2009 Right superficial femoral artery stent 10/17/2009    DUPLEX EXAM:     RIGHT  LEFT   Peak Systolic Velocity (cm/s) Ratio (if abnormal) Waveform  Peak Systolic Velocity (cm/s) Ratio (if abnormal) Waveform     Inflow Artery 148  T      Proximal Anastomosis 189  T     Proximal Graft 59  T     Mid Graft 67  B      Distal Graft 74  B     Distal Anastomosis 217 2.93 T     Outflow Artery 99  B  0.93 Today's ABI / TBI 0.87  1.05 Previous ABI / TBI (03/20/2013  ) 0.99    Waveform:    M - Monophasic       B - Biphasic       T - Triphasic  If Ankle Brachial Index (ABI) or Toe Brachial Index (TBI) performed, please see complete report  ADDITIONAL FINDINGS:     IMPRESSION: Patent left femoropopliteal bypass graft with elevated velocities noted at the distal anastomosis suggestive of a 50-70% stenosis.    Compared to the  previous exam:  The bilateral ABIs show a mild decline since the last exam.     ASSESSMENT: [name] is a 73 y.o. female who is s/p Left femoropopliteal bypass graft 10/12/2012, Left superficial femoral artery stent 03/11/2005, Left popliteal  artery stent 04/30/2009, and Right superficial femoral artery stent 10/17/2009.  She denies claudication symptoms with walking, denies non healing wounds. She had a TIA in 06-12-2006 as manifested by weakness in right hand which has resolved, no further TIA or stroke activity. Today's left LE arterial Duplex reveals a patent left femoropopliteal bypass graft with elevated velocities noted at the distal anastomosis suggestive of a 50-70% stenosis. The bilateral ABIs show a mild decline since the last exam. She has resumed smoking since her husband died in June 11, 2012, is grieving his loss.  PLAN:  She was counseled re smoking cessation. I discussed in depth with the patient the nature of atherosclerosis, and emphasized the importance of maximal medical management including strict control of blood pressure, blood glucose, and lipid levels, obtaining regular exercise, and cessation of smoking.  The patient is aware that without maximal medical management the underlying atherosclerotic disease process will progress, limiting the benefit of any interventions.  Based on the patient's vascular studies and examination, pt will return to clinic on 10/16/13 to discuss with Dr. Hart Rochester whether left LE graft needs further intervention or diagnostics. She should already be scheduled for a carotid Duplex in 6 months. The patient was given information about PAD including signs, symptoms, treatment, what symptoms should prompt the patient to seek immediate medical care, and risk reduction measures to take.  Charisse March, RN, MSN, FNP-C [facility] [place] Phone: [phone]  Clinic MD: Early  10/02/2013 1:44 PM

## 2013-10-02 NOTE — Patient Instructions (Signed)
Peripheral Vascular Disease Peripheral Vascular Disease (PVD), also called Peripheral Arterial Disease (PAD), is a circulation problem caused by cholesterol (atherosclerotic plaque) deposits in the arteries. PVD commonly occurs in the lower extremities (legs) but it can occur in other areas of the body, such as your arms. The cholesterol buildup in the arteries reduces blood flow which can cause pain and other serious problems. The presence of PVD can place a person at risk for Coronary Artery Disease (CAD).  CAUSES  Causes of PVD can be many. It is usually associated with more than one risk factor such as:   High Cholesterol.  Smoking.  Diabetes.  Lack of exercise or inactivity.  High blood pressure (hypertension).  Obesity.  Family history. SYMPTOMS   When the lower extremities are affected, patients with PVD may experience:  Leg pain with exertion or physical activity. This is called INTERMITTENT CLAUDICATION. This may present as cramping or numbness with physical activity. The location of the pain is associated with the level of blockage. For example, blockage at the abdominal level (distal abdominal aorta) may result in buttock or hip pain. Lower leg arterial blockage may result in calf pain.  As PVD becomes more severe, pain can develop with less physical activity.  In people with severe PVD, leg pain may occur at rest.  Other PVD signs and symptoms:  Leg numbness or weakness.  Coldness in the affected leg or foot, especially when compared to the other leg.  A change in leg color.  Patients with significant PVD are more prone to ulcers or sores on toes, feet or legs. These may take longer to heal or may reoccur. The ulcers or sores can become infected.  If signs and symptoms of PVD are ignored, gangrene may occur. This can result in the loss of toes or loss of an entire limb.  Not all leg pain is related to PVD. Other medical conditions can cause leg pain such  as:  Blood clots (embolism) or Deep Vein Thrombosis.  Inflammation of the blood vessels (vasculitis).  Spinal stenosis. DIAGNOSIS  Diagnosis of PVD can involve several different types of tests. These can include:  Pulse Volume Recording Method (PVR). This test is simple, painless and does not involve the use of X-rays. PVR involves measuring and comparing the blood pressure in the arms and legs. An ABI (Ankle-Brachial Index) is calculated. The normal ratio of blood pressures is 1. As this number becomes smaller, it indicates more severe disease.  < 0.95 - indicates significant narrowing in one or more leg vessels.  <0.8 - there will usually be pain in the foot, leg or buttock with exercise.  <0.4 - will usually have pain in the legs at rest.  <0.25 - usually indicates limb threatening PVD.  Doppler detection of pulses in the legs. This test is painless and checks to see if you have a pulses in your legs/feet.  A dye or contrast material (a substance that highlights the blood vessels so they show up on x-ray) may be given to help your caregiver better see the arteries for the following tests. The dye is eliminated from your body by the kidney's. Your caregiver may order blood work to check your kidney function and other laboratory values before the following tests are performed:  Magnetic Resonance Angiography (MRA). An MRA is a picture study of the blood vessels and arteries. The MRA machine uses a large magnet to produce images of the blood vessels.  Computed Tomography Angiography (CTA). A CTA   is a specialized x-ray that looks at how the blood flows in your blood vessels. An IV may be inserted into your arm so contrast dye can be injected.  Angiogram. Is a procedure that uses x-rays to look at your blood vessels. This procedure is minimally invasive, meaning a small incision (cut) is made in your groin. A small tube (catheter) is then inserted into the artery of your groin. The catheter  is guided to the blood vessel or artery your caregiver wants to examine. Contrast dye is injected into the catheter. X-rays are then taken of the blood vessel or artery. After the images are obtained, the catheter is taken out. TREATMENT  Treatment of PVD involves many interventions which may include:  Lifestyle changes:  Quitting smoking.  Exercise.  Following a low fat, low cholesterol diet.  Control of diabetes.  Foot care is very important to the PVD patient. Good foot care can help prevent infection.  Medication:  Cholesterol-lowering medicine.  Blood pressure medicine.  Anti-platelet drugs.  Certain medicines may reduce symptoms of Intermittent Claudication.  Interventional/Surgical options:  Angioplasty. An Angioplasty is a procedure that inflates a balloon in the blocked artery. This opens the blocked artery to improve blood flow.  Stent Implant. A wire mesh tube (stent) is placed in the artery. The stent expands and stays in place, allowing the artery to remain open.  Peripheral Bypass Surgery. This is a surgical procedure that reroutes the blood around a blocked artery to help improve blood flow. This type of procedure may be performed if Angioplasty or stent implants are not an option. SEEK IMMEDIATE MEDICAL CARE IF:   You develop pain or numbness in your arms or legs.  Your arm or leg turns cold, becomes blue in color.  You develop redness, warmth, swelling and pain in your arms or legs. MAKE SURE YOU:   Understand these instructions.  Will watch your condition.  Will get help right away if you are not doing well or get worse. Document Released: 01/29/2004 Document Revised: 03/15/2011 Document Reviewed: 12/26/2007 Exit[employer] Patient Information 2015 [employer]. This information is not intended to replace advice given to you by your health care provider. Make sure you discuss any questions you have with your health care provider.   Stroke  Prevention Some medical conditions and behaviors are associated with an increased chance of having a stroke. You may prevent a stroke by making healthy choices and managing medical conditions. HOW CAN I REDUCE MY RISK OF HAVING A STROKE?   Stay physically active. Get at least 30 minutes of activity on most or all days.  Do not smoke. It may also be helpful to avoid exposure to secondhand smoke.  Limit alcohol use. Moderate alcohol use is considered to be:  No more than 2 drinks per day for men.  No more than 1 drink per day for nonpregnant women.  Eat healthy foods. This involves:  Eating 5 or more servings of fruits and vegetables a day.  Making dietary changes that address high blood pressure (hypertension), high cholesterol, diabetes, or obesity.  Manage your cholesterol levels.  Making food choices that are high in fiber and low in saturated fat, trans fat, and cholesterol may control cholesterol levels.  Take any prescribed medicines to control cholesterol as directed by your health care provider.  Manage your diabetes.  Controlling your carbohydrate and sugar intake is recommended to manage diabetes.  Take any prescribed medicines to control diabetes as directed by your health care provider.    Control your hypertension.  Making food choices that are low in salt (sodium), saturated fat, trans fat, and cholesterol is recommended to manage hypertension.  Take any prescribed medicines to control hypertension as directed by your health care provider.  Maintain a healthy weight.  Reducing calorie intake and making food choices that are low in sodium, saturated fat, trans fat, and cholesterol are recommended to manage weight.  Stop drug abuse.  Avoid taking birth control pills.  Talk to your health care provider about the risks of taking birth control pills if you are over 35 years old, smoke, get migraines, or have ever had a blood clot.  Get evaluated for sleep  disorders (sleep apnea).  Talk to your health care provider about getting a sleep evaluation if you snore a lot or have excessive sleepiness.  Take medicines only as directed by your health care provider.  For some people, aspirin or blood thinners (anticoagulants) are helpful in reducing the risk of forming abnormal blood clots that can lead to stroke. If you have the irregular heart rhythm of atrial fibrillation, you should be on a blood thinner unless there is a good reason you cannot take them.  Understand all your medicine instructions.  Make sure that other conditions (such as anemia or atherosclerosis) are addressed. SEEK IMMEDIATE MEDICAL CARE IF:   You have sudden weakness or numbness of the face, arm, or leg, especially on one side of the body.  Your face or eyelid droops to one side.  You have sudden confusion.  You have trouble speaking (aphasia) or understanding.  You have sudden trouble seeing in one or both eyes.  You have sudden trouble walking.  You have dizziness.  You have a loss of balance or coordination.  You have a sudden, severe headache with no known cause.  You have new chest pain or an irregular heartbeat. Any of these symptoms may represent a serious problem that is an emergency. Do not wait to see if the symptoms will go away. Get medical help at once. Call your local emergency services (911 in [place] Do not drive yourself to the hospital. Document Released: 01/29/2004 Document Revised: 05/07/2013 Document Reviewed: 06/23/2012 [employer] Patient Information 2015 [employer]. This information is not intended to replace advice given to you by your health care provider. Make sure you discuss any questions you have with your health care provider.   Smoking Cessation Quitting smoking is important to your health and has many advantages. However, it is not always easy to quit since nicotine is a very addictive drug. Oftentimes, people try 3 times or more  before being able to quit. This document explains the best ways for you to prepare to quit smoking. Quitting takes hard work and a lot of effort, but you can do it. ADVANTAGES OF QUITTING SMOKING  You will live longer, feel better, and live better.  Your body will feel the impact of quitting smoking almost immediately.  Within 20 minutes, blood pressure decreases. Your pulse returns to its normal level.  After 8 hours, carbon monoxide levels in the blood return to normal. Your oxygen level increases.  After 24 hours, the chance of having a heart attack starts to decrease. Your breath, hair, and body stop smelling like smoke.  After 48 hours, damaged nerve endings begin to recover. Your sense of taste and smell improve.  After 72 hours, the body is virtually free of nicotine. Your bronchial tubes relax and breathing becomes easier.  After 2 to 12   weeks, lungs can hold more air. Exercise becomes easier and circulation improves.  The risk of having a heart attack, stroke, cancer, or lung disease is greatly reduced.  After 1 year, the risk of coronary heart disease is cut in half.  After 5 years, the risk of stroke falls to the same as a nonsmoker.  After 10 years, the risk of lung cancer is cut in half and the risk of other cancers decreases significantly.  After 15 years, the risk of coronary heart disease drops, usually to the level of a nonsmoker.  If you are pregnant, quitting smoking will improve your chances of having a healthy baby.  The people you live with, especially any children, will be healthier.  You will have extra money to spend on things other than cigarettes. QUESTIONS TO THINK ABOUT BEFORE ATTEMPTING TO QUIT You may want to talk about your answers with your health care provider.  Why do you want to quit?  If you tried to quit in the past, what helped and what did not?  What will be the most difficult situations for you after you quit? How will you plan to  handle them?  Who can help you through the tough times? Your family? Friends? A health care provider?  What pleasures do you get from smoking? What ways can you still get pleasure if you quit? Here are some questions to ask your health care provider:  How can you help me to be successful at quitting?  What medicine do you think would be best for me and how should I take it?  What should I do if I need more help?  What is smoking withdrawal like? How can I get information on withdrawal? GET READY  Set a quit date.  Change your environment by getting rid of all cigarettes, ashtrays, matches, and lighters in your home, car, or work. Do not let people smoke in your home.  Review your past attempts to quit. Think about what worked and what did not. GET SUPPORT AND ENCOURAGEMENT You have a better chance of being successful if you have help. You can get support in many ways.  Tell your family, friends, and coworkers that you are going to quit and need their support. Ask them not to smoke around you.  Get individual, group, or telephone counseling and support. Programs are available at local hospitals and health centers. Call your local health department for information about programs in your area.  Spiritual beliefs and practices may help some smokers quit.  Download a "quit meter" on your computer to keep track of quit statistics, such as how long you have gone without smoking, cigarettes not smoked, and money saved.  Get a self-help book about quitting smoking and staying off tobacco. LEARN NEW SKILLS AND BEHAVIORS  Distract yourself from urges to smoke. Talk to someone, go for a walk, or occupy your time with a task.  Change your normal routine. Take a different route to work. Drink tea instead of coffee. Eat breakfast in a different place.  Reduce your stress. Take a hot bath, exercise, or read a book.  Plan something enjoyable to do every day. Reward yourself for not  smoking.  Explore interactive web-based programs that specialize in helping you quit. GET MEDICINE AND USE IT CORRECTLY Medicines can help you stop smoking and decrease the urge to smoke. Combining medicine with the above behavioral methods and support can greatly increase your chances of successfully quitting smoking.  Nicotine replacement therapy   helps deliver nicotine to your body without the negative effects and risks of smoking. Nicotine replacement therapy includes nicotine gum, lozenges, inhalers, nasal sprays, and skin patches. Some may be available over-the-counter and others require a prescription.  Antidepressant medicine helps people abstain from smoking, but how this works is unknown. This medicine is available by prescription.  Nicotinic receptor partial agonist medicine simulates the effect of nicotine in your brain. This medicine is available by prescription. Ask your health care provider for advice about which medicines to use and how to use them based on your health history. Your health care provider will tell you what side effects to look out for if you choose to be on a medicine or therapy. Carefully read the information on the package. Do not use any other product containing nicotine while using a nicotine replacement product.  RELAPSE OR DIFFICULT SITUATIONS Most relapses occur within the first 3 months after quitting. Do not be discouraged if you start smoking again. Remember, most people try several times before finally quitting. You may have symptoms of withdrawal because your body is used to nicotine. You may crave cigarettes, be irritable, feel very hungry, cough often, get headaches, or have difficulty concentrating. The withdrawal symptoms are only temporary. They are strongest when you first quit, but they will go away within 10-14 days. To reduce the chances of relapse, try to:  Avoid drinking alcohol. Drinking lowers your chances of successfully quitting.  Reduce the  amount of caffeine you consume. Once you quit smoking, the amount of caffeine in your body increases and can give you symptoms, such as a rapid heartbeat, sweating, and anxiety.  Avoid smokers because they can make you want to smoke.  Do not let weight gain distract you. Many smokers will gain weight when they quit, usually less than 10 pounds. Eat a healthy diet and stay active. You can always lose the weight gained after you quit.  Find ways to improve your mood other than smoking. FOR MORE INFORMATION  www.smokefree.gov  Document Released: 12/15/2000 Document Revised: 05/07/2013 Document Reviewed: 04/01/2011 [employer] Patient Information 2015 [employer]. This information is not intended to replace advice given to you by your health care provider. Make sure you discuss any questions you have with your health care provider.  

## 2013-10-15 ENCOUNTER — Encounter: Payer: Self-pay | Admitting: Vascular Surgery

## 2013-10-16 ENCOUNTER — Encounter: Payer: Self-pay | Admitting: Vascular Surgery

## 2013-10-16 ENCOUNTER — Ambulatory Visit (INDEPENDENT_AMBULATORY_CARE_PROVIDER_SITE_OTHER): Payer: Medicare Other | Admitting: Vascular Surgery

## 2013-10-16 VITALS — BP 150/61 | HR 76 | Temp 98.2°F | Resp 16 | Ht 65.0 in | Wt 163.4 lb

## 2013-10-16 DIAGNOSIS — I6529 Occlusion and stenosis of unspecified carotid artery: Secondary | ICD-10-CM

## 2013-10-16 DIAGNOSIS — Z48812 Encounter for surgical aftercare following surgery on the circulatory system: Secondary | ICD-10-CM

## 2013-10-16 DIAGNOSIS — I739 Peripheral vascular disease, unspecified: Secondary | ICD-10-CM

## 2013-10-16 NOTE — Progress Notes (Addendum)
Subjective:     Patient ID: [name], female   DOB: 1940/04/10, 73 y.o.   MRN: [id number]  HPI this 73 year old female returns today for further evaluation of her left femoral-popliteal bypass graft which are performed 10/12/2012 for severe claudication. She is having no claudication symptoms at the present time. She was seen by Rosalita ChessmanSuzanne a few weeks ago he noticed increased velocity at the distal anastomosis of her vein graft. She was referred back today for further discussion regarding this. Patient has continued to ambulate as much as she wants without calf claudication on either side. She denies pain or numbness in the feet. She has worn short-leg elastic compression stocking since her recent visit and this has helped her swelling considerably. She does elevate her foot of bed at night.  Past Medical History  Diagnosis Date  . Hyperlipidemia   . 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 Hypertension   . Carotid artery occlusion   . Stroke 2008  . Diabetes mellitus     borderline , no longer treated /w medicine     History  Substance Use Topics  . Smoking status: Current Some Day Smoker -- 0.25 packs/day for 50 years    Types: Cigarettes  . Smokeless tobacco: Never Used     Comment: pt states she smokes about 4-5 cigs per day and is trying to quit  . Alcohol Use: No    Family History  Problem Relation Age of Onset  . Coronary artery disease Mother   . Heart disease Mother   . Heart attack Mother   . Coronary artery disease Sister   . Heart disease Sister   . Heart disease Father   . Other Father     amputation    No Known Allergies  Current outpatient prescriptions:aspirin 81 MG tablet, Take 81 mg by mouth daily., Disp: , Rfl: ;  clopidogrel (PLAVIX) 75 MG tablet, Take 75 mg by mouth daily., Disp: , Rfl: ;  ibuprofen (ADVIL) 200 MG tablet, Take 400 mg by mouth daily as needed for pain., Disp: ,  Rfl: ;  losartan (COZAAR) 50 MG tablet, Take 50 mg by mouth Daily., Disp: , Rfl: ;  simvastatin (ZOCOR) 80 MG tablet, Take 80 mg by mouth at bedtime., Disp: , Rfl:   BP 150/61  Pulse 76  Temp(Src) 98.2 F (36.8 C) (Oral)  Resp 16  Ht 5\' 5"  (1.651 m)  Wt 163 lb 6.4 oz (74.118 kg)  BMI 27.19 kg/m2  SpO2 98%  Body mass index is 27.19 kg/(m^2).           Review of Systems denies chest pain, dyspnea on exertion, PND, orthopnea, hemoptysis, lateralizing weakness, aphasia. Other systems negative complete review of systems     Objective:   Physical Exam BP 150/61  Pulse 76  Temp(Src) 98.2 F (36.8 C) (Oral)  Resp 16  Ht 5\' 5"  (1.651 m)  Wt 163 lb 6.4 oz (74.118 kg)  BMI 27.19 kg/m2  SpO2 98%  Gen.-alert and oriented x3 in no apparent distress HEENT normal for age Lungs no rhonchi or wheezing Cardiovascular regular rhythm no murmurs carotid pulses 3+ palpable no bruits audible Abdomen soft nontender no palpable masses Musculoskeletal free of  major deformities Skin clear -no rashes Neurologic normal Lower extremities 3+ femoral and dorsalis pedis pulses palpable bilaterally with no edema  Today I reviewed the duplex scan of the femoral-popliteal graft and  the ABIs which were performed 10/02/2013. There is a velocity of 217 cm/s at the distal anastomosis and the ABI has decreased from 0.9 and 0.87. She continues to have an easily palpable dorsalis pedis pulse.       Assessment:     Nicely functioning left femoral-popliteal saphenous vein graft with slight elevation of velocity and distal anastomosis-asymptomatic Slight diminished ABI from 0.99-0.87 Improving postoperative edema left leg with elastic compression stocking and elevation    Plan:     Return in 6 months for repeat duplex scan left femoral-popliteal vein graft and repeat ABIs as well as carotid duplex exam as previously scheduled If she develops worsening claudication in the interim she will be in touch  with us

## 2013-10-16 NOTE — Patient Instructions (Addendum)
Please review the tobacco cessation information given to you today. It lists many hints that are useful in your effort to stop smoking. The [facility] contact phone # is [phone] These nurses and advisors offer lots of FREE information and aids to help you quit.    The [facility] line #  800-784-8669, they will also assist you with programs designed to help you stop smoking.    Smoking Cessation Quitting smoking is important to your health and has many advantages. However, it is not always easy to quit since nicotine is a very addictive drug. Oftentimes, people try 3 times or more before being able to quit. This document explains the best ways for you to prepare to quit smoking. Quitting takes hard work and a lot of effort, but you can do it. ADVANTAGES OF QUITTING SMOKING  You will live longer, feel better, and live better.  Your body will feel the impact of quitting smoking almost immediately.  Within 20 minutes, blood pressure decreases. Your pulse returns to its normal level.  After 8 hours, carbon monoxide levels in the blood return to normal. Your oxygen level increases.  After 24 hours, the chance of having a heart attack starts to decrease. Your breath, hair, and body stop smelling like smoke.  After 48 hours, damaged nerve endings begin to recover. Your sense of taste and smell improve.  After 72 hours, the body is virtually free of nicotine. Your bronchial tubes relax and breathing becomes easier.  After 2 to 12 weeks, lungs can hold more air. Exercise becomes easier and circulation improves.  The risk of having a heart attack, stroke, cancer, or lung disease is greatly reduced.  After 1 year, the risk of coronary heart disease is cut in half.  After 5 years, the risk of stroke falls to the same as a nonsmoker.  After 10 years, the risk of lung cancer is cut in half and the risk of other cancers decreases significantly.  After 15 years, the risk of  coronary heart disease drops, usually to the level of a nonsmoker.  If you are pregnant, quitting smoking will improve your chances of having a healthy baby.  The people you live with, especially any children, will be healthier.  You will have extra money to spend on things other than cigarettes. QUESTIONS TO THINK ABOUT BEFORE ATTEMPTING TO QUIT You may want to talk about your answers with your health care provider.  Why do you want to quit?  If you tried to quit in the past, what helped and what did not?  What will be the most difficult situations for you after you quit? How will you plan to handle them?  Who can help you through the tough times? Your family? Friends? A health care provider?  What pleasures do you get from smoking? What ways can you still get pleasure if you quit? Here are some questions to ask your health care provider:  How can you help me to be successful at quitting?  What medicine do you think would be best for me and how should I take it?  What should I do if I need more help?  What is smoking withdrawal like? How can I get information on withdrawal? GET READY  Set a quit date.  Change your environment by getting rid of all cigarettes, ashtrays, matches, and lighters in your home, car, or work. Do not let people smoke in your home.  Review your past attempts to quit.   Think about what worked and what did not. GET SUPPORT AND ENCOURAGEMENT You have a better chance of being successful if you have help. You can get support in many ways.  Tell your family, friends, and coworkers that you are going to quit and need their support. Ask them not to smoke around you.  Get individual, group, or telephone counseling and support. Programs are available at local hospitals and health centers. Call your local health department for information about programs in your area.  Spiritual beliefs and practices may help some smokers quit.  Download a "quit meter" on your  computer to keep track of quit statistics, such as how long you have gone without smoking, cigarettes not smoked, and money saved.  Get a self-help book about quitting smoking and staying off tobacco. LEARN NEW SKILLS AND BEHAVIORS  Distract yourself from urges to smoke. Talk to someone, go for a walk, or occupy your time with a task.  Change your normal routine. Take a different route to work. Drink tea instead of coffee. Eat breakfast in a different place.  Reduce your stress. Take a hot bath, exercise, or read a book.  Plan something enjoyable to do every day. Reward yourself for not smoking.  Explore interactive web-based programs that specialize in helping you quit. GET MEDICINE AND USE IT CORRECTLY Medicines can help you stop smoking and decrease the urge to smoke. Combining medicine with the above behavioral methods and support can greatly increase your chances of successfully quitting smoking.  Nicotine replacement therapy helps deliver nicotine to your body without the negative effects and risks of smoking. Nicotine replacement therapy includes nicotine gum, lozenges, inhalers, nasal sprays, and skin patches. Some may be available over-the-counter and others require a prescription.  Antidepressant medicine helps people abstain from smoking, but how this works is unknown. This medicine is available by prescription.  Nicotinic receptor partial agonist medicine simulates the effect of nicotine in your brain. This medicine is available by prescription. Ask your health care provider for advice about which medicines to use and how to use them based on your health history. Your health care provider will tell you what side effects to look out for if you choose to be on a medicine or therapy. Carefully read the information on the package. Do not use any other product containing nicotine while using a nicotine replacement product.  RELAPSE OR DIFFICULT SITUATIONS Most relapses occur within the  first 3 months after quitting. Do not be discouraged if you start smoking again. Remember, most people try several times before finally quitting. You may have symptoms of withdrawal because your body is used to nicotine. You may crave cigarettes, be irritable, feel very hungry, cough often, get headaches, or have difficulty concentrating. The withdrawal symptoms are only temporary. They are strongest when you first quit, but they will go away within 10-14 days. To reduce the chances of relapse, try to:  Avoid drinking alcohol. Drinking lowers your chances of successfully quitting.  Reduce the amount of caffeine you consume. Once you quit smoking, the amount of caffeine in your body increases and can give you symptoms, such as a rapid heartbeat, sweating, and anxiety.  Avoid smokers because they can make you want to smoke.  Do not let weight gain distract you. Many smokers will gain weight when they quit, usually less than 10 pounds. Eat a healthy diet and stay active. You can always lose the weight gained after you quit.  Find ways to improve your mood other   than smoking. FOR MORE INFORMATION  www.smokefree.gov  Document Released: 12/15/2000 Document Revised: 05/07/2013 Document Reviewed: 04/01/2011 [employer] Patient Information 2015 [employer]. This information is not intended to replace advice given to you by your health care provider. Make sure you discuss any questions you have with your health care provider.  Smoking Cessation, Tips for Success If you are ready to quit smoking, congratulations! You have chosen to help yourself be healthier. Cigarettes bring nicotine, tar, carbon monoxide, and other irritants into your body. Your lungs, heart, and blood vessels will be able to work better without these poisons. There are many different ways to quit smoking. Nicotine gum, nicotine patches, a nicotine inhaler, or nicotine nasal spray can help with physical craving. Hypnosis, support groups,  and medicines help break the habit of smoking. WHAT THINGS CAN I DO TO MAKE QUITTING EASIER?  Here are some tips to help you quit for good:  Pick a date when you will quit smoking completely. Tell all of your friends and family about your plan to quit on that date.  Do not try to slowly cut down on the number of cigarettes you are smoking. Pick a quit date and quit smoking completely starting on that day.  Throw away all cigarettes.   Clean and remove all ashtrays from your home, work, and car.  On a card, write down your reasons for quitting. Carry the card with you and read it when you get the urge to smoke.  Cleanse your body of nicotine. Drink enough water and fluids to keep your urine clear or pale yellow. Do this after quitting to flush the nicotine from your body.  Learn to predict your moods. Do not let a bad situation be your excuse to have a cigarette. Some situations in your life might tempt you into wanting a cigarette.  Never have "just one" cigarette. It leads to wanting another and another. Remind yourself of your decision to quit.  Change habits associated with smoking. If you smoked while driving or when feeling stressed, try other activities to replace smoking. Stand up when drinking your coffee. Brush your teeth after eating. Sit in a different chair when you read the paper. Avoid alcohol while trying to quit, and try to drink fewer caffeinated beverages. Alcohol and caffeine may urge you to smoke.  Avoid foods and drinks that can trigger a desire to smoke, such as sugary or spicy foods and alcohol.  Ask people who smoke not to smoke around you.  Have something planned to do right after eating or having a cup of coffee. For example, plan to take a walk or exercise.  Try a relaxation exercise to calm you down and decrease your stress. Remember, you may be tense and nervous for the first 2 weeks after you quit, but this will pass.  Find new activities to keep your  hands busy. Play with a pen, coin, or rubber band. Doodle or draw things on paper.  Brush your teeth right after eating. This will help cut down on the craving for the taste of tobacco after meals. You can also try mouthwash.   Use oral substitutes in place of cigarettes. Try using lemon drops, carrots, cinnamon sticks, or chewing gum. Keep them handy so they are available when you have the urge to smoke.  When you have the urge to smoke, try deep breathing.  Designate your home as a nonsmoking area.  If you are a heavy smoker, ask your health care provider about a   prescription for nicotine chewing gum. It can ease your withdrawal from nicotine.  Reward yourself. Set aside the cigarette money you save and buy yourself something nice.  Look for support from others. Join a support group or smoking cessation program. Ask someone at home or at work to help you with your plan to quit smoking.  Always ask yourself, "Do I need this cigarette or is this just a reflex?" Tell yourself, "Today, I choose not to smoke," or "I do not want to smoke." You are reminding yourself of your decision to quit.  Do not replace cigarette smoking with electronic cigarettes (commonly called e-cigarettes). The safety of e-cigarettes is unknown, and some may contain harmful chemicals.  If you relapse, do not give up! Plan ahead and think about what you will do the next time you get the urge to smoke. HOW WILL I FEEL WHEN I QUIT SMOKING? You may have symptoms of withdrawal because your body is used to nicotine (the addictive substance in cigarettes). You may crave cigarettes, be irritable, feel very hungry, cough often, get headaches, or have difficulty concentrating. The withdrawal symptoms are only temporary. They are strongest when you first quit but will go away within 10-14 days. When withdrawal symptoms occur, stay in control. Think about your reasons for quitting. Remind yourself that these are signs that your body  is healing and getting used to being without cigarettes. Remember that withdrawal symptoms are easier to treat than the major diseases that smoking can cause.  Even after the withdrawal is over, expect periodic urges to smoke. However, these cravings are generally short lived and will go away whether you smoke or not. Do not smoke! WHAT RESOURCES ARE AVAILABLE TO HELP ME QUIT SMOKING? Your health care provider can direct you to community resources or hospitals for support, which may include:  Group support.  Education.  Hypnosis.  Therapy. Document Released: 09/19/2003 Document Revised: 05/07/2013 Document Reviewed: 06/08/2012 Exit[employer] Patient Information 2015 [employer]. This information is not intended to replace advice given to you by your health care provider. Make sure you discuss any questions you have with your health care provider.   

## 2013-12-13 ENCOUNTER — Encounter (HOSPITAL_COMMUNITY): Payer: Self-pay | Admitting: Surgery

## 2014-01-25 IMAGING — CR DG CHEST 2V
2 series · 2 of 2 positions shown · non-contrast
Comparison: 03/11/2005

CLINICAL DATA: Preop chest radiograph. History of hypertension.
Smoker.

EXAM:
CHEST  2 VIEW

[w chest pa]
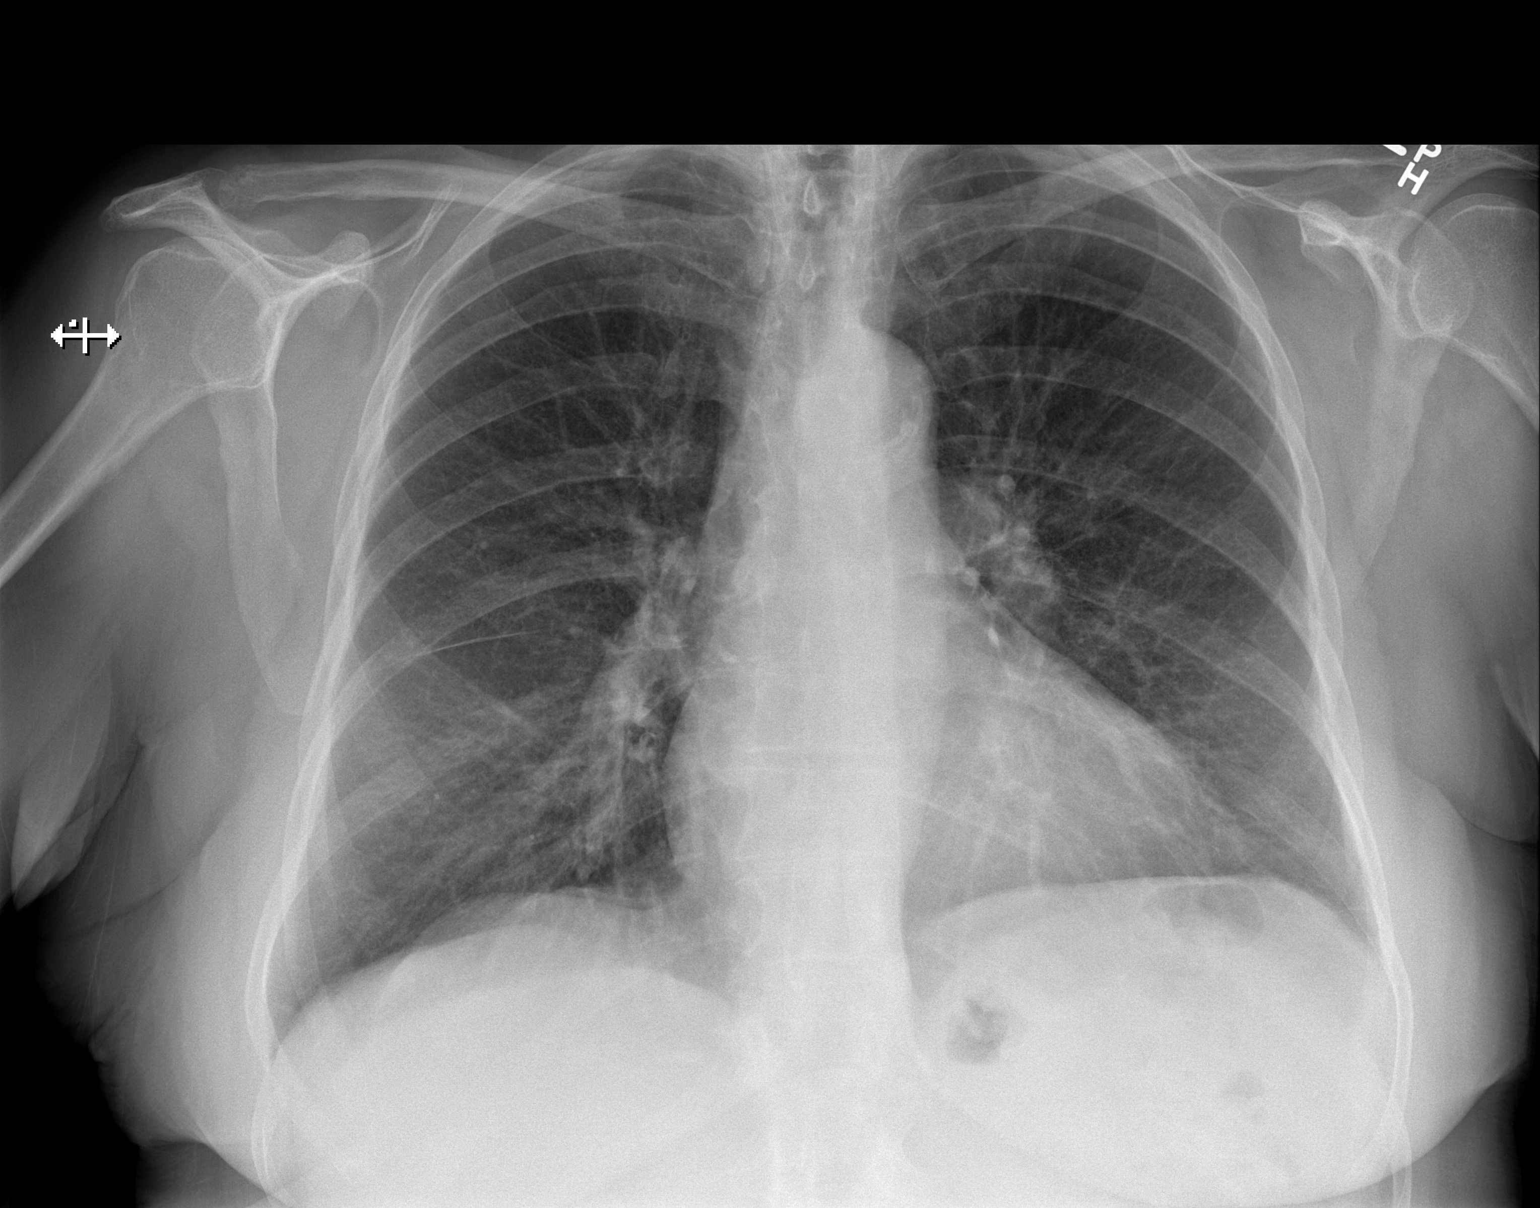

[w chest lat]
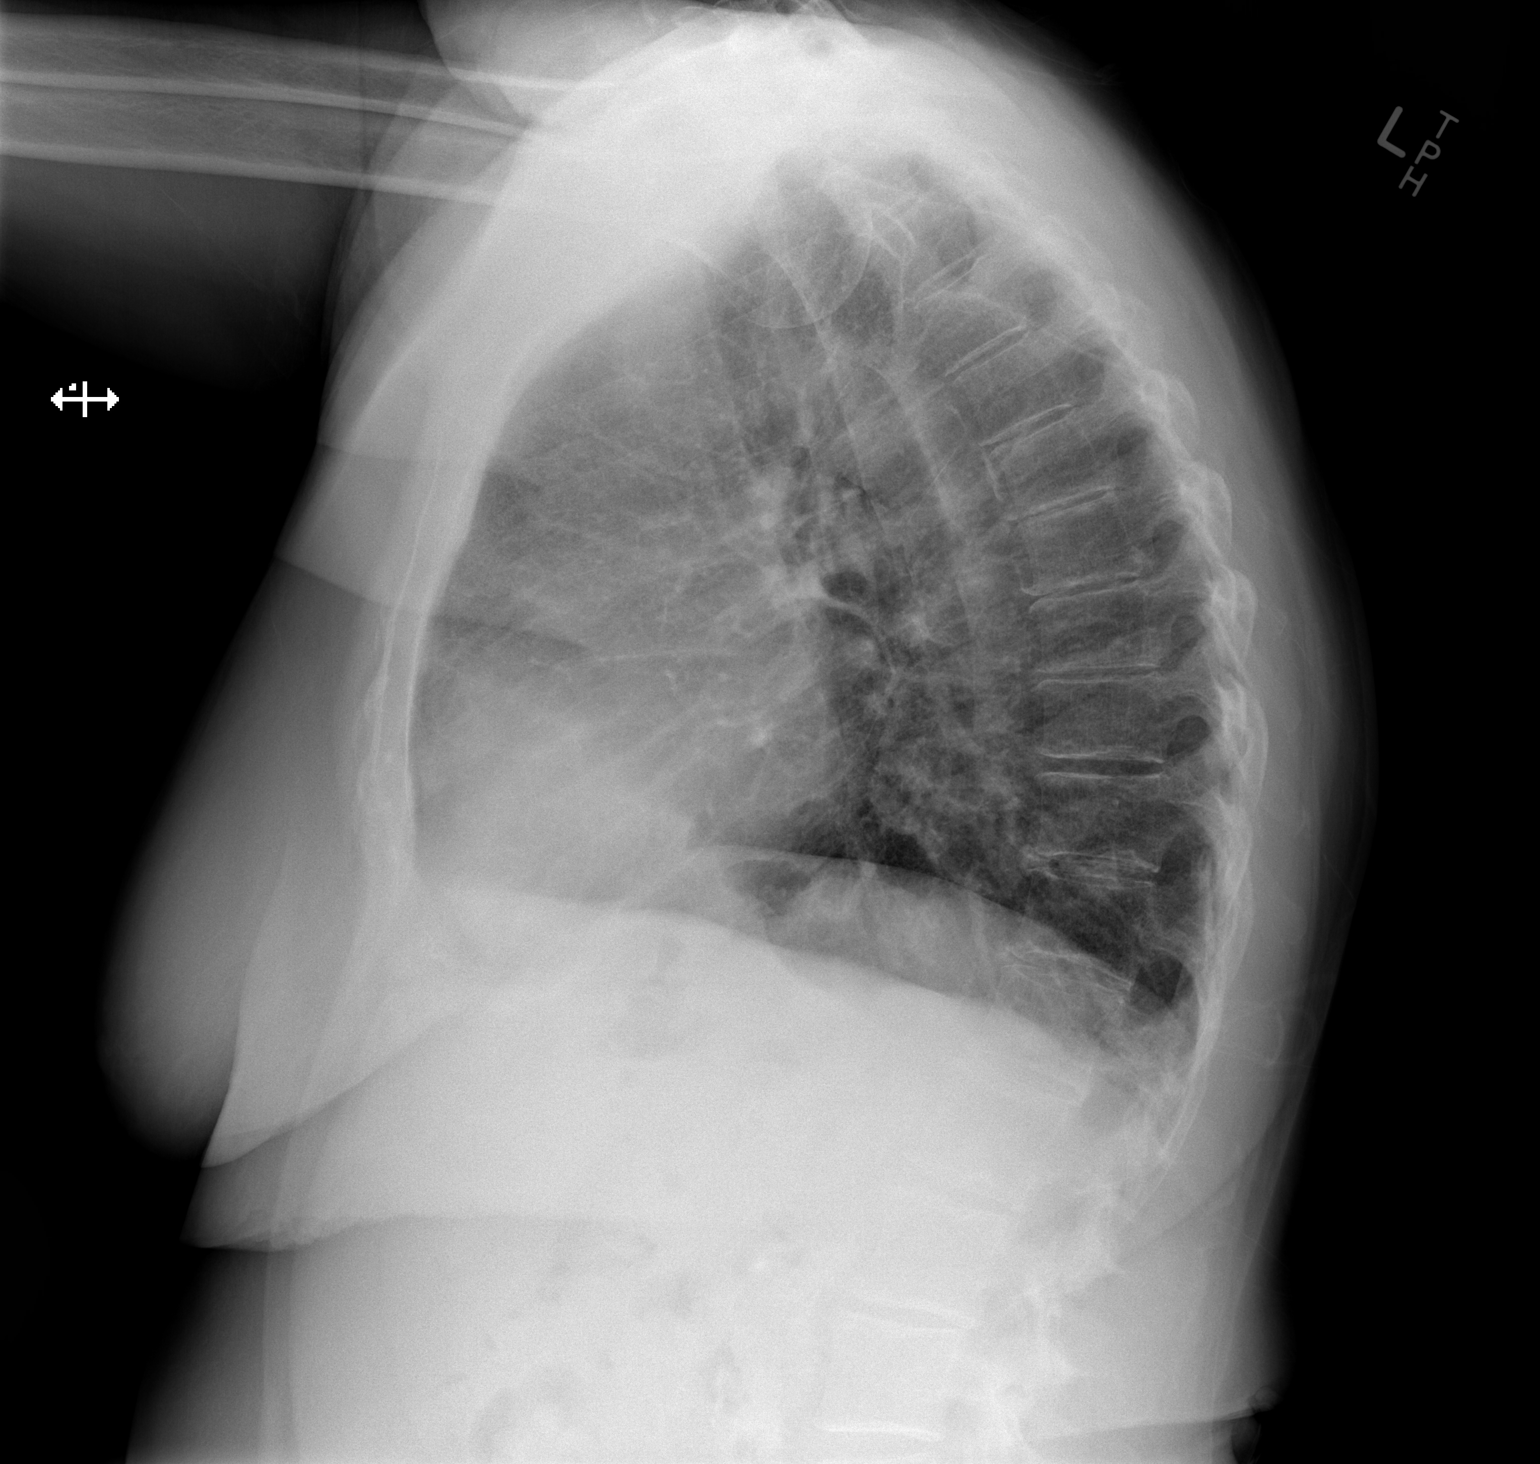

[2 of 2 positions shown; findings below may reference images not displayed]

FINDINGS: The heart size and mediastinal contours are within normal limits.
Both lungs are clear. Mild hyperinflation. The visualized skeletal
structures are unremarkable.
IMPRESSION: Lungs are mildly hyperinflated. The lungs are clear

## 2014-01-27 IMAGING — DX DG ANG/EXT/UNI/OR LEFT
1 series · 1 of 1 positions shown · non-contrast
Comparison: none

CLINICAL DATA: Peripheral vascular disease and left femoral to
popliteal bypass grafting.

EXAM:
ERUM HR/EXT/UNI/ OR
TECHNIQUE: Intraoperative image was obtained during the surgical procedure.
FLUOROSCOPY TIME:  dictate in minutes & seconds

[ap]
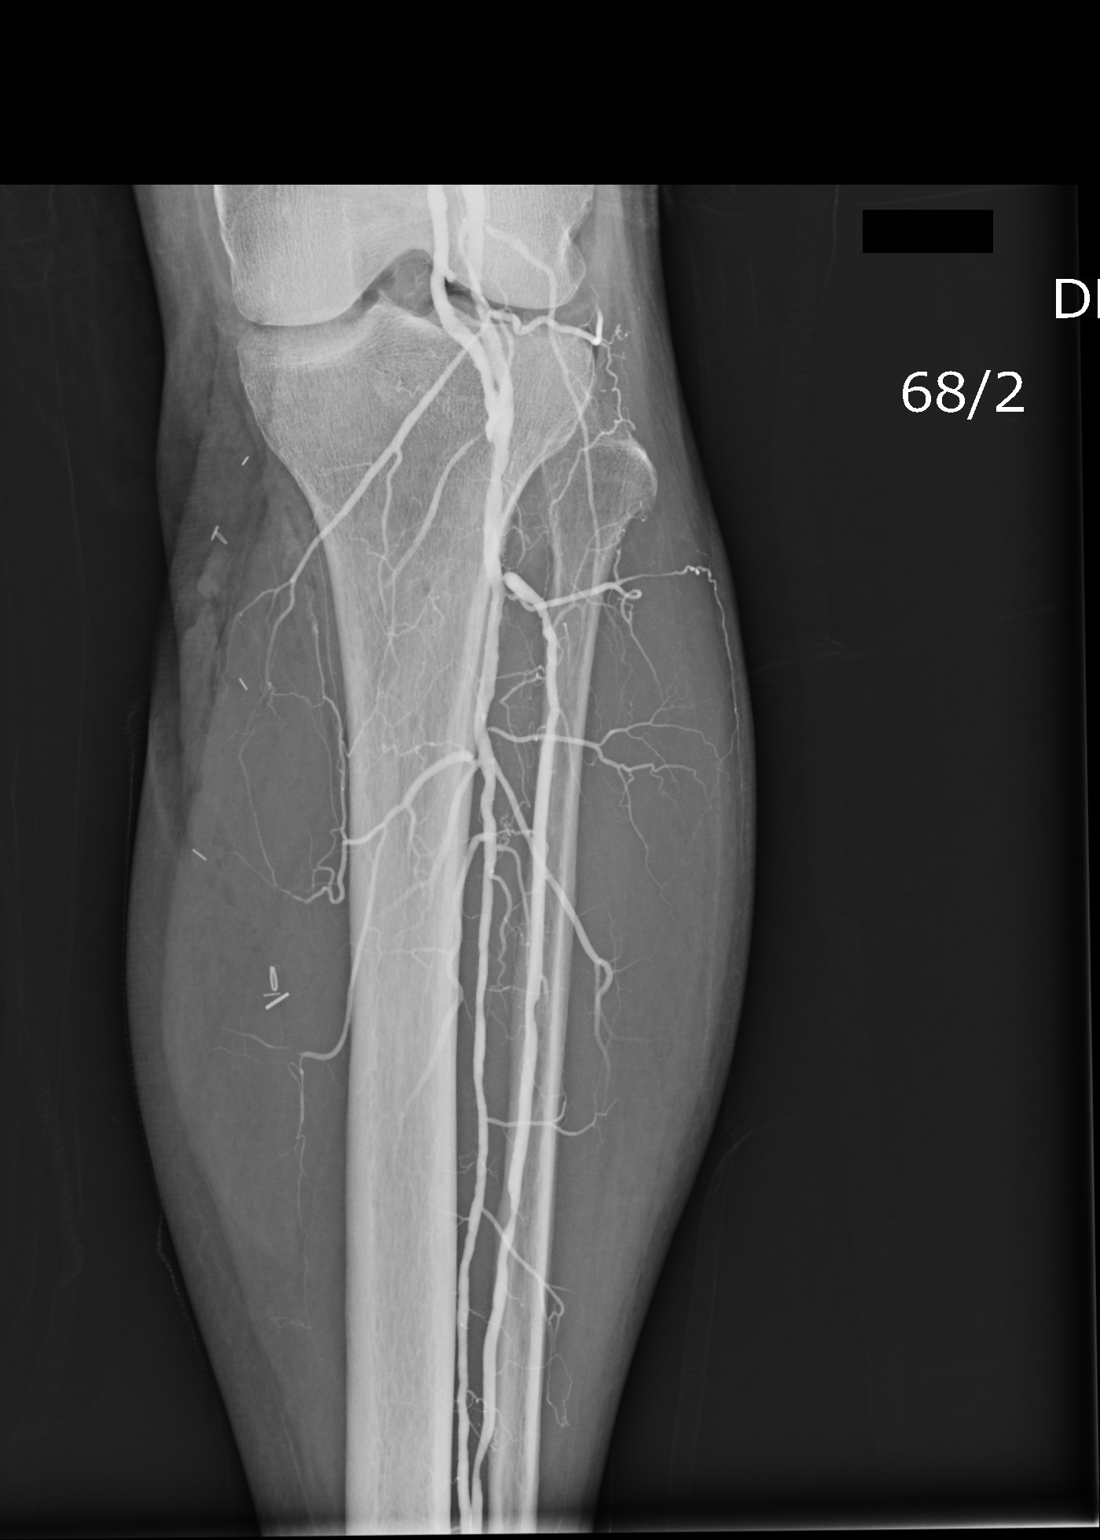

[1 of 1 positions shown; findings below may reference images not displayed]

FINDINGS: Opacified graft is noted with a patent anastomosis to the popliteal
artery below the knee. The anterior tibial and peroneal arteries are
well opacified and show evidence the posterior tibial artery is
likely chronically occluded are identified.
IMPRESSION: Open distal anastomosis of a femoral to below-knee popliteal graft.
Anterior tibial and peroneal runoff is visualized.

## 2014-03-25 ENCOUNTER — Encounter: Payer: Self-pay | Admitting: Vascular Surgery

## 2014-03-26 ENCOUNTER — Ambulatory Visit (INDEPENDENT_AMBULATORY_CARE_PROVIDER_SITE_OTHER): Payer: Medicare Other | Admitting: Vascular Surgery

## 2014-03-26 ENCOUNTER — Ambulatory Visit (INDEPENDENT_AMBULATORY_CARE_PROVIDER_SITE_OTHER)
Admission: RE | Admit: 2014-03-26 | Discharge: 2014-03-26 | Disposition: A | Discharge status: Home / Self Care | Payer: Medicare Other | Source: Ambulatory Visit | Attending: Vascular Surgery | Admitting: Vascular Surgery

## 2014-03-26 ENCOUNTER — Ambulatory Visit (HOSPITAL_COMMUNITY)
Admission: RE | Admit: 2014-03-26 | Discharge: 2014-03-26 | Disposition: A | Discharge status: Home / Self Care | Payer: Medicare Other | Source: Ambulatory Visit | Attending: Vascular Surgery | Admitting: Vascular Surgery

## 2014-03-26 ENCOUNTER — Ambulatory Visit: Payer: Medicare Other | Admitting: Family

## 2014-03-26 ENCOUNTER — Other Ambulatory Visit (HOSPITAL_COMMUNITY): Payer: Medicare Other

## 2014-03-26 ENCOUNTER — Other Ambulatory Visit: Payer: Self-pay | Admitting: *Deleted

## 2014-03-26 ENCOUNTER — Encounter: Payer: Self-pay | Admitting: Vascular Surgery

## 2014-03-26 VITALS — BP 139/82 | HR 67 | Resp 16 | Ht 65.0 in | Wt 160.0 lb

## 2014-03-26 DIAGNOSIS — I6523 Occlusion and stenosis of bilateral carotid arteries: Secondary | ICD-10-CM | POA: Diagnosis present

## 2014-03-26 DIAGNOSIS — I6529 Occlusion and stenosis of unspecified carotid artery: Secondary | ICD-10-CM | POA: Insufficient documentation

## 2014-03-26 DIAGNOSIS — Z48812 Encounter for surgical aftercare following surgery on the circulatory system: Secondary | ICD-10-CM | POA: Diagnosis not present

## 2014-03-26 DIAGNOSIS — E785 Hyperlipidemia, unspecified: Secondary | ICD-10-CM | POA: Diagnosis not present

## 2014-03-26 DIAGNOSIS — I739 Peripheral vascular disease, unspecified: Secondary | ICD-10-CM

## 2014-03-26 DIAGNOSIS — I10 Essential (primary) hypertension: Secondary | ICD-10-CM | POA: Diagnosis not present

## 2014-03-26 DIAGNOSIS — F17200 Nicotine dependence, unspecified, uncomplicated: Secondary | ICD-10-CM | POA: Insufficient documentation

## 2014-03-26 DIAGNOSIS — E119 Type 2 diabetes mellitus without complications: Secondary | ICD-10-CM | POA: Insufficient documentation

## 2014-03-26 NOTE — Progress Notes (Signed)
Subjective:     Patient ID: [name], female   DOB: 1940-06-17, 74 y.o.   MRN: [id number]  HPI this 74 year old female returns for continued follow-up regarding her left femoral-popliteal bypass grafting carotid occlusive disease. Denies any neurologic symptoms including lateralizing weakness, aphasia, amaurosis fugax, diplopia, blurred vision, and syncope. She is able to ambulate several blocks without claudication symptoms in the calf. She denies pain or numbness in either foot. She is taking aspirin and Plavix.  Past Medical History  Diagnosis Date  . Hyperlipidemia   . 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 Hypertension   . Carotid artery occlusion   . Stroke 2008  . Diabetes mellitus     borderline , no longer treated /w medicine     History  Substance Use Topics  . Smoking status: Current Some Day Smoker -- 0.25 packs/day for 50 years    Types: Cigarettes  . Smokeless tobacco: Never Used     Comment: pt states she smokes about 4-5 cigs per day and is trying to quit  . Alcohol Use: No    Family History  Problem Relation Age of Onset  . Coronary artery disease Mother   . Heart disease Mother   . Heart attack Mother   . Coronary artery disease Sister   . Heart disease Sister   . Heart disease Father   . Other Father     amputation    No Known Allergies   Current outpatient prescriptions:  .  aspirin 81 MG tablet, Take 81 mg by mouth daily., Disp: , Rfl:  .  clopidogrel (PLAVIX) 75 MG tablet, Take 75 mg by mouth daily., Disp: , Rfl:  .  ibuprofen (ADVIL) 200 MG tablet, Take 400 mg by mouth daily as needed for pain., Disp: , Rfl:  .  losartan (COZAAR) 50 MG tablet, Take 50 mg by mouth Daily., Disp: , Rfl:  .  metFORMIN (GLUCOPHAGE) 500 MG tablet, Take by mouth 2 (two) times daily with a meal., Disp: , Rfl:  .  simvastatin (ZOCOR) 80 MG tablet, Take 80 mg by mouth at bedtime., Disp: ,  Rfl:   Filed Vitals:   03/26/14 1505  BP: 139/82  Pulse: 67  Resp: 16  Height:  (1.651 m)  Weight: 160 lb (72.576 kg)    Body mass index is 26.63 kg/(m^2).           Review of Systems as chest pain, dyspnea on exertion, PND, orthopnea, hemoptysis.     Objective:   Physical Exam BP 139/82 mmHg  Pulse 67  Resp 16  Ht  (1.651 m)  Wt 160 lb (72.576 kg)  BMI 26.63 kg/m2  Gen.-alert and oriented x3 in no apparent distress HEENT normal for age Lungs no rhonchi or wheezing Cardiovascular regular rhythm no murmurs carotid pulses 3+ palpable no bruits audible Abdomen soft nontender no palpable masses Musculoskeletal free of  major deformities Skin clear -no rashes Neurologic normal Lower extremities 3+ femoral and 2+ dorsalis pedis pulses palpable bilaterally with no edema  Today I ordered a carotid duplex exam which I reviewed and interpreted. There is minimal right ICA stenosis proximally 40% and there is a 50-60% left ICA stenosis which is unchanged from previous study one year ago. Also ordered a duplex scan of the left femoral-popliteal bypass graft. This was performed in October 2014. ABIs 0.98. There is excellent flow throughout the graft  with minimal elevated velocity at the distal anastomosis.       Assessment:     Widely patent left femoral-popliteal bypass and mild to moderate left ICA stenosis-asymptomatic and stable    Plan:     Return in 6 months for duplex scan of left lower extremity bypass and return in one year for carotid duplex exam and repeat left femoral-popliteal scan to see nurse practitioner

## 2014-09-25 ENCOUNTER — Encounter: Payer: Self-pay | Admitting: Family

## 2014-09-30 ENCOUNTER — Ambulatory Visit (INDEPENDENT_AMBULATORY_CARE_PROVIDER_SITE_OTHER): Payer: Medicare Other | Admitting: Family

## 2014-09-30 ENCOUNTER — Ambulatory Visit (INDEPENDENT_AMBULATORY_CARE_PROVIDER_SITE_OTHER)
Admission: RE | Admit: 2014-09-30 | Discharge: 2014-09-30 | Disposition: A | Discharge status: Home / Self Care | Payer: Medicare Other | Source: Ambulatory Visit | Attending: Vascular Surgery | Admitting: Vascular Surgery

## 2014-09-30 ENCOUNTER — Other Ambulatory Visit: Payer: Self-pay | Admitting: Vascular Surgery

## 2014-09-30 ENCOUNTER — Ambulatory Visit (HOSPITAL_COMMUNITY)
Admission: RE | Admit: 2014-09-30 | Discharge: 2014-09-30 | Disposition: A | Discharge status: Home / Self Care | Payer: Medicare Other | Source: Ambulatory Visit | Attending: Family | Admitting: Family

## 2014-09-30 ENCOUNTER — Encounter: Payer: Self-pay | Admitting: Family

## 2014-09-30 VITALS — BP 125/73 | HR 75 | Temp 97.9°F | Resp 16 | Ht 64.0 in | Wt 157.0 lb

## 2014-09-30 DIAGNOSIS — Z95828 Presence of other vascular implants and grafts: Secondary | ICD-10-CM | POA: Insufficient documentation

## 2014-09-30 DIAGNOSIS — Z48812 Encounter for surgical aftercare following surgery on the circulatory system: Secondary | ICD-10-CM

## 2014-09-30 DIAGNOSIS — I6523 Occlusion and stenosis of bilateral carotid arteries: Secondary | ICD-10-CM | POA: Diagnosis not present

## 2014-09-30 DIAGNOSIS — Z9889 Other specified postprocedural states: Secondary | ICD-10-CM | POA: Diagnosis not present

## 2014-09-30 DIAGNOSIS — E785 Hyperlipidemia, unspecified: Secondary | ICD-10-CM | POA: Insufficient documentation

## 2014-09-30 DIAGNOSIS — I10 Essential (primary) hypertension: Secondary | ICD-10-CM | POA: Insufficient documentation

## 2014-09-30 DIAGNOSIS — I739 Peripheral vascular disease, unspecified: Secondary | ICD-10-CM | POA: Diagnosis not present

## 2014-09-30 DIAGNOSIS — Z720 Tobacco use: Secondary | ICD-10-CM

## 2014-09-30 DIAGNOSIS — E119 Type 2 diabetes mellitus without complications: Secondary | ICD-10-CM | POA: Insufficient documentation

## 2014-09-30 DIAGNOSIS — F17200 Nicotine dependence, unspecified, uncomplicated: Secondary | ICD-10-CM

## 2014-09-30 NOTE — Progress Notes (Signed)
[facility] [place] HISTORY AND PHYSICAL -PAD  History of Present Illness [name] is a 74 y.o. female patient of Dr. Hart Rochester who is s/p left femoropopliteal artery bypass graft using saphenous vein on 10/12/2012, left superficial femoral artery stent 03/11/2005, left popliteal artery stent 04/30/2009, and right superficial femoral artery stent on 10/17/2009.  She is also being monitored for carotid artery disease. Dr. Hart Rochester last saw pt on 03/26/14. At that time his assessment was that there was excellent flow throughout the left leg graft with minimal elevated velocity at the distal anastomosis.  She returns today for follow up. Her claudication symptoms are completely relieved. She does have persistent edema from the ankle to the knee. She does elevate her legs at night and intermittently during the day. She thinks it is slowly improving. She has no other complaints. She denies claudication symptoms with walking, denies non healing wounds.  She had a TIA in 2008 as manifested by weakness in right hand which has resolved, no further TIA or stroke activity.  The patient reports New Medical or Surgical History: none.  Pt Diabetic: yes, states in good control Pt smoker: smoker (1/2 ppd, started in her 20's)  Pt meds include: Statin :Yes ASA: Yes Other anticoagulants/antiplatelets: Plavix     Past Medical History  Diagnosis Date  . Hyperlipidemia   . 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 Hypertension   . Carotid artery occlusion   . Stroke 2008  . Diabetes mellitus     borderline , no longer treated /w medicine     Social History Social History  Substance Use Topics  . Smoking status: Current Some Day Smoker -- 0.25 packs/day for 50 years    Types: Cigarettes  . Smokeless tobacco: Never Used     Comment: pt states she smokes about 4-5 cigs per day and is trying to quit   . Alcohol Use: No    Family History Family History  Problem Relation Age of Onset  . Coronary artery disease Mother   . Heart disease Mother   . Heart attack Mother   . Coronary artery disease Sister   . Heart disease Sister   . Heart disease Father   . Other Father     amputation    Past Surgical History  Procedure Laterality Date  . Tubal ligation    . Right sfa stent   2010  . Left popliteal stent   04-30-2009  . Inguinal hernia repair      right  . Sfa stent  04-2005    LEFT  . Femoral-popliteal bypass graft Left 10/12/2012    Procedure: BYPASS GRAFT FEMORAL-POPLITEAL ARTERY;  Surgeon: Pryor Ochoa, MD;  Location: [facility] OR;  Service: Vascular;  Laterality: Left;  . Abdominal aortagram N/A 10/03/2012    Procedure: ABDOMINAL Ronny Flurry;  Surgeon: Nada Libman, MD;  Location: [facility] CATH LAB;  Service: Cardiovascular;  Laterality: N/A;  . Lower extremity angiogram Bilateral 10/03/2012    Procedure: LOWER EXTREMITY ANGIOGRAM;  Surgeon: Nada Libman, MD;  Location: [facility] CATH LAB;  Service: Cardiovascular;  Laterality: Bilateral;    No Known Allergies  Current Outpatient Prescriptions  Medication Sig Dispense Refill  . aspirin 81 MG tablet Take 81 mg by mouth daily.    . clopidogrel (PLAVIX) 75 MG tablet Take 75 mg by mouth daily.    Marland Kitchen ibuprofen (ADVIL) 200 MG tablet Take  400 mg by mouth daily as needed for pain.    Marland Kitchen losartan (COZAAR) 50 MG tablet Take 50 mg by mouth Daily.    . metFORMIN (GLUCOPHAGE) 500 MG tablet Take by mouth 2 (two) times daily with a meal.    . simvastatin (ZOCOR) 80 MG tablet Take 80 mg by mouth at bedtime.     No current facility-administered medications for this visit.    ROS: See HPI for pertinent positives and negatives.   Physical Examination  Filed Vitals:   09/30/14 1521  BP: 125/73  Pulse: 75  Temp: 97.9 F (36.6 C)  TempSrc: Oral  Resp: 16  Height:  (1.626 m)  Weight: 157 lb (71.215 kg)  SpO2: 99%   Body mass index is  26.94 kg/(m^2).    General: A&O x 3, WDWN. Gait: normal Eyes: PERRLA. Pulmonary: CTAB, without wheezes , rales or rhonchi. Cardiac: regular Rythm , without detected murmur.     Carotid Bruits Right Left   Negative Negative  Aorta is not palpable. Radial pulses: are 2+ palpable and =   VASCULAR EXAM: Extremities without ischemic changes  without Gangrene; without open wounds. No peripheral edema.      LE Pulses Right Left   FEMORAL  palpable  palpable    POPLITEAL not palpable  not palpable   POSTERIOR TIBIAL not palpable  not palpable    DORSALIS PEDIS  ANTERIOR TIBIAL faintly palpable  faintly palpable    Abdomen: soft, NT, no palpable masses. Skin: no rashes, no ulcers. Musculoskeletal: no muscle wasting or atrophy. Neurologic: A&O X 3; Appropriate Affect; MOTOR FUNCTION: moving all extremities equally, motor strength 5/5 throughout. Speech is fluent/normal. CN 2-12 intact.          Non-Invasive Vascular Imaging: DATE: 09/30/2014 LOWER EXTREMITY ARTERIAL EVALUATION    INDICATION: Peripheral Vascular Disease    PREVIOUS INTERVENTION(S): Left femoral-popliteal bypass 10/12/2012 ; Left superficial femoral stent 03/11/2005; Left popliteal artery stent 04/30/2009; Right superficial femoral artery stent 10/17/2009    DUPLEX EXAM: Duplex evaluation of the lower extremity arterial system to include the common femoral, superficial femoral, popliteal, and tibial arteries and bypass graft(s) and/or stent(s) if present.      FINDINGS:                                       RIGHT LOWER EXTREMITY:  Heterogeneous plaque present throughout the right lower extremity arterial system. Small area of plaque noted within the proximal stent, as noted on diagram.  Dampened phasicity noted at the common femoral artery suggestive of a more proximal disease process.    LEFT LOWER EXTREMITY:  Dampened phasicity noted at the common femoral artery suggestive of a more proximal disease process.  Heterogeneous plaque present involving the native arteries.        See the attached diagram for velocities.  IMPRESSION:  Dampened phasicity suggestive of hemodynamically significant disease process more proximal than could be evaluated. Elevated velocity present involving the proximal segment of the right mid superficial femoral artery stent, suggestive of stenosis in the 1%-49% range. Patent left femoral to popliteal artery bypass graft, no hemodynamically significant stenosis evident at the distal anastomosis. No color or spectral Doppler waveform could be obtained involving the left posterior tibial artery suggestive of vessel occlusion. Minimal decrease in the bilateral ankle brachial indices since previous study on 03/26/2014.    ANKLE/BRACHIAL INDEX - Right = 0.88  Left =   0.85         (Please see complete report)                  ASSESSMENT: [name] is a 74 y.o. female who is s/p left femoropopliteal artery bypass graft using saphenous vein on 10/12/2012, left superficial femoral artery stent 03/11/2005, left popliteal artery stent 04/30/2009, and right superficial femoral artery stent on 10/17/2009.  She is also being monitored for carotid artery disease.  Dr. Hart Rochester last saw pt on 03/26/14. At that time his assessment was that there was excellent flow throughout the left leg graft with minimal elevated velocity at the distal anastomosis. Today's bilateral LE arterial duplex suggests hemodynamically significant disease process more proximal than could be evaluated. Elevated velocity present involving the proximal segment of the right mid superficial femoral artery stent, suggestive of stenosis in the 1%-49%  range. Patent left femoral to popliteal artery bypass graft, no hemodynamically significant stenosis evident at the distal anastomosis. No color or spectral Doppler waveform could be obtained involving the left posterior tibial artery suggestive of vessel occlusion. Minimal decrease in the bilateral ankle brachial indices since previous study on 03/26/14.  Her DM seems to be in good control. Her primary atherosclerotic risk factor remains her smoking, see Plan.  She denies claudication symptoms with walking, denies non healing wounds. She had a TIA in 2008 as manifested by weakness in right hand which has resolved, no further TIA or stroke activity.   PLAN:  The patient was counseled re smoking cessation and given several free resources re smoking cessation.  Based on the patient's vascular studies and examination, pt will return to clinic in 6 months with ABI's, left LE arterial duplex, and carotid duplex; pt should already be scheduled for carotid duplex in 6 months.  I discussed in depth with the patient the nature of atherosclerosis, and emphasized the importance of maximal medical management including strict control of blood pressure, blood glucose, and lipid levels, obtaining regular exercise, and cessation of smoking.  The patient is aware that without maximal medical management the underlying atherosclerotic disease process will progress, limiting the benefit of any interventions.  The patient was given information about PAD including signs, symptoms, treatment, what symptoms should prompt the patient to seek immediate medical care, and risk reduction measures to take.  Charisse March, RN, MSN, FNP-C [facility] Phone: [phone]  Clinic MD: Myra Gianotti  09/30/2014 3:00 PM

## 2014-09-30 NOTE — Patient Instructions (Signed)
Peripheral Vascular Disease Peripheral Vascular Disease (PVD), also called Peripheral Arterial Disease (PAD), is a circulation problem caused by cholesterol (atherosclerotic plaque) deposits in the arteries. PVD commonly occurs in the lower extremities (legs) but it can occur in other areas of the body, such as your arms. The cholesterol buildup in the arteries reduces blood flow which can cause pain and other serious problems. The presence of PVD can place a person at risk for Coronary Artery Disease (CAD).  CAUSES  Causes of PVD can be many. It is usually associated with more than one risk factor such as:   High Cholesterol.  Smoking.  Diabetes.  Lack of exercise or inactivity.  High blood pressure (hypertension).  Obesity.  Family history. SYMPTOMS   When the lower extremities are affected, patients with PVD may experience:  Leg pain with exertion or physical activity. This is called INTERMITTENT CLAUDICATION. This may present as cramping or numbness with physical activity. The location of the pain is associated with the level of blockage. For example, blockage at the abdominal level (distal abdominal aorta) may result in buttock or hip pain. Lower leg arterial blockage may result in calf pain.  As PVD becomes more severe, pain can develop with less physical activity.  In people with severe PVD, leg pain may occur at rest.  Other PVD signs and symptoms:  Leg numbness or weakness.  Coldness in the affected leg or foot, especially when compared to the other leg.  A change in leg color.  Patients with significant PVD are more prone to ulcers or sores on toes, feet or legs. These may take longer to heal or may reoccur. The ulcers or sores can become infected.  If signs and symptoms of PVD are ignored, gangrene may occur. This can result in the loss of toes or loss of an entire limb.  Not all leg pain is related to PVD. Other medical conditions can cause leg pain such  as:  Blood clots (embolism) or Deep Vein Thrombosis.  Inflammation of the blood vessels (vasculitis).  Spinal stenosis. DIAGNOSIS  Diagnosis of PVD can involve several different types of tests. These can include:  Pulse Volume Recording Method (PVR). This test is simple, painless and does not involve the use of X-rays. PVR involves measuring and comparing the blood pressure in the arms and legs. An ABI (Ankle-Brachial Index) is calculated. The normal ratio of blood pressures is 1. As this number becomes smaller, it indicates more severe disease.  < 0.95 - indicates significant narrowing in one or more leg vessels.  <0.8 - there will usually be pain in the foot, leg or buttock with exercise.  <0.4 - will usually have pain in the legs at rest.  <0.25 - usually indicates limb threatening PVD.  Doppler detection of pulses in the legs. This test is painless and checks to see if you have a pulses in your legs/feet.  A dye or contrast material (a substance that highlights the blood vessels so they show up on x-ray) may be given to help your caregiver better see the arteries for the following tests. The dye is eliminated from your body by the kidney's. Your caregiver may order blood work to check your kidney function and other laboratory values before the following tests are performed:  Magnetic Resonance Angiography (MRA). An MRA is a picture study of the blood vessels and arteries. The MRA machine uses a large magnet to produce images of the blood vessels.  Computed Tomography Angiography (CTA). A CTA   is a specialized x-ray that looks at how the blood flows in your blood vessels. An IV may be inserted into your arm so contrast dye can be injected.  Angiogram. Is a procedure that uses x-rays to look at your blood vessels. This procedure is minimally invasive, meaning a small incision (cut) is made in your groin. A small tube (catheter) is then inserted into the artery of your groin. The catheter  is guided to the blood vessel or artery your caregiver wants to examine. Contrast dye is injected into the catheter. X-rays are then taken of the blood vessel or artery. After the images are obtained, the catheter is taken out. TREATMENT  Treatment of PVD involves many interventions which may include:  Lifestyle changes:  Quitting smoking.  Exercise.  Following a low fat, low cholesterol diet.  Control of diabetes.  Foot care is very important to the PVD patient. Good foot care can help prevent infection.  Medication:  Cholesterol-lowering medicine.  Blood pressure medicine.  Anti-platelet drugs.  Certain medicines may reduce symptoms of Intermittent Claudication.  Interventional/Surgical options:  Angioplasty. An Angioplasty is a procedure that inflates a balloon in the blocked artery. This opens the blocked artery to improve blood flow.  Stent Implant. A wire mesh tube (stent) is placed in the artery. The stent expands and stays in place, allowing the artery to remain open.  Peripheral Bypass Surgery. This is a surgical procedure that reroutes the blood around a blocked artery to help improve blood flow. This type of procedure may be performed if Angioplasty or stent implants are not an option. SEEK IMMEDIATE MEDICAL CARE IF:   You develop pain or numbness in your arms or legs.  Your arm or leg turns cold, becomes blue in color.  You develop redness, warmth, swelling and pain in your arms or legs. MAKE SURE YOU:   Understand these instructions.  Will watch your condition.  Will get help right away if you are not doing well or get worse. Document Released: 01/29/2004 Document Revised: 03/15/2011 Document Reviewed: 12/26/2007 Exit[employer] Patient Information 2015 [employer]. This information is not intended to replace advice given to you by your health care provider. Make sure you discuss any questions you have with your health care provider.    Stroke  Prevention Some medical conditions and behaviors are associated with an increased chance of having a stroke. You may prevent a stroke by making healthy choices and managing medical conditions. HOW CAN I REDUCE MY RISK OF HAVING A STROKE?   Stay physically active. Get at least 30 minutes of activity on most or all days.  Do not smoke. It may also be helpful to avoid exposure to secondhand smoke.  Limit alcohol use. Moderate alcohol use is considered to be:  No more than 2 drinks per day for men.  No more than 1 drink per day for nonpregnant women.  Eat healthy foods. This involves:  Eating 5 or more servings of fruits and vegetables a day.  Making dietary changes that address high blood pressure (hypertension), high cholesterol, diabetes, or obesity.  Manage your cholesterol levels.  Making food choices that are high in fiber and low in saturated fat, trans fat, and cholesterol may control cholesterol levels.  Take any prescribed medicines to control cholesterol as directed by your health care provider.  Manage your diabetes.  Controlling your carbohydrate and sugar intake is recommended to manage diabetes.  Take any prescribed medicines to control diabetes as directed by your health care   provider.  Control your hypertension.  Making food choices that are low in salt (sodium), saturated fat, trans fat, and cholesterol is recommended to manage hypertension.  Take any prescribed medicines to control hypertension as directed by your health care provider.  Maintain a healthy weight.  Reducing calorie intake and making food choices that are low in sodium, saturated fat, trans fat, and cholesterol are recommended to manage weight.  Stop drug abuse.  Avoid taking birth control pills.  Talk to your health care provider about the risks of taking birth control pills if you are over 35 years old, smoke, get migraines, or have ever had a blood clot.  Get evaluated for sleep  disorders (sleep apnea).  Talk to your health care provider about getting a sleep evaluation if you snore a lot or have excessive sleepiness.  Take medicines only as directed by your health care provider.  For some people, aspirin or blood thinners (anticoagulants) are helpful in reducing the risk of forming abnormal blood clots that can lead to stroke. If you have the irregular heart rhythm of atrial fibrillation, you should be on a blood thinner unless there is a good reason you cannot take them.  Understand all your medicine instructions.  Make sure that other conditions (such as anemia or atherosclerosis) are addressed. SEEK IMMEDIATE MEDICAL CARE IF:   You have sudden weakness or numbness of the face, arm, or leg, especially on one side of the body.  Your face or eyelid droops to one side.  You have sudden confusion.  You have trouble speaking (aphasia) or understanding.  You have sudden trouble seeing in one or both eyes.  You have sudden trouble walking.  You have dizziness.  You have a loss of balance or coordination.  You have a sudden, severe headache with no known cause.  You have new chest pain or an irregular heartbeat. Any of these symptoms may represent a serious problem that is an emergency. Do not wait to see if the symptoms will go away. Get medical help at once. Call your local emergency services (911 in [place] Do not drive yourself to the hospital. Document Released: 01/29/2004 Document Revised: 05/07/2013 Document Reviewed: 06/23/2012 [employer] Patient Information 2015 [employer]. This information is not intended to replace advice given to you by your health care provider. Make sure you discuss any questions you have with your health care provider.    Smoking Cessation Quitting smoking is important to your health and has many advantages. However, it is not always easy to quit since nicotine is a very addictive drug. Oftentimes, people try 3 times or  more before being able to quit. This document explains the best ways for you to prepare to quit smoking. Quitting takes hard work and a lot of effort, but you can do it. ADVANTAGES OF QUITTING SMOKING  You will live longer, feel better, and live better.  Your body will feel the impact of quitting smoking almost immediately.  Within 20 minutes, blood pressure decreases. Your pulse returns to its normal level.  After 8 hours, carbon monoxide levels in the blood return to normal. Your oxygen level increases.  After 24 hours, the chance of having a heart attack starts to decrease. Your breath, hair, and body stop smelling like smoke.  After 48 hours, damaged nerve endings begin to recover. Your sense of taste and smell improve.  After 72 hours, the body is virtually free of nicotine. Your bronchial tubes relax and breathing becomes easier.  After   2 to 12 weeks, lungs can hold more air. Exercise becomes easier and circulation improves.  The risk of having a heart attack, stroke, cancer, or lung disease is greatly reduced.  After 1 year, the risk of coronary heart disease is cut in half.  After 5 years, the risk of stroke falls to the same as a nonsmoker.  After 10 years, the risk of lung cancer is cut in half and the risk of other cancers decreases significantly.  After 15 years, the risk of coronary heart disease drops, usually to the level of a nonsmoker.  If you are pregnant, quitting smoking will improve your chances of having a healthy baby.  The people you live with, especially any children, will be healthier.  You will have extra money to spend on things other than cigarettes. QUESTIONS TO THINK ABOUT BEFORE ATTEMPTING TO QUIT You may want to talk about your answers with your health care provider.  Why do you want to quit?  If you tried to quit in the past, what helped and what did not?  What will be the most difficult situations for you after you quit? How will you plan to  handle them?  Who can help you through the tough times? Your family? Friends? A health care provider?  What pleasures do you get from smoking? What ways can you still get pleasure if you quit? Here are some questions to ask your health care provider:  How can you help me to be successful at quitting?  What medicine do you think would be best for me and how should I take it?  What should I do if I need more help?  What is smoking withdrawal like? How can I get information on withdrawal? GET READY  Set a quit date.  Change your environment by getting rid of all cigarettes, ashtrays, matches, and lighters in your home, car, or work. Do not let people smoke in your home.  Review your past attempts to quit. Think about what worked and what did not. GET SUPPORT AND ENCOURAGEMENT You have a better chance of being successful if you have help. You can get support in many ways.  Tell your family, friends, and coworkers that you are going to quit and need their support. Ask them not to smoke around you.  Get individual, group, or telephone counseling and support. Programs are available at local hospitals and health centers. Call your local health department for information about programs in your area.  Spiritual beliefs and practices may help some smokers quit.  Download a "quit meter" on your computer to keep track of quit statistics, such as how long you have gone without smoking, cigarettes not smoked, and money saved.  Get a self-help book about quitting smoking and staying off tobacco. LEARN NEW SKILLS AND BEHAVIORS  Distract yourself from urges to smoke. Talk to someone, go for a walk, or occupy your time with a task.  Change your normal routine. Take a different route to work. Drink tea instead of coffee. Eat breakfast in a different place.  Reduce your stress. Take a hot bath, exercise, or read a book.  Plan something enjoyable to do every day. Reward yourself for not  smoking.  Explore interactive web-based programs that specialize in helping you quit. GET MEDICINE AND USE IT CORRECTLY Medicines can help you stop smoking and decrease the urge to smoke. Combining medicine with the above behavioral methods and support can greatly increase your chances of successfully quitting smoking.    Nicotine replacement therapy helps deliver nicotine to your body without the negative effects and risks of smoking. Nicotine replacement therapy includes nicotine gum, lozenges, inhalers, nasal sprays, and skin patches. Some may be available over-the-counter and others require a prescription.  Antidepressant medicine helps people abstain from smoking, but how this works is unknown. This medicine is available by prescription.  Nicotinic receptor partial agonist medicine simulates the effect of nicotine in your brain. This medicine is available by prescription. Ask your health care provider for advice about which medicines to use and how to use them based on your health history. Your health care provider will tell you what side effects to look out for if you choose to be on a medicine or therapy. Carefully read the information on the package. Do not use any other product containing nicotine while using a nicotine replacement product.  RELAPSE OR DIFFICULT SITUATIONS Most relapses occur within the first 3 months after quitting. Do not be discouraged if you start smoking again. Remember, most people try several times before finally quitting. You may have symptoms of withdrawal because your body is used to nicotine. You may crave cigarettes, be irritable, feel very hungry, cough often, get headaches, or have difficulty concentrating. The withdrawal symptoms are only temporary. They are strongest when you first quit, but they will go away within 10-14 days. To reduce the chances of relapse, try to:  Avoid drinking alcohol. Drinking lowers your chances of successfully quitting.  Reduce the  amount of caffeine you consume. Once you quit smoking, the amount of caffeine in your body increases and can give you symptoms, such as a rapid heartbeat, sweating, and anxiety.  Avoid smokers because they can make you want to smoke.  Do not let weight gain distract you. Many smokers will gain weight when they quit, usually less than 10 pounds. Eat a healthy diet and stay active. You can always lose the weight gained after you quit.  Find ways to improve your mood other than smoking. FOR MORE INFORMATION  www.smokefree.gov  Document Released: 12/15/2000 Document Revised: 05/07/2013 Document Reviewed: 04/01/2011 [employer] Patient Information 2015 [employer]. This information is not intended to replace advice given to you by your health care provider. Make sure you discuss any questions you have with your health care provider.    Smoking Cessation, Tips for Success If you are ready to quit smoking, congratulations! You have chosen to help yourself be healthier. Cigarettes bring nicotine, tar, carbon monoxide, and other irritants into your body. Your lungs, heart, and blood vessels will be able to work better without these poisons. There are many different ways to quit smoking. Nicotine gum, nicotine patches, a nicotine inhaler, or nicotine nasal spray can help with physical craving. Hypnosis, support groups, and medicines help break the habit of smoking. WHAT THINGS CAN I DO TO MAKE QUITTING EASIER?  Here are some tips to help you quit for good:  Pick a date when you will quit smoking completely. Tell all of your friends and family about your plan to quit on that date.  Do not try to slowly cut down on the number of cigarettes you are smoking. Pick a quit date and quit smoking completely starting on that day.  Throw away all cigarettes.   Clean and remove all ashtrays from your home, work, and car.  On a card, write down your reasons for quitting. Carry the card with you and read it  when you get the urge to smoke.  Cleanse your body   of nicotine. Drink enough water and fluids to keep your urine clear or pale yellow. Do this after quitting to flush the nicotine from your body.  Learn to predict your moods. Do not let a bad situation be your excuse to have a cigarette. Some situations in your life might tempt you into wanting a cigarette.  Never have "just one" cigarette. It leads to wanting another and another. Remind yourself of your decision to quit.  Change habits associated with smoking. If you smoked while driving or when feeling stressed, try other activities to replace smoking. Stand up when drinking your coffee. Brush your teeth after eating. Sit in a different chair when you read the paper. Avoid alcohol while trying to quit, and try to drink fewer caffeinated beverages. Alcohol and caffeine may urge you to smoke.  Avoid foods and drinks that can trigger a desire to smoke, such as sugary or spicy foods and alcohol.  Ask people who smoke not to smoke around you.  Have something planned to do right after eating or having a cup of coffee. For example, plan to take a walk or exercise.  Try a relaxation exercise to calm you down and decrease your stress. Remember, you may be tense and nervous for the first 2 weeks after you quit, but this will pass.  Find new activities to keep your hands busy. Play with a pen, coin, or rubber band. Doodle or draw things on paper.  Brush your teeth right after eating. This will help cut down on the craving for the taste of tobacco after meals. You can also try mouthwash.   Use oral substitutes in place of cigarettes. Try using lemon drops, carrots, cinnamon sticks, or chewing gum. Keep them handy so they are available when you have the urge to smoke.  When you have the urge to smoke, try deep breathing.  Designate your home as a nonsmoking area.  If you are a heavy smoker, ask your health care provider about a prescription for  nicotine chewing gum. It can ease your withdrawal from nicotine.  Reward yourself. Set aside the cigarette money you save and buy yourself something nice.  Look for support from others. Join a support group or smoking cessation program. Ask someone at home or at work to help you with your plan to quit smoking.  Always ask yourself, "Do I need this cigarette or is this just a reflex?" Tell yourself, "Today, I choose not to smoke," or "I do not want to smoke." You are reminding yourself of your decision to quit.  Do not replace cigarette smoking with electronic cigarettes (commonly called e-cigarettes). The safety of e-cigarettes is unknown, and some may contain harmful chemicals.  If you relapse, do not give up! Plan ahead and think about what you will do the next time you get the urge to smoke. HOW WILL I FEEL WHEN I QUIT SMOKING? You may have symptoms of withdrawal because your body is used to nicotine (the addictive substance in cigarettes). You may crave cigarettes, be irritable, feel very hungry, cough often, get headaches, or have difficulty concentrating. The withdrawal symptoms are only temporary. They are strongest when you first quit but will go away within 10-14 days. When withdrawal symptoms occur, stay in control. Think about your reasons for quitting. Remind yourself that these are signs that your body is healing and getting used to being without cigarettes. Remember that withdrawal symptoms are easier to treat than the major diseases that smoking can cause.    Even after the withdrawal is over, expect periodic urges to smoke. However, these cravings are generally short lived and will go away whether you smoke or not. Do not smoke! WHAT RESOURCES ARE AVAILABLE TO HELP ME QUIT SMOKING? Your health care provider can direct you to community resources or hospitals for support, which may include:  Group support.  Education.  Hypnosis.  Therapy. Document Released: 09/19/2003 Document  Revised: 05/07/2013 Document Reviewed: 06/08/2012 [employer] Patient Information 2015 [employer]. This information is not intended to replace advice given to you by your health care provider. Make sure you discuss any questions you have with your health care provider.  

## 2014-10-01 NOTE — Addendum Note (Signed)
Addended by: Adria Dill L on: 10/01/2014 02:15 PM   Modules accepted: Orders

## 2015-03-26 ENCOUNTER — Encounter: Payer: Self-pay | Admitting: Family

## 2015-04-01 ENCOUNTER — Ambulatory Visit (INDEPENDENT_AMBULATORY_CARE_PROVIDER_SITE_OTHER): Payer: Medicare Other | Admitting: Family

## 2015-04-01 ENCOUNTER — Ambulatory Visit (HOSPITAL_COMMUNITY)
Admission: RE | Admit: 2015-04-01 | Discharge: 2015-04-01 | Disposition: A | Discharge status: Home / Self Care | Payer: Medicare Other | Source: Ambulatory Visit | Attending: Family | Admitting: Family

## 2015-04-01 ENCOUNTER — Encounter: Payer: Self-pay | Admitting: Family

## 2015-04-01 ENCOUNTER — Ambulatory Visit (INDEPENDENT_AMBULATORY_CARE_PROVIDER_SITE_OTHER)
Admission: RE | Admit: 2015-04-01 | Discharge: 2015-04-01 | Disposition: A | Discharge status: Home / Self Care | Payer: Medicare Other | Source: Ambulatory Visit | Attending: Family | Admitting: Family

## 2015-04-01 VITALS — BP 126/71 | HR 68 | Temp 97.4°F | Resp 18 | Ht 63.5 in | Wt 159.9 lb

## 2015-04-01 DIAGNOSIS — I6523 Occlusion and stenosis of bilateral carotid arteries: Secondary | ICD-10-CM | POA: Diagnosis not present

## 2015-04-01 DIAGNOSIS — I739 Peripheral vascular disease, unspecified: Secondary | ICD-10-CM

## 2015-04-01 DIAGNOSIS — Z95828 Presence of other vascular implants and grafts: Secondary | ICD-10-CM

## 2015-04-01 DIAGNOSIS — Z48812 Encounter for surgical aftercare following surgery on the circulatory system: Secondary | ICD-10-CM | POA: Diagnosis not present

## 2015-04-01 DIAGNOSIS — F17200 Nicotine dependence, unspecified, uncomplicated: Secondary | ICD-10-CM

## 2015-04-01 DIAGNOSIS — I70203 Unspecified atherosclerosis of native arteries of extremities, bilateral legs: Secondary | ICD-10-CM | POA: Insufficient documentation

## 2015-04-01 DIAGNOSIS — Z720 Tobacco use: Secondary | ICD-10-CM

## 2015-04-01 NOTE — Patient Instructions (Signed)
Stroke Prevention Some medical conditions and behaviors are associated with an increased chance of having a stroke. You may prevent a stroke by making healthy choices and managing medical conditions. HOW CAN I REDUCE MY RISK OF HAVING A STROKE?   Stay physically active. Get at least 30 minutes of activity on most or all days.  Do not smoke. It may also be helpful to avoid exposure to secondhand smoke.  Limit alcohol use. Moderate alcohol use is considered to be:  No more than 2 drinks per day for men.  No more than 1 drink per day for nonpregnant women.  Eat healthy foods. This involves:  Eating 5 or more servings of fruits and vegetables a day.  Making dietary changes that address high blood pressure (hypertension), high cholesterol, diabetes, or obesity.  Manage your cholesterol levels.  Making food choices that are high in fiber and low in saturated fat, trans fat, and cholesterol may control cholesterol levels.  Take any prescribed medicines to control cholesterol as directed by your health care provider.  Manage your diabetes.  Controlling your carbohydrate and sugar intake is recommended to manage diabetes.  Take any prescribed medicines to control diabetes as directed by your health care provider.  Control your hypertension.  Making food choices that are low in salt (sodium), saturated fat, trans fat, and cholesterol is recommended to manage hypertension.  Ask your health care provider if you need treatment to lower your blood pressure. Take any prescribed medicines to control hypertension as directed by your health care provider.  If you are 18-39 years of age, have your blood pressure checked every 3-5 years. If you are 40 years of age or older, have your blood pressure checked every year.  Maintain a healthy weight.  Reducing calorie intake and making food choices that are low in sodium, saturated fat, trans fat, and cholesterol are recommended to manage  weight.  Stop drug abuse.  Avoid taking birth control pills.  Talk to your health care provider about the risks of taking birth control pills if you are over 35 years old, smoke, get migraines, or have ever had a blood clot.  Get evaluated for sleep disorders (sleep apnea).  Talk to your health care provider about getting a sleep evaluation if you snore a lot or have excessive sleepiness.  Take medicines only as directed by your health care provider.  For some people, aspirin or blood thinners (anticoagulants) are helpful in reducing the risk of forming abnormal blood clots that can lead to stroke. If you have the irregular heart rhythm of atrial fibrillation, you should be on a blood thinner unless there is a good reason you cannot take them.  Understand all your medicine instructions.  Make sure that other conditions (such as anemia or atherosclerosis) are addressed. SEEK IMMEDIATE MEDICAL CARE IF:   You have sudden weakness or numbness of the face, arm, or leg, especially on one side of the body.  Your face or eyelid droops to one side.  You have sudden confusion.  You have trouble speaking (aphasia) or understanding.  You have sudden trouble seeing in one or both eyes.  You have sudden trouble walking.  You have dizziness.  You have a loss of balance or coordination.  You have a sudden, severe headache with no known cause.  You have new chest pain or an irregular heartbeat. Any of these symptoms may represent a serious problem that is an emergency. Do not wait to see if the symptoms will   go away. Get medical help at once. Call your local emergency services (911 in [place] Do not drive yourself to the hospital.   This information is not intended to replace advice given to you by your health care provider. Make sure you discuss any questions you have with your health care provider.   Document Released: 01/29/2004 Document Revised: 01/11/2014 Document Reviewed:  06/23/2012 Elsevier Interactive Patient Education 2016 [employer]    Peripheral Vascular Disease Peripheral vascular disease (PVD) is a disease of the blood vessels that are not part of your heart and brain. A simple term for PVD is poor circulation. In most cases, PVD narrows the blood vessels that carry blood from your heart to the rest of your body. This can result in a decreased supply of blood to your arms, legs, and internal organs, like your stomach or kidneys. However, it most often affects a person's lower legs and feet. There are two types of PVD.  Organic PVD. This is the more common type. It is caused by damage to the structure of blood vessels.  Functional PVD. This is caused by conditions that make blood vessels contract and tighten (spasm). Without treatment, PVD tends to get worse over time. PVD can also lead to acute ischemic limb. This is when an arm or limb suddenly has trouble getting enough blood. This is a medical emergency. CAUSES Each type of PVD has many different causes. The most common cause of PVD is buildup of a fatty material (plaque) inside of your arteries (atherosclerosis). Small amounts of plaque can break off from the walls of the blood vessels and become lodged in a smaller artery. This blocks blood flow and can cause acute ischemic limb. Other common causes of PVD include:  Blood clots that form inside of blood vessels.  Injuries to blood vessels.  Diseases that cause inflammation of blood vessels or cause blood vessel spasms.  Health behaviors and health history that increase your risk of developing PVD. RISK FACTORS  You may have a greater risk of PVD if you:  Have a family history of PVD.  Have certain medical conditions, including:  High cholesterol.  Diabetes.  High blood pressure (hypertension).  Coronary heart disease.  Past problems with blood clots.  Past injury, such as burns or a broken bone. These may have damaged blood  vessels in your limbs.  Buerger disease. This is caused by inflamed blood vessels in your hands and feet.  Some forms of arthritis.  Rare birth defects that affect the arteries in your legs.  Use tobacco.  Do not get enough exercise.  Are obese.  Are age 50 or older. SIGNS AND SYMPTOMS  PVD may cause many different symptoms. Your symptoms depend on what part of your body is not getting enough blood. Some common signs and symptoms include:  Cramps in your lower legs. This may be a symptom of poor leg circulation (claudication).  Pain and weakness in your legs while you are physically active that goes away when you rest (intermittent claudication).  Leg pain when at rest.  Leg numbness, tingling, or weakness.  Coldness in a leg or foot, especially when compared with the other leg.  Skin or hair changes. These can include:  Hair loss.  Shiny skin.  Pale or bluish skin.  Thick toenails.  Inability to get or maintain an erection (erectile dysfunction). People with PVD are more prone to developing ulcers and sores on their toes, feet, or legs. These may take longer than   normal to heal. DIAGNOSIS Your health care provider may diagnose PVD from your signs and symptoms. The health care provider will also do a physical exam. You may have tests to find out what is causing your PVD and determine its severity. Tests may include:  Blood pressure recordings from your arms and legs and measurements of the strength of your pulses (pulse volume recordings).  Imaging studies using sound waves to take pictures of the blood flow through your blood vessels (Doppler ultrasound).  Injecting a dye into your blood vessels before having imaging studies using:  X-rays (angiogram or arteriogram).  Computer-generated X-rays (CT angiogram).  A powerful electromagnetic field and a computer (magnetic resonance angiogram or MRA). TREATMENT Treatment for PVD depends on the cause of your condition  and the severity of your symptoms. It also depends on your age. Underlying causes need to be treated and controlled. These include long-lasting (chronic) conditions, such as diabetes, high cholesterol, and high blood pressure. You may need to first try making lifestyle changes and taking medicines. Surgery may be needed if these do not work. Lifestyle changes may include:  Quitting smoking.  Exercising regularly.  Following a low-fat, low-cholesterol diet. Medicines may include:  Blood thinners to prevent blood clots.  Medicines to improve blood flow.  Medicines to improve your blood cholesterol levels. Surgical procedures may include:  A procedure that uses an inflated balloon to open a blocked artery and improve blood flow (angioplasty).  A procedure to put in a tube (stent) to keep a blocked artery open (stent implant).  Surgery to reroute blood flow around a blocked artery (peripheral bypass surgery).  Surgery to remove dead tissue from an infected wound on the affected limb.  Amputation. This is surgical removal of the affected limb. This may be necessary in cases of acute ischemic limb that are not improved through medical or surgical treatments. HOME CARE INSTRUCTIONS  Take medicines only as directed by your health care provider.  Do not use any tobacco products, including cigarettes, chewing tobacco, or electronic cigarettes. If you need help quitting, ask your health care provider.  Lose weight if you are overweight, and maintain a healthy weight as directed by your health care provider.  Eat a diet that is low in fat and cholesterol. If you need help, ask your health care provider.  Exercise regularly. Ask your health care provider to suggest some good activities for you.  Use compression stockings or other mechanical devices as directed by your health care provider.  Take good care of your feet.  Wear comfortable shoes that fit well.  Check your feet often for  any cuts or sores. SEEK MEDICAL CARE IF:  You have cramps in your legs while walking.  You have leg pain when you are at rest.  You have coldness in a leg or foot.  Your skin changes.  You have erectile dysfunction.  You have cuts or sores on your feet that are not healing. SEEK IMMEDIATE MEDICAL CARE IF:  Your arm or leg turns cold and blue.  Your arms or legs become red, warm, swollen, painful, or numb.  You have chest pain or trouble breathing.  You suddenly have weakness in your face, arm, or leg.  You become very confused or lose the ability to speak.  You suddenly have a very bad headache or lose your vision.   This information is not intended to replace advice given to you by your health care provider. Make sure you discuss any questions   you have with your health care provider.   Document Released: 01/29/2004 Document Revised: 01/11/2014 Document Reviewed: 05/31/2013 Elsevier Interactive Patient Education 2016 [employer]    Smoking Cessation, Tips for Success If you are ready to quit smoking, congratulations! You have chosen to help yourself be healthier. Cigarettes bring nicotine, tar, carbon monoxide, and other irritants into your body. Your lungs, heart, and blood vessels will be able to work better without these poisons. There are many different ways to quit smoking. Nicotine gum, nicotine patches, a nicotine inhaler, or nicotine nasal spray can help with physical craving. Hypnosis, support groups, and medicines help break the habit of smoking. WHAT THINGS CAN I DO TO MAKE QUITTING EASIER?  Here are some tips to help you quit for good:  Pick a date when you will quit smoking completely. Tell all of your friends and family about your plan to quit on that date.  Do not try to slowly cut down on the number of cigarettes you are smoking. Pick a quit date and quit smoking completely starting on that day.  Throw away all cigarettes.   Clean and remove all  ashtrays from your home, work, and car.  On a card, write down your reasons for quitting. Carry the card with you and read it when you get the urge to smoke.  Cleanse your body of nicotine. Drink enough water and fluids to keep your urine clear or pale yellow. Do this after quitting to flush the nicotine from your body.  Learn to predict your moods. Do not let a bad situation be your excuse to have a cigarette. Some situations in your life might tempt you into wanting a cigarette.  Never have "just one" cigarette. It leads to wanting another and another. Remind yourself of your decision to quit.  Change habits associated with smoking. If you smoked while driving or when feeling stressed, try other activities to replace smoking. Stand up when drinking your coffee. Brush your teeth after eating. Sit in a different chair when you read the paper. Avoid alcohol while trying to quit, and try to drink fewer caffeinated beverages. Alcohol and caffeine may urge you to smoke.  Avoid foods and drinks that can trigger a desire to smoke, such as sugary or spicy foods and alcohol.  Ask people who smoke not to smoke around you.  Have something planned to do right after eating or having a cup of coffee. For example, plan to take a walk or exercise.  Try a relaxation exercise to calm you down and decrease your stress. Remember, you may be tense and nervous for the first 2 weeks after you quit, but this will pass.  Find new activities to keep your hands busy. Play with a pen, coin, or rubber band. Doodle or draw things on paper.  Brush your teeth right after eating. This will help cut down on the craving for the taste of tobacco after meals. You can also try mouthwash.   Use oral substitutes in place of cigarettes. Try using lemon drops, carrots, cinnamon sticks, or chewing gum. Keep them handy so they are available when you have the urge to smoke.  When you have the urge to smoke, try deep  breathing.  Designate your home as a nonsmoking area.  If you are a heavy smoker, ask your health care provider about a prescription for nicotine chewing gum. It can ease your withdrawal from nicotine.  Reward yourself. Set aside the cigarette money you save and buy yourself   something nice.  Look for support from others. Join a support group or smoking cessation program. Ask someone at home or at work to help you with your plan to quit smoking.  Always ask yourself, "Do I need this cigarette or is this just a reflex?" Tell yourself, "Today, I choose not to smoke," or "I do not want to smoke." You are reminding yourself of your decision to quit.  Do not replace cigarette smoking with electronic cigarettes (commonly called e-cigarettes). The safety of e-cigarettes is unknown, and some may contain harmful chemicals.  If you relapse, do not give up! Plan ahead and think about what you will do the next time you get the urge to smoke. HOW WILL I FEEL WHEN I QUIT SMOKING? You may have symptoms of withdrawal because your body is used to nicotine (the addictive substance in cigarettes). You may crave cigarettes, be irritable, feel very hungry, cough often, get headaches, or have difficulty concentrating. The withdrawal symptoms are only temporary. They are strongest when you first quit but will go away within 10-14 days. When withdrawal symptoms occur, stay in control. Think about your reasons for quitting. Remind yourself that these are signs that your body is healing and getting used to being without cigarettes. Remember that withdrawal symptoms are easier to treat than the major diseases that smoking can cause.  Even after the withdrawal is over, expect periodic urges to smoke. However, these cravings are generally short lived and will go away whether you smoke or not. Do not smoke! WHAT RESOURCES ARE AVAILABLE TO HELP ME QUIT SMOKING? Your health care provider can direct you to community resources or  hospitals for support, which may include:  Group support.  Education.  Hypnosis.  Therapy.   This information is not intended to replace advice given to you by your health care provider. Make sure you discuss any questions you have with your health care provider.   Document Released: 09/19/2003 Document Revised: 01/11/2014 Document Reviewed: 06/08/2012 Elsevier Interactive Patient Education 2016 [employer]  

## 2015-04-01 NOTE — Progress Notes (Signed)
[facility] HISTORY AND PHYSICAL   MRN : [id number]  History of Present Illness:   [name] is a 75 y.o. female patient of Dr. Hart Rochester who is s/p left femoropopliteal artery bypass graft using saphenous vein on 10/12/2012, left superficial femoral artery stent 03/11/2005, left popliteal artery stent 04/30/2009, and right superficial femoral artery stent on 10/17/2009.  She is also being monitored for carotid artery disease. Dr. Hart Rochester last saw pt on 03/26/14. At that time his assessment was that there was excellent flow throughout the left leg graft with minimal elevated velocity at the distal anastomosis.  She returns today for follow up. Her claudication symptoms are completely relieved. She does have persistent edema from the ankle to the knee. She does elevate her legs at night and intermittently during the day. She thinks it is slowly improving. She has no other complaints. She denies claudication symptoms with walking, denies non healing wounds.  She had a TIA in 2008 as manifested by weakness in right hand which has resolved, no further TIA or stroke activity.  The patient reports New Medical or Surgical History: none.  Pt Diabetic: yes, states in good control, states her last A1C was 6.0 Pt smoker: smoker (1/3 ppd, started in her 20's)  Pt meds include: Statin :Yes ASA: Yes Other anticoagulants/antiplatelets: Plavix     Current Outpatient Prescriptions  Medication Sig Dispense Refill  . Ascorbic Acid (VITAMIN C) 1000 MG tablet Take 1,000 mg by mouth 2 (two) times daily.    Marland Kitchen aspirin 81 MG tablet Take 81 mg by mouth daily.    . clopidogrel (PLAVIX) 75 MG tablet Take 75 mg by mouth daily.    Marland Kitchen ibuprofen (ADVIL) 200 MG tablet Take 400 mg by mouth daily as needed for pain.    Marland Kitchen losartan (COZAAR) 50 MG tablet Take 50 mg by mouth Daily.    . metFORMIN (GLUCOPHAGE) 500 MG tablet Take by mouth 2 (two) times daily with a meal.    . simvastatin  (ZOCOR) 80 MG tablet Take 80 mg by mouth at bedtime.     No current facility-administered medications for this visit.    Past Medical History  Diagnosis Date  . Hyperlipidemia   . 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 Hypertension   . Carotid artery occlusion   . Stroke 2008  . Diabetes mellitus     borderline , no longer treated /w medicine   . Coronary artery disease     Social History Social History  Substance Use Topics  . Smoking status: Light Tobacco Smoker -- 0.25 packs/day for 50 years    Types: Cigarettes  . Smokeless tobacco: Never Used     Comment: pt states she smokes about 4-5 cigs per day and is trying to quit  . Alcohol Use: No    Family History Family History  Problem Relation Age of Onset  . Coronary artery disease Mother   . Heart disease Mother     After age 19  . Heart attack Mother   . Coronary artery disease Sister   . Heart disease Sister     Before  age 55 -  Heart Transplant  . Cancer Sister     Kidney  . Heart attack Sister   . Heart disease Father     After age 83  . Other Father     amputation  Surgical History Past Surgical History  Procedure Laterality Date  . Tubal ligation    . Right sfa stent   2010  . Left popliteal stent   04-30-2009  . Inguinal hernia repair      right  . Sfa stent  04-2005    LEFT  . Femoral-popliteal bypass graft Left 10/12/2012    Procedure: BYPASS GRAFT FEMORAL-POPLITEAL ARTERY;  Surgeon: Pryor Ochoa, MD;  Location: [facility] OR;  Service: Vascular;  Laterality: Left;  . Abdominal aortagram N/A 10/03/2012    Procedure: ABDOMINAL Ronny Flurry;  Surgeon: Nada Libman, MD;  Location: [facility] CATH LAB;  Service: Cardiovascular;  Laterality: N/A;  . Lower extremity angiogram Bilateral 10/03/2012    Procedure: LOWER EXTREMITY ANGIOGRAM;  Surgeon: Nada Libman, MD;  Location: [facility] CATH LAB;  Service: Cardiovascular;  Laterality:  Bilateral;    No Known Allergies  Current Outpatient Prescriptions  Medication Sig Dispense Refill  . Ascorbic Acid (VITAMIN C) 1000 MG tablet Take 1,000 mg by mouth 2 (two) times daily.    Marland Kitchen aspirin 81 MG tablet Take 81 mg by mouth daily.    . clopidogrel (PLAVIX) 75 MG tablet Take 75 mg by mouth daily.    Marland Kitchen ibuprofen (ADVIL) 200 MG tablet Take 400 mg by mouth daily as needed for pain.    Marland Kitchen losartan (COZAAR) 50 MG tablet Take 50 mg by mouth Daily.    . metFORMIN (GLUCOPHAGE) 500 MG tablet Take by mouth 2 (two) times daily with a meal.    . simvastatin (ZOCOR) 80 MG tablet Take 80 mg by mouth at bedtime.     No current facility-administered medications for this visit.     REVIEW OF SYSTEMS: See HPI for pertinent positives and negatives.  Physical Examination Filed Vitals:   04/01/15 1539  BP: 126/71  Pulse: 68  Temp: 97.4 F (36.3 C)  TempSrc: Oral  Resp: 18  Height: 5' 3.5" (1.613 m)  Weight: 159 lb 14.4 oz (72.53 kg)  SpO2: 95%   Body mass index is 27.88 kg/(m^2).   General: A&O x 3, WDWN. Gait: normal Eyes: PERRLA. Pulmonary: CTAB, respirations are non labored, without wheezes , rales or rhonchi. Cardiac: regular rhythm, no detected murmur.     Carotid Bruits Right Left   Negative Negative  Aorta is not palpable. Radial pulses: are 2+ palpable and =   VASCULAR EXAM: Extremities without ischemic changes  without Gangrene; without open wounds. No peripheral edema.      LE Pulses Right Left   FEMORAL  palpable  palpable    POPLITEAL not palpable  not palpable   POSTERIOR TIBIAL not palpable  not palpable    DORSALIS PEDIS  ANTERIOR TIBIAL faintly palpable  faintly palpable    Abdomen: soft, NT, no palpable  masses. Skin: no rashes, no ulcers. Musculoskeletal: no muscle wasting or atrophy. Neurologic: A&O X 3; Appropriate Affect; MOTOR FUNCTION: moving all extremities equally, motor strength 5/5 throughout. Speech is fluent/normal. CN 2-12 intact.                Non-Invasive Vascular Imaging (04/01/2015):  Carotid Duplex: Right ICA: 1-39% stenosis Left ICA: 40-59% stenosis Velocities (stenosis) appears less than the exam on 03/26/14.  Left LE Arterial Duplex: Patent left leg bypass graft and right SFA stent. 50-74% stenosis of right SFA origin, greater than 50% proximal and distal stent stenosis, and 50-70%left distal anastomoses stenosis.  No significant change noted when compared to the exam on 09/30/14.  ABI: Right: 0.83 (  0.88, 09/30/14), waveforms: bi and monophasic, TBI: 0.38 Left: 0.81 (0.85), waveforms: mono and biphasic, TBI: 0.50    ASSESSMENT:  Lenore MannerMalinda I Brislin is a 75 y.o. female who is s/p left femoropopliteal artery bypass graft using saphenous vein on 10/12/2012, left superficial femoral artery stent 03/11/2005, left popliteal artery stent 04/30/2009, and right superficial femoral artery stent on 10/17/2009.  She is also being monitored for carotid artery disease.  Dr. Hart RochesterLawson last saw pt on 03/26/14. At that time his assessment was that there was excellent flow throughout the left leg graft with minimal elevated velocity at the distal anastomosis.  Pt has no claudication sx's with walking, has no signs of ischemia in her feet/legs.  Today's bilateral LE arterial duplex suggests a patent left leg bypass graft and right SFA stent. 50-74% stenosis of right SFA origin, greater than 50% proximal and distal stent stenosis, and 50-70%left distal anastomoses stenosis.  No significant change noted when compared to the exam on 09/30/14. Bilateral LE ABI are slightly diminished compared to six months ago, both with moderate arterial occlusive disease.  She had a TIA in  2008 as manifested by weakness in right hand which has resolved, no further TIA or stroke activity.   Today's carotid duplex suggests Right ICA: 1-39% stenosis Left ICA: 40-59% stenosis Velocities (stenosis) appears less than the exam on 03/26/14.   Her DM seems to be in good control. Her primary atherosclerotic risk factor remains her smoking, see Plan.   PLAN:   Graduated walking program discussed in detail. The patient was counseled re smoking cessation and given several free resources re smoking cessation.   Based on today's exam and non-invasive vascular lab results, the patient will follow up in 1 year with the following tests: carotid duplex, bilateral LE arterial duplex, and ABI's. I discussed in depth with the patient the nature of atherosclerosis, and emphasized the importance of maximal medical management including strict control of blood pressure, blood glucose, and lipid levels, obtaining regular exercise, and cessation of smoking.  The patient is aware that without maximal medical management the underlying atherosclerotic disease process will progress, limiting the benefit of any interventions.  The patient was given information about stroke prevention and what symptoms should prompt the patient to seek immediate medical care.  The patient was given information about PAD including signs, symptoms, treatment, what symptoms should prompt the patient to seek immediate medical care, and risk reduction measures to take. Thank you for allowing us to participate in this patient's care.  Charisse MarchSuzanne Nickel, RN, MSN, FNP-C [facility] Office: [phone]  Clinic MD: Hart RochesterLawson 04/01/2015 3:20 PM

## 2015-09-09 DIAGNOSIS — E119 Type 2 diabetes mellitus without complications: Secondary | ICD-10-CM | POA: Insufficient documentation

## 2015-09-09 DIAGNOSIS — M899 Disorder of bone, unspecified: Secondary | ICD-10-CM | POA: Insufficient documentation

## 2015-09-09 DIAGNOSIS — E785 Hyperlipidemia, unspecified: Secondary | ICD-10-CM | POA: Insufficient documentation

## 2015-09-09 DIAGNOSIS — Z780 Asymptomatic menopausal state: Secondary | ICD-10-CM | POA: Insufficient documentation

## 2016-03-26 ENCOUNTER — Encounter: Payer: Self-pay | Admitting: Family

## 2016-03-29 NOTE — Addendum Note (Signed)
Addended by: Burton ApleyPETTY, Usama Harkless A on: 03/29/2016 04:37 PM   Modules accepted: Orders

## 2016-04-06 ENCOUNTER — Encounter: Payer: Self-pay | Admitting: Family

## 2016-04-06 ENCOUNTER — Ambulatory Visit (INDEPENDENT_AMBULATORY_CARE_PROVIDER_SITE_OTHER)
Admission: RE | Admit: 2016-04-06 | Discharge: 2016-04-06 | Disposition: A | Discharge status: Home / Self Care | Payer: Medicare Other | Source: Ambulatory Visit | Attending: Family | Admitting: Family

## 2016-04-06 ENCOUNTER — Ambulatory Visit (HOSPITAL_COMMUNITY)
Admission: RE | Admit: 2016-04-06 | Discharge: 2016-04-06 | Disposition: A | Discharge status: Home / Self Care | Payer: Medicare Other | Source: Ambulatory Visit | Attending: Family | Admitting: Family

## 2016-04-06 ENCOUNTER — Ambulatory Visit (INDEPENDENT_AMBULATORY_CARE_PROVIDER_SITE_OTHER): Payer: Medicare Other | Admitting: Family

## 2016-04-06 VITALS — BP 160/73 | HR 69 | Temp 97.1°F | Resp 18 | Ht 63.5 in | Wt 159.6 lb

## 2016-04-06 DIAGNOSIS — I739 Peripheral vascular disease, unspecified: Secondary | ICD-10-CM | POA: Insufficient documentation

## 2016-04-06 DIAGNOSIS — Z95828 Presence of other vascular implants and grafts: Secondary | ICD-10-CM

## 2016-04-06 DIAGNOSIS — I6523 Occlusion and stenosis of bilateral carotid arteries: Secondary | ICD-10-CM

## 2016-04-06 DIAGNOSIS — R0989 Other specified symptoms and signs involving the circulatory and respiratory systems: Secondary | ICD-10-CM | POA: Insufficient documentation

## 2016-04-06 DIAGNOSIS — T82858A Stenosis of vascular prosthetic devices, implants and grafts, initial encounter: Secondary | ICD-10-CM | POA: Diagnosis not present

## 2016-04-06 DIAGNOSIS — Y832 Surgical operation with anastomosis, bypass or graft as the cause of abnormal reaction of the patient, or of later complication, without mention of misadventure at the time of the procedure: Secondary | ICD-10-CM | POA: Insufficient documentation

## 2016-04-06 DIAGNOSIS — T82856A Stenosis of peripheral vascular stent, initial encounter: Secondary | ICD-10-CM | POA: Insufficient documentation

## 2016-04-06 DIAGNOSIS — Y838 Other surgical procedures as the cause of abnormal reaction of the patient, or of later complication, without mention of misadventure at the time of the procedure: Secondary | ICD-10-CM | POA: Diagnosis not present

## 2016-04-06 DIAGNOSIS — F17200 Nicotine dependence, unspecified, uncomplicated: Secondary | ICD-10-CM

## 2016-04-06 NOTE — Patient Instructions (Signed)
Stroke Prevention Some medical conditions and behaviors are associated with an increased chance of having a stroke. You may prevent a stroke by making healthy choices and managing medical conditions. How can I reduce my risk of having a stroke?  Stay physically active. Get at least 30 minutes of activity on most or all days.  Do not smoke. It may also be helpful to avoid exposure to secondhand smoke.  Limit alcohol use. Moderate alcohol use is considered to be:  No more than 2 drinks per day for men.  No more than 1 drink per day for nonpregnant women.  Eat healthy foods. This involves:  Eating 5 or more servings of fruits and vegetables a day.  Making dietary changes that address high blood pressure (hypertension), high cholesterol, diabetes, or obesity.  Manage your cholesterol levels.  Making food choices that are high in fiber and low in saturated fat, trans fat, and cholesterol may control cholesterol levels.  Take any prescribed medicines to control cholesterol as directed by your health care provider.  Manage your diabetes.  Controlling your carbohydrate and sugar intake is recommended to manage diabetes.  Take any prescribed medicines to control diabetes as directed by your health care provider.  Control your hypertension.  Making food choices that are low in salt (sodium), saturated fat, trans fat, and cholesterol is recommended to manage hypertension.  Ask your health care provider if you need treatment to lower your blood pressure. Take any prescribed medicines to control hypertension as directed by your health care provider.  If you are 18-39 years of age, have your blood pressure checked every 3-5 years. If you are 40 years of age or older, have your blood pressure checked every year.  Maintain a healthy weight.  Reducing calorie intake and making food choices that are low in sodium, saturated fat, trans fat, and cholesterol are recommended to manage  weight.  Stop drug abuse.  Avoid taking birth control pills.  Talk to your health care provider about the risks of taking birth control pills if you are over 35 years old, smoke, get migraines, or have ever had a blood clot.  Get evaluated for sleep disorders (sleep apnea).  Talk to your health care provider about getting a sleep evaluation if you snore a lot or have excessive sleepiness.  Take medicines only as directed by your health care provider.  For some people, aspirin or blood thinners (anticoagulants) are helpful in reducing the risk of forming abnormal blood clots that can lead to stroke. If you have the irregular heart rhythm of atrial fibrillation, you should be on a blood thinner unless there is a good reason you cannot take them.  Understand all your medicine instructions.  Make sure that other conditions (such as anemia or atherosclerosis) are addressed. Get help right away if:  You have sudden weakness or numbness of the face, arm, or leg, especially on one side of the body.  Your face or eyelid droops to one side.  You have sudden confusion.  You have trouble speaking (aphasia) or understanding.  You have sudden trouble seeing in one or both eyes.  You have sudden trouble walking.  You have dizziness.  You have a loss of balance or coordination.  You have a sudden, severe headache with no known cause.  You have new chest pain or an irregular heartbeat. Any of these symptoms may represent a serious problem that is an emergency. Do not wait to see if the symptoms will go away.   Get medical help at once. Call your local emergency services (911 in [place] Do not drive yourself to the hospital. This information is not intended to replace advice given to you by your health care provider. Make sure you discuss any questions you have with your health care provider. Document Released: 01/29/2004 Document Revised: 05/29/2015 Document Reviewed: 06/23/2012 Elsevier  Interactive Patient Education  2017 Elsevier Inc.     Peripheral Vascular Disease Peripheral vascular disease (PVD) is a disease of the blood vessels that are not part of your heart and brain. A simple term for PVD is poor circulation. In most cases, PVD narrows the blood vessels that carry blood from your heart to the rest of your body. This can result in a decreased supply of blood to your arms, legs, and internal organs, like your stomach or kidneys. However, it most often affects a person's lower legs and feet. There are two types of PVD.  Organic PVD. This is the more common type. It is caused by damage to the structure of blood vessels.  Functional PVD. This is caused by conditions that make blood vessels contract and tighten (spasm). Without treatment, PVD tends to get worse over time. PVD can also lead to acute ischemic limb. This is when an arm or limb suddenly has trouble getting enough blood. This is a medical emergency. Follow these instructions at home:  Take medicines only as told by your doctor.  Do not use any tobacco products, including cigarettes, chewing tobacco, or electronic cigarettes. If you need help quitting, ask your doctor.  Lose weight if you are overweight, and maintain a healthy weight as told by your doctor.  Eat a diet that is low in fat and cholesterol. If you need help, ask your doctor.  Exercise regularly. Ask your doctor for some good activities for you.  Take good care of your feet.  Wear comfortable shoes that fit well.  Check your feet often for any cuts or sores. Contact a doctor if:  You have cramps in your legs while walking.  You have leg pain when you are at rest.  You have coldness in a leg or foot.  Your skin changes.  You are unable to get or have an erection (erectile dysfunction).  You have cuts or sores on your feet that are not healing. Get help right away if:  Your arm or leg turns cold and blue.  Your arms or legs  become red, warm, swollen, painful, or numb.  You have chest pain or trouble breathing.  You suddenly have weakness in your face, arm, or leg.  You become very confused or you cannot speak.  You suddenly have a very bad headache.  You suddenly cannot see. This information is not intended to replace advice given to you by your health care provider. Make sure you discuss any questions you have with your health care provider. Document Released: 03/17/2009 Document Revised: 05/29/2015 Document Reviewed: 05/31/2013 Elsevier Interactive Patient Education  2017 Elsevier Inc.       Steps to Quit Smoking Smoking tobacco can be bad for your health. It can also affect almost every organ in your body. Smoking puts you and people around you at risk for many serious long-lasting (chronic) diseases. Quitting smoking is hard, but it is one of the best things that you can do for your health. It is never too late to quit. What are the benefits of quitting smoking? When you quit smoking, you lower your risk for getting serious   diseases and conditions. They can include:  Lung cancer or lung disease.  Heart disease.  Stroke.  Heart attack.  Not being able to have children (infertility).  Weak bones (osteoporosis) and broken bones (fractures). If you have coughing, wheezing, and shortness of breath, those symptoms may get better when you quit. You may also get sick less often. If you are pregnant, quitting smoking can help to lower your chances of having a baby of low birth weight. What can I do to help me quit smoking? Talk with your doctor about what can help you quit smoking. Some things you can do (strategies) include:  Quitting smoking totally, instead of slowly cutting back how much you smoke over a period of time.  Going to in-person counseling. You are more likely to quit if you go to many counseling sessions.  Using resources and support systems, such as:  Online chats with a  counselor.  Phone quitlines.  Printed self-help materials.  Support groups or group counseling.  Text messaging programs.  Mobile phone apps or applications.  Taking medicines. Some of these medicines may have nicotine in them. If you are pregnant or breastfeeding, do not take any medicines to quit smoking unless your doctor says it is okay. Talk with your doctor about counseling or other things that can help you. Talk with your doctor about using more than one strategy at the same time, such as taking medicines while you are also going to in-person counseling. This can help make quitting easier. What things can I do to make it easier to quit? Quitting smoking might feel very hard at first, but there is a lot that you can do to make it easier. Take these steps:  Talk to your family and friends. Ask them to support and encourage you.  Call phone quitlines, reach out to support groups, or work with a counselor.  Ask people who smoke to not smoke around you.  Avoid places that make you want (trigger) to smoke, such as:  Bars.  Parties.  Smoke-break areas at work.  Spend time with people who do not smoke.  Lower the stress in your life. Stress can make you want to smoke. Try these things to help your stress:  Getting regular exercise.  Deep-breathing exercises.  Yoga.  Meditating.  Doing a body scan. To do this, close your eyes, focus on one area of your body at a time from head to toe, and notice which parts of your body are tense. Try to relax the muscles in those areas.  Download or buy apps on your mobile phone or tablet that can help you stick to your quit plan. There are many free apps, such as QuitGuide from the CDC (Centers for Disease Control and Prevention). You can find more support from smokefree.gov and other websites. This information is not intended to replace advice given to you by your health care provider. Make sure you discuss any questions you have with  your health care provider. Document Released: 10/17/2008 Document Revised: 08/19/2015 Document Reviewed: 05/07/2014 Elsevier Interactive Patient Education  2017 Elsevier Inc.  

## 2016-04-06 NOTE — Progress Notes (Signed)
[facility] [place] HISTORY AND PHYSICAL   MRN : [id number]  History of Present Illness:   [name] is a 76 y.o. female patient of Dr. Hart Rochester who is s/p left femoropopliteal artery bypass graft using saphenous vein on 10/12/2012, left superficial femoral artery stent 03/11/2005, left popliteal artery stent 04/30/2009, and right superficial femoral artery stent on 10/17/2009.  She is also being monitored for carotid artery disease. Dr. Hart Rochester last saw pt on 03/26/14. At that time his assessment was that there was excellent flow throughout the left leg graft with minimal elevated velocity at the distal anastomosis.  She returns today for follow up. After walking about 1/2 mile, both feet and calves feel "knotted up", relieved by rest. She denies non healing wounds.  She had a TIA in 2008 as manifested by weakness in right hand which has resolved, no further TIA or stroke activity.  Pt Diabetic: yes, states in good control, states her last A1C was 6.1 Pt smoker: smoker (1/4 ppd, started in her 20's)  Pt meds include: Statin :Yes ASA: Yes Other anticoagulants/antiplatelets: Plavix   Current Outpatient Prescriptions  Medication Sig Dispense Refill  . Ascorbic Acid (VITAMIN C) 1000 MG tablet Take 1,000 mg by mouth 2 (two) times daily.    Marland Kitchen aspirin 81 MG tablet Take 81 mg by mouth daily.    . clopidogrel (PLAVIX) 75 MG tablet Take 75 mg by mouth daily.    Marland Kitchen ibuprofen (ADVIL) 200 MG tablet Take 400 mg by mouth daily as needed for pain.    Marland Kitchen losartan (COZAAR) 50 MG tablet Take 50 mg by mouth Daily.    . metFORMIN (GLUCOPHAGE) 500 MG tablet Take by mouth 2 (two) times daily with a meal.    . OMEGA-3-ACID ETHYL ESTERS PO Take by mouth.    . simvastatin (ZOCOR) 80 MG tablet Take 80 mg by mouth at bedtime.     No current facility-administered medications for this visit.     Past Medical History:  Diagnosis Date  . Carotid artery occlusion   . Coronary  artery disease   . Diabetes mellitus    borderline , no longer treated /w medicine   . Hyperlipidemia   . Hypertension   . Peripheral vascular disease (HCC)    emboli of left foot from left superficial femoral and popliteal occulsive disease  . Stroke [facility]) 2008    Social History Social History  Substance Use Topics  . Smoking status: Light Tobacco Smoker    Packs/day: 0.25    Years: 50.00    Types: Cigarettes  . Smokeless tobacco: Never Used     Comment: pt states she smokes about 4-5 cigs per day and is trying to quit  . Alcohol use No    Family History Family History  Problem Relation Age of Onset  . Coronary artery disease Mother   . Heart disease Mother     After age 25  . Heart attack Mother   . Coronary artery disease Sister   . Heart disease Sister     Before  age 74 -  Heart Transplant  . Cancer Sister     Kidney  . Heart attack Sister   . Heart disease Father     After age 25  . Other Father     amputation    Surgical History Past Surgical History:  Procedure Laterality Date  . ABDOMINAL AORTAGRAM N/A 10/03/2012   Procedure: ABDOMINAL Ronny Flurry;  Surgeon: Nada Libman, MD;  Location: [facility] CATH LAB;  Service: Cardiovascular;  Laterality: N/A;  . FEMORAL-POPLITEAL BYPASS GRAFT Left 10/12/2012   Procedure: BYPASS GRAFT FEMORAL-POPLITEAL ARTERY;  Surgeon: Pryor Ochoa, MD;  Location: [facility] OR;  Service: Vascular;  Laterality: Left;  . INGUINAL HERNIA REPAIR     right  . left popliteal stent   04-30-2009  . LOWER EXTREMITY ANGIOGRAM Bilateral 10/03/2012   Procedure: LOWER EXTREMITY ANGIOGRAM;  Surgeon: Nada Libman, MD;  Location: [facility] CATH LAB;  Service: Cardiovascular;  Laterality: Bilateral;  . right SFA stent   2010  . SFA STENT  04-2005   LEFT  . TUBAL LIGATION      No Known Allergies  Current Outpatient Prescriptions  Medication Sig Dispense Refill  . Ascorbic Acid (VITAMIN C) 1000 MG tablet Take 1,000 mg by mouth 2 (two) times daily.    Marland Kitchen  aspirin 81 MG tablet Take 81 mg by mouth daily.    . clopidogrel (PLAVIX) 75 MG tablet Take 75 mg by mouth daily.    Marland Kitchen ibuprofen (ADVIL) 200 MG tablet Take 400 mg by mouth daily as needed for pain.    Marland Kitchen losartan (COZAAR) 50 MG tablet Take 50 mg by mouth Daily.    . metFORMIN (GLUCOPHAGE) 500 MG tablet Take by mouth 2 (two) times daily with a meal.    . OMEGA-3-ACID ETHYL ESTERS PO Take by mouth.    . simvastatin (ZOCOR) 80 MG tablet Take 80 mg by mouth at bedtime.     No current facility-administered medications for this visit.      REVIEW OF SYSTEMS: See HPI for pertinent positives and negatives.  Physical Examination Vitals:   04/06/16 1410 04/06/16 1411  BP: (!) 148/86 (!) 160/73  Pulse: 69   Resp: 18   Temp: 97.1 F (36.2 C)   TempSrc: Oral   SpO2: 98%   Weight: 159 lb 9.6 oz (72.4 kg)   Height: 5' 3.5" (1.613 m)    Body mass index is 27.83 kg/m.  General:  A&O x 3, WDWN. Gait: normal Eyes: PERRLA. Pulmonary: CTAB, respirations are non labored, without wheezes, rales, or rhonchi. Cardiac: regular rhythm, no detected murmur.     Carotid Bruits Right Left   Negative Negative  Aorta is not palpable. Radial pulses: are 2+ palpable and =   VASCULAR EXAM: Extremities without ischemic changes  without Gangrene; without open wounds. No peripheral edema.      LE Pulses Right Left   FEMORAL  palpable  palpable    POPLITEAL not palpable  1+palpable   POSTERIOR TIBIAL not palpable  not palpable    DORSALIS PEDIS  ANTERIOR TIBIAL faintly palpable  faintly palpable    Abdomen: soft, NT, no palpable masses. Skin: no rashes, no ulcers. Musculoskeletal: no muscle wasting or atrophy. Neurologic: A&O X 3; Appropriate  Affect; MOTOR FUNCTION: moving all extremities equally, motor strength 5/5 throughout. Speech is fluent/normal. CN 2-12 intact      ASSESSMENT:  [name] is a 76 y.o. female who is s/p left femoropopliteal artery bypass graft using saphenous vein on 10/12/2012, left superficial femoral artery stent 03/11/2005, left popliteal artery stent 04/30/2009, and right superficial femoral artery stent on 10/17/2009.  Pt states that Dr. Myra Gianotti placed one of the stents in her legs.   She is also being monitored for carotid artery disease.  She had a TIA in 2008 as manifested by weakness in right hand which has resolved, no further TIA or stroke activity.    Dr.  Hart Rochester last saw pt on 03/26/14. At that time his assessment was that there was excellent flow throughout the left leg graft with minimal elevated velocity at the distal anastomosis.  After walking about 1/2 mile, both feet and calves feel "knotted up", relieved by rest. There are no signs of ischemia in her feet/legs.  Her DM seems to be in good control. Her primary atherosclerotic risk factor remains her smoking, see Plan.   DATA   Today's carotid duplex suggests Right ICA: 1-39% stenosis Left ICA: 60-79% stenosis. Bilateral vertebral artery flow is antegrade.  Bilateral subclavian artery waveforms are normal.  Velocities (stenosis) are stable and consistent with the exam on 03/26/14, but increased velocity in the left ICA compared to the exam on 04-01-15.   Today's bilateral LE arterial duplex suggests right SFA origin stenosis of 30-49%. 50-99% stenosis of the right SFA stent approximately 3 cm from the stent origin. 50-79% stenosis of the left bypass graft distal anastomosis. Significant disease progression of the right SFA artery stent; the left bypass graft remains stable.  ABI: Right: 0.64 (0.83, 04-01-15), waveforms: monophasic; TBI: 0.44 (was 0.38) Left: 0.97 (0.81, 04-01-15), waveforms: PT: monophasic, DP: triphasic; TBI:  0.65 (was 0.50) Decline in right ABI, improved in left ABI.    PLAN:   Graduated walking program discussed in detail. The patient was counseled re smoking cessation and given several free resources re smoking cessation.   Based on today's exam and non-invasive vascular lab results, and after discussing with Dr. Arbie Cookey, the patient will follow up in 6 months with the following tests: carotid duplex, bilateral LE arterial duplex, and ABI's, see Dr. Myra Gianotti afterward.   I discussed in depth with the patient the nature of atherosclerosis, and emphasized the importance of maximal medical management including strict control of blood pressure, blood glucose, and lipid levels, obtaining regular exercise, and cessation of smoking.  The patient is aware that without maximal medical management the underlying atherosclerotic disease process will progress, limiting the benefit of any interventions.  The patient was given information about stroke prevention and what symptoms should prompt the patient to seek immediate medical care. ms. The patient was given information about PAD including signs, symptoms, treatment, what symptoms should prompt the patient to seek immediate medical care, and risk reduction measures to take. Thank you for allowing [place] to participate in this patient's care.  Charisse March, RN, MSN, FNP-C [facility] Office: [phone]  Clinic MD: Early 04/06/2016 2:18 PM

## 2016-04-07 NOTE — Addendum Note (Signed)
Addended by: Burton Apley A on: 04/07/2016 09:39 AM   Modules accepted: Orders

## 2016-11-01 ENCOUNTER — Ambulatory Visit (INDEPENDENT_AMBULATORY_CARE_PROVIDER_SITE_OTHER)
Admission: RE | Admit: 2016-11-01 | Discharge: 2016-11-01 | Disposition: A | Discharge status: Home / Self Care | Payer: Medicare Other | Source: Ambulatory Visit | Attending: Surgery | Admitting: Surgery

## 2016-11-01 ENCOUNTER — Ambulatory Visit (HOSPITAL_COMMUNITY)
Admission: RE | Admit: 2016-11-01 | Discharge: 2016-11-01 | Disposition: A | Discharge status: Home / Self Care | Payer: Medicare Other | Source: Ambulatory Visit | Attending: Surgery | Admitting: Surgery

## 2016-11-01 ENCOUNTER — Encounter: Payer: Self-pay | Admitting: Surgery

## 2016-11-01 ENCOUNTER — Ambulatory Visit (INDEPENDENT_AMBULATORY_CARE_PROVIDER_SITE_OTHER): Payer: Medicare Other | Admitting: Surgery

## 2016-11-01 VITALS — BP 150/76 | HR 76 | Temp 97.6°F | Resp 16 | Ht 63.5 in | Wt 156.0 lb

## 2016-11-01 DIAGNOSIS — I739 Peripheral vascular disease, unspecified: Secondary | ICD-10-CM | POA: Insufficient documentation

## 2016-11-01 DIAGNOSIS — I6523 Occlusion and stenosis of bilateral carotid arteries: Secondary | ICD-10-CM | POA: Diagnosis not present

## 2016-11-01 DIAGNOSIS — Z95828 Presence of other vascular implants and grafts: Secondary | ICD-10-CM | POA: Diagnosis not present

## 2016-11-01 LAB — VAS US CAROTID
LEFT ECA DIAS: -8 cm/s
LEFT VERTEBRAL DIAS: 13 cm/s
LICADSYS: -111 cm/s
LICAPDIAS: -69 cm/s
Left CCA dist dias: -17 cm/s
Left CCA dist sys: -78 cm/s
Left CCA prox dias: 11 cm/s
Left CCA prox sys: 56 cm/s
Left ICA dist dias: -34 cm/s
Left ICA prox sys: -256 cm/s
RCCADSYS: -101 cm/s
RIGHT CCA MID DIAS: 11 cm/s
RIGHT ECA DIAS: -5 cm/s
RIGHT VERTEBRAL DIAS: 16 cm/s
Right CCA prox dias: 11 cm/s
Right CCA prox sys: 85 cm/s

## 2016-11-01 NOTE — Progress Notes (Signed)
Patient name: [name] MRN: [id number] DOB: [birth year] Sex: female    HPI: [name] is a 76 y.o. female,  who is s/p left femoropopliteal artery bypass graft using saphenous vein on 10/12/2012, left superficial femoral artery stent 03/11/2005, left popliteal artery stent 04/30/2009, and right superficial femoral artery stent on 10/17/2009. She is also being monitored for carotid artery disease.  She reports no amauroses, no weakness in her extremities, and no rest pain.  She continues to have symptoms of claudication when she walks more than 1/4 of a mile.  She confesses that she still smokes on a daily basis and does not walk for exercise.  Over all she has no change in her symptoms and no history of non healing ulcers.   Other medical problems include: Dm, hyperlipidemia managed with a Statin, plavix and daily aspirin.    Past Medical History:  Diagnosis Date  . Carotid artery occlusion   . Coronary artery disease   . Diabetes mellitus    borderline , no longer treated /w medicine   . Hyperlipidemia   . Hypertension   . Peripheral vascular disease (HCC)    emboli of left foot from left superficial femoral and popliteal occulsive disease  . Stroke [facility]) 2008   Past Surgical History:  Procedure Laterality Date  . ABDOMINAL AORTAGRAM N/A 10/03/2012   Procedure: ABDOMINAL Ronny Flurry;  Surgeon: Nada Libman, MD;  Location: [facility] CATH LAB;  Service: Cardiovascular;  Laterality: N/A;  . FEMORAL-POPLITEAL BYPASS GRAFT Left 10/12/2012   Procedure: BYPASS GRAFT FEMORAL-POPLITEAL ARTERY;  Surgeon: Pryor Ochoa, MD;  Location: [facility] OR;  Service: Vascular;  Laterality: Left;  . INGUINAL HERNIA REPAIR     right  . left popliteal stent   04-30-2009  . LOWER EXTREMITY ANGIOGRAM Bilateral 10/03/2012   Procedure: LOWER EXTREMITY ANGIOGRAM;  Surgeon: Nada Libman, MD;  Location: [facility] CATH LAB;  Service: Cardiovascular;  Laterality: Bilateral;  . right SFA stent   2010  . SFA STENT   04-2005   LEFT  . TUBAL LIGATION      Family History  Problem Relation Age of Onset  . Coronary artery disease Mother   . Heart disease Mother        After age 80  . Heart attack Mother   . Coronary artery disease Sister   . Heart disease Sister        Before  age 30 -  Heart Transplant  . Cancer Sister        Kidney  . Heart attack Sister   . Heart disease Father        After age 41  . Other Father        amputation    SOCIAL HISTORY: Social History   Social History  . Marital status: Married    Spouse name: N/A  . Number of children: N/A  . Years of education: N/A   Occupational History  . Not on file.   Social History Main Topics  . Smoking status: Light Tobacco Smoker    Packs/day: 0.25    Years: 50.00    Types: Cigarettes  . Smokeless tobacco: Never Used     Comment: pt states she smokes about 4-5 cigs per day and is trying to quit  . Alcohol use No  . Drug use: No  . Sexual activity: Not on file   Other Topics Concern  . Not on file   Social History Narrative  .  No narrative on file    No Known Allergies  Current Outpatient Prescriptions  Medication Sig Dispense Refill  . Ascorbic Acid (VITAMIN C) 1000 MG tablet Take 1,000 mg by mouth 2 (two) times daily.    Marland Kitchen aspirin 81 MG tablet Take 81 mg by mouth daily.    . clopidogrel (PLAVIX) 75 MG tablet Take 75 mg by mouth daily.    Marland Kitchen ibuprofen (ADVIL) 200 MG tablet Take 400 mg by mouth daily as needed for pain.    Marland Kitchen losartan (COZAAR) 50 MG tablet Take 50 mg by mouth Daily.    . metFORMIN (GLUCOPHAGE-XR) 500 MG 24 hr tablet Take 500 mg by mouth Nightly.     . OMEGA-3-ACID ETHYL ESTERS PO Take by mouth.    . simvastatin (ZOCOR) 80 MG tablet Take 80 mg by mouth at bedtime.     No current facility-administered medications for this visit.     ROS:   General:  No weight loss, Fever, chills  HEENT: No recent headaches, no nasal bleeding, no visual changes, no sore throat  Neurologic: No dizziness,  blackouts, seizures. No recent symptoms of stroke or mini- stroke. No recent episodes of slurred speech, or temporary blindness.  Cardiac: No recent episodes of chest pain/pressure, no shortness of breath at rest.  No shortness of breath with exertion.  Denies history of atrial fibrillation or irregular heartbeat  Vascular: No history of rest pain in feet.  No history of claudication.  No history of non-healing ulcer, No history of DVT   Pulmonary: No home oxygen, no productive cough, no hemoptysis,  No asthma or wheezing  Musculoskeletal:  [ ]  Arthritis, [ ]  Low back pain,  [ ]  Joint pain  Hematologic:No history of hypercoagulable state.  No history of easy bleeding.  No history of anemia  Gastrointestinal: No hematochezia or melena,  No gastroesophageal reflux, no trouble swallowing  Urinary: [ ]  chronic Kidney disease, [ ]  on HD - [ ]  MWF or [ ]  TTHS, [ ]  Burning with urination, [ ]  Frequent urination, [ ]  Difficulty urinating;   Skin: No rashes  Psychological: No history of anxiety,  No history of depression   Physical Examination  Vitals:   11/01/16 1253 11/01/16 1255 11/01/16 1258  BP: (!) 153/73 135/68 (!) 150/76  Pulse: 76 78 76  Resp:  16   Temp:  97.6 F (36.4 C)   TempSrc:  Oral   SpO2:  96%   Weight:  156 lb (70.8 kg)   Height:  5' 3.5" (1.613 m)     Body mass index is 27.2 kg/m.  General:  Alert and oriented, no acute distress HEENT: Normal Neck: No bruit or JVD Pulmonary: Clear to auscultation bilaterally Cardiac: Regular Rate and Rhythm without murmur Abdomen: Soft, non-tender, non-distended, no mass, no scars Skin: No rash Extremity Pulses:  2+ radial, brachial, femoral, dorsalis pedis, posterior tibial pulses bilaterally Musculoskeletal: No deformity or edema  Neurologic: Upper and lower extremity motor 5/5 and symmetric  DATA:  ABI Right 0.64 , now 0.45 Left 0.99 , now 0.89 Arterial duplex Left  by pass graft mild stenosis < 50% distal  anastomosis   Right 50-99 stenosis SFA stent   Carotid duplex Right ICA: 1-39% stenosis Left ICA: 60-79% stenosis. No change from previous study  ASSESSMENT:   PAD Carotid stenosis  Based on today studies she has increased stenosis in B LE.  Her symptoms of claudication have not changed and she is able to perform ADLs on  a daily basis.  She has no history of non healing ulcers.  She reports no symptoms of stroke.  We dis review them today and discussed she should stop smoking and start a walking program.  At this point we will have her f/u in 6 months for repeat ABI's and arterial by pass duplex, and repeat Carotid duplex.      COLLINS, EMMA MAUREEN PA-C [facility]  The patient was seen in conjunction with Dr. Myra GianottiBrabham today  I agree with the above.  The patient has undergone multiple procedures in the past.  She now has a bypass graft on the left which is a below knee femoropopliteal with vein.  She has had a slight decrease in her vascular lab studies today however she is not to the point where her symptoms affect her daily living.  We will continue with observation.  She'll follow-up in 6 months for repeat carotid and lower extremity studies  [name]

## 2016-11-10 NOTE — Addendum Note (Signed)
Addended by: Burton ApleyPETTY, Savhanna Sliva A on: 11/10/2016 04:46 PM   Modules accepted: Orders

## 2017-05-09 ENCOUNTER — Other Ambulatory Visit: Payer: Self-pay

## 2017-05-09 ENCOUNTER — Encounter: Payer: Self-pay | Admitting: Surgery

## 2017-05-09 ENCOUNTER — Ambulatory Visit (HOSPITAL_COMMUNITY)
Admission: RE | Admit: 2017-05-09 | Discharge: 2017-05-09 | Disposition: A | Discharge status: Home / Self Care | Payer: Medicare Other | Source: Ambulatory Visit | Attending: Surgery | Admitting: Surgery

## 2017-05-09 ENCOUNTER — Other Ambulatory Visit: Payer: Self-pay | Admitting: Surgery

## 2017-05-09 ENCOUNTER — Ambulatory Visit (INDEPENDENT_AMBULATORY_CARE_PROVIDER_SITE_OTHER)
Admission: RE | Admit: 2017-05-09 | Discharge: 2017-05-09 | Disposition: A | Discharge status: Home / Self Care | Payer: Medicare Other | Source: Ambulatory Visit | Attending: Surgery | Admitting: Surgery

## 2017-05-09 ENCOUNTER — Ambulatory Visit (INDEPENDENT_AMBULATORY_CARE_PROVIDER_SITE_OTHER): Payer: Medicare Other | Admitting: Surgery

## 2017-05-09 ENCOUNTER — Ambulatory Visit: Payer: Medicare Other | Admitting: Surgery

## 2017-05-09 VITALS — BP 147/75 | HR 66 | Temp 97.1°F | Resp 16 | Ht 62.0 in | Wt 154.0 lb

## 2017-05-09 DIAGNOSIS — Z95828 Presence of other vascular implants and grafts: Secondary | ICD-10-CM | POA: Insufficient documentation

## 2017-05-09 DIAGNOSIS — I6522 Occlusion and stenosis of left carotid artery: Secondary | ICD-10-CM | POA: Diagnosis not present

## 2017-05-09 DIAGNOSIS — I6523 Occlusion and stenosis of bilateral carotid arteries: Secondary | ICD-10-CM

## 2017-05-09 DIAGNOSIS — I739 Peripheral vascular disease, unspecified: Secondary | ICD-10-CM

## 2017-05-09 DIAGNOSIS — F17200 Nicotine dependence, unspecified, uncomplicated: Secondary | ICD-10-CM | POA: Insufficient documentation

## 2017-05-09 DIAGNOSIS — E785 Hyperlipidemia, unspecified: Secondary | ICD-10-CM | POA: Diagnosis not present

## 2017-05-09 DIAGNOSIS — I10 Essential (primary) hypertension: Secondary | ICD-10-CM | POA: Insufficient documentation

## 2017-05-09 DIAGNOSIS — E119 Type 2 diabetes mellitus without complications: Secondary | ICD-10-CM | POA: Insufficient documentation

## 2017-05-09 NOTE — Progress Notes (Signed)
[facility] [place]  Patient name: [name] MRN: [id number] DOB: 08-08-1940 Sex: female   REASON FOR VISIT:    Follow up  HISOTRY OF PRESENT ILLNESS:    [name] is a 77 y.o. female,  who is s/p left femoropopliteal artery bypass graft using saphenous vein on 10/12/2012, left superficial femoral artery stent 03/11/2005, left popliteal artery stent 04/30/2009, and right superficial femoral artery stent on 10/17/2009.  She states that she has not had any change in her claudication symptoms, which are not lifestyle limiting.  She does not have any nonhealing wounds.  She denies any neurologic symptoms.  Specifically, she denies numbness or weakness in either extremity.  She denies slurred speech.  She denies amaurosis fugax.   PAST MEDICAL HISTORY:   Past Medical History:  Diagnosis Date  . Carotid artery occlusion   . Coronary artery disease   . Diabetes mellitus    borderline , no longer treated /w medicine   . Hyperlipidemia   . Hypertension   . Peripheral vascular disease (HCC)    emboli of left foot from left superficial femoral and popliteal occulsive disease  . Stroke [facility]) 2008     FAMILY HISTORY:   Family History  Problem Relation Age of Onset  . Coronary artery disease Mother   . Heart disease Mother        After age 32  . Heart attack Mother   . Coronary artery disease Sister   . Heart disease Sister        Before  age 12 -  Heart Transplant  . Cancer Sister        Kidney  . Heart attack Sister   . Heart disease Father        After age 26  . Other Father        amputation    SOCIAL HISTORY:   Social History   Tobacco Use  . Smoking status: Light Tobacco Smoker    Packs/day: 0.25    Years: 50.00    Pack years: 12.50    Types: Cigarettes  . Smokeless tobacco: Never Used  . Tobacco comment: pt states she smokes about 4-5 cigs per day and is trying to quit  Substance Use Topics   . Alcohol use: No     ALLERGIES:   No Known Allergies   CURRENT MEDICATIONS:   Current Outpatient Medications  Medication Sig Dispense Refill  . Ascorbic Acid (VITAMIN C) 1000 MG tablet Take 1,000 mg by mouth 2 (two) times daily.    Marland Kitchen aspirin 81 MG tablet Take 81 mg by mouth daily.    . clopidogrel (PLAVIX) 75 MG tablet Take 75 mg by mouth daily.    Marland Kitchen ibuprofen (ADVIL) 200 MG tablet Take 400 mg by mouth daily as needed for pain.    Marland Kitchen losartan (COZAAR) 50 MG tablet Take 50 mg by mouth Daily.    . metFORMIN (GLUCOPHAGE-XR) 500 MG 24 hr tablet Take 500 mg by mouth Nightly.     . OMEGA-3-ACID ETHYL ESTERS PO Take by mouth.    . simvastatin (ZOCOR) 80 MG tablet Take 80 mg by mouth at bedtime.     No current facility-administered medications for this visit.     REVIEW OF SYSTEMS:    denotes positive finding,  denotes negative finding Cardiac  Comments:  Chest pain or chest pressure:    Shortness of breath upon exertion:    Short of breath when lying  flat:    Irregular heart rhythm:        Vascular    Pain in calf, thigh, or hip brought on by ambulation: x   Pain in feet at night that wakes you up from your sleep:     Blood clot in your veins:    Leg swelling:         Pulmonary    Oxygen at home:    Productive cough:     Wheezing:         Neurologic    Sudden weakness in arms or legs:     Sudden numbness in arms or legs:     Sudden onset of difficulty speaking or slurred speech:    Temporary loss of vision in one eye:     Problems with dizziness:         Gastrointestinal    Blood in stool:     Vomited blood:         Genitourinary    Burning when urinating:     Blood in urine:        Psychiatric    Major depression:         Hematologic    Bleeding problems:    Problems with blood clotting too easily:        Skin    Rashes or ulcers:        Constitutional    Fever or chills:      PHYSICAL EXAM:   There were no vitals filed for this  visit.  GENERAL: The patient is a well-nourished female, in no acute distress. The vital signs are documented above. CARDIAC: There is a regular rate and rhythm.  VASCULAR: No carotid bruits PULMONARY: Non-labored respirations MUSCULOSKELETAL: There are no major deformities or cyanosis. NEUROLOGIC: No focal weakness or paresthesias are detected. SKIN: There are no ulcers or rashes noted. PSYCHIATRIC: The patient has a normal affect.  STUDIES:   I have ordered and reviewed the following studies Carotid duplex: 1-39 percent right, 60-79% left  Lower extremity: Left leg bypass is widely patent. ABI: Right = 0.62 (0.45)        Left = 0.9 (0.89)  MEDICAL ISSUES:   Carotid disease: The patient remains asymptomatic.  She has a 60 to 79% stenosis on the left.  I will continue to follow this on a biannual basis.  She will follow-up with a carotid study in 6 months  Lower extremity: Disease remains stable.  Her bypass remains widely patent.  She will need to follow-up in 1 year.  The studies will need to be ordered at her visit in 6 months    [name], MD [facility] [phone] Pager [phone]

## 2017-05-09 NOTE — Progress Notes (Signed)
Vitals:   05/09/17 1032 05/09/17 1036  BP: (!) 156/76 (!) 146/71  Pulse: 66 66  Resp: 16   Temp: (!) 97.1 F (36.2 C)   TempSrc: Oral   SpO2: 97%   Weight: 154 lb (69.9 kg)   Height:  (1.575 m)

## 2017-06-17 ENCOUNTER — Other Ambulatory Visit: Payer: Self-pay | Admitting: Surgery

## 2017-06-17 DIAGNOSIS — I6522 Occlusion and stenosis of left carotid artery: Secondary | ICD-10-CM

## 2017-11-04 ENCOUNTER — Ambulatory Visit (INDEPENDENT_AMBULATORY_CARE_PROVIDER_SITE_OTHER): Payer: Medicare Other | Admitting: Family

## 2017-11-04 ENCOUNTER — Other Ambulatory Visit: Payer: Self-pay

## 2017-11-04 ENCOUNTER — Ambulatory Visit (HOSPITAL_COMMUNITY)
Admission: RE | Admit: 2017-11-04 | Discharge: 2017-11-04 | Disposition: A | Discharge status: Home / Self Care | Payer: Medicare Other | Source: Ambulatory Visit | Attending: Family | Admitting: Family

## 2017-11-04 ENCOUNTER — Encounter: Payer: Self-pay | Admitting: Family

## 2017-11-04 VITALS — BP 140/77 | HR 68 | Temp 97.4°F | Resp 14 | Ht 63.0 in | Wt 152.0 lb

## 2017-11-04 DIAGNOSIS — I6523 Occlusion and stenosis of bilateral carotid arteries: Secondary | ICD-10-CM

## 2017-11-04 DIAGNOSIS — I739 Peripheral vascular disease, unspecified: Secondary | ICD-10-CM | POA: Diagnosis not present

## 2017-11-04 DIAGNOSIS — I6522 Occlusion and stenosis of left carotid artery: Secondary | ICD-10-CM

## 2017-11-04 DIAGNOSIS — F17200 Nicotine dependence, unspecified, uncomplicated: Secondary | ICD-10-CM

## 2017-11-04 DIAGNOSIS — Z95828 Presence of other vascular implants and grafts: Secondary | ICD-10-CM

## 2017-11-04 DIAGNOSIS — I499 Cardiac arrhythmia, unspecified: Secondary | ICD-10-CM

## 2017-11-04 NOTE — Patient Instructions (Signed)
Steps to Quit Smoking Smoking tobacco can be bad for your health. It can also affect almost every organ in your body. Smoking puts you and people around you at risk for many serious long-lasting (chronic) diseases. Quitting smoking is hard, but it is one of the best things that you can do for your health. It is never too late to quit. What are the benefits of quitting smoking? When you quit smoking, you lower your risk for getting serious diseases and conditions. They can include:  Lung cancer or lung disease.  Heart disease.  Stroke.  Heart attack.  Not being able to have children (infertility).  Weak bones (osteoporosis) and broken bones (fractures).  If you have coughing, wheezing, and shortness of breath, those symptoms may get better when you quit. You may also get sick less often. If you are pregnant, quitting smoking can help to lower your chances of having a baby of low birth weight. What can I do to help me quit smoking? Talk with your doctor about what can help you quit smoking. Some things you can do (strategies) include:  Quitting smoking totally, instead of slowly cutting back how much you smoke over a period of time.  Going to in-person counseling. You are more likely to quit if you go to many counseling sessions.  Using resources and support systems, such as: ? Online chats with a counselor. ? Phone quitlines. ? Printed self-help materials. ? Support groups or group counseling. ? Text messaging programs. ? Mobile phone apps or applications.  Taking medicines. Some of these medicines may have nicotine in them. If you are pregnant or breastfeeding, do not take any medicines to quit smoking unless your doctor says it is okay. Talk with your doctor about counseling or other things that can help you.  Talk with your doctor about using more than one strategy at the same time, such as taking medicines while you are also going to in-person counseling. This can help make  quitting easier. What things can I do to make it easier to quit? Quitting smoking might feel very hard at first, but there is a lot that you can do to make it easier. Take these steps:  Talk to your family and friends. Ask them to support and encourage you.  Call phone quitlines, reach out to support groups, or work with a counselor.  Ask people who smoke to not smoke around you.  Avoid places that make you want (trigger) to smoke, such as: ? Bars. ? Parties. ? Smoke-break areas at work.  Spend time with people who do not smoke.  Lower the stress in your life. Stress can make you want to smoke. Try these things to help your stress: ? Getting regular exercise. ? Deep-breathing exercises. ? Yoga. ? Meditating. ? Doing a body scan. To do this, close your eyes, focus on one area of your body at a time from head to toe, and notice which parts of your body are tense. Try to relax the muscles in those areas.  Download or buy apps on your mobile phone or tablet that can help you stick to your quit plan. There are many free apps, such as QuitGuide from the CDC (Centers for Disease Control and Prevention). You can find more support from smokefree.gov and other websites.  This information is not intended to replace advice given to you by your health care provider. Make sure you discuss any questions you have with your health care provider. Document Released: 10/17/2008 Document   Revised: 08/19/2015 Document Reviewed: 05/07/2014 Elsevier Interactive Patient Education  2018 Elsevier Inc.   Stroke Prevention Some health problems and behaviors may make it more likely for you to have a stroke. Below are ways to lessen your risk of having a stroke.  Be active for at least 30 minutes on most or all days.  Do not smoke. Try not to be around others who smoke.  Do not drink too much alcohol. ? Do not have more than 2 drinks a day if you are a man. ? Do not have more than 1 drink a day if you are a  woman and are not pregnant.  Eat healthy foods, such as fruits and vegetables. If you were put on a specific diet, follow the diet as told.  Keep your cholesterol levels under control through diet and medicines. Look for foods that are low in saturated fat, trans fat, cholesterol, and are high in fiber.  If you have diabetes, follow all diet plans and take your medicine as told.  Ask your doctor if you need treatment to lower your blood pressure. If you have high blood pressure (hypertension), follow all diet plans and take your medicine as told by your doctor.  If you are 18-39 years old, have your blood pressure checked every 3-5 years. If you are age 40 or older, have your blood pressure checked every year.  Keep a healthy weight. Eat foods that are low in calories, salt, saturated fat, trans fat, and cholesterol.  Do not take drugs.  Avoid birth control pills, if this applies. Talk to your doctor about the risks of taking birth control pills.  Talk to your doctor if you have sleep problems (sleep apnea).  Take all medicine as told by your doctor. ? You may be told to take aspirin or blood thinner medicine. Take this medicine as told by your doctor. ? Understand your medicine instructions.  Make sure any other conditions you have are being taken care of.  Get help right away if:  You suddenly lose feeling (you feel numb) or have weakness in your face, arm, or leg.  Your face or eyelid hangs down to one side.  You suddenly feel confused.  You have trouble talking (aphasia) or understanding what people are saying.  You suddenly have trouble seeing in one or both eyes.  You suddenly have trouble walking.  You are dizzy.  You lose your balance or your movements are clumsy (uncoordinated).  You suddenly have a very bad headache and you do not know the cause.  You have new chest pain.  Your heart feels like it is fluttering or skipping a beat (irregular heartbeat). Do  not wait to see if the symptoms above go away. Get help right away. Call your local emergency services (911 in [place] Do not drive yourself to the hospital. This information is not intended to replace advice given to you by your health care provider. Make sure you discuss any questions you have with your health care provider. Document Released: 06/22/2011 Document Revised: 05/29/2015 Document Reviewed: 06/23/2012 Elsevier Interactive Patient Education  2018 Elsevier Inc.     Peripheral Vascular Disease Peripheral vascular disease (PVD) is a disease of the blood vessels that are not part of your heart and brain. A simple term for PVD is poor circulation. In most cases, PVD narrows the blood vessels that carry blood from your heart to the rest of your body. This can result in a decreased supply of blood to   your arms, legs, and internal organs, like your stomach or kidneys. However, it most often affects a person's lower legs and feet. There are two types of PVD.  Organic PVD. This is the more common type. It is caused by damage to the structure of blood vessels.  Functional PVD. This is caused by conditions that make blood vessels contract and tighten (spasm).  Without treatment, PVD tends to get worse over time. PVD can also lead to acute ischemic limb. This is when an arm or limb suddenly has trouble getting enough blood. This is a medical emergency. Follow these instructions at home:  Take medicines only as told by your doctor.  Do not use any tobacco products, including cigarettes, chewing tobacco, or electronic cigarettes. If you need help quitting, ask your doctor.  Lose weight if you are overweight, and maintain a healthy weight as told by your doctor.  Eat a diet that is low in fat and cholesterol. If you need help, ask your doctor.  Exercise regularly. Ask your doctor for some good activities for you.  Take good care of your feet. ? Wear comfortable shoes that fit  well. ? Check your feet often for any cuts or sores. Contact a doctor if:  You have cramps in your legs while walking.  You have leg pain when you are at rest.  You have coldness in a leg or foot.  Your skin changes.  You are unable to get or have an erection (erectile dysfunction).  You have cuts or sores on your feet that are not healing. Get help right away if:  Your arm or leg turns cold and blue.  Your arms or legs become red, warm, swollen, painful, or numb.  You have chest pain or trouble breathing.  You suddenly have weakness in your face, arm, or leg.  You become very confused or you cannot speak.  You suddenly have a very bad headache.  You suddenly cannot see. This information is not intended to replace advice given to you by your health care provider. Make sure you discuss any questions you have with your health care provider. Document Released: 03/17/2009 Document Revised: 05/29/2015 Document Reviewed: 05/31/2013 Elsevier Interactive Patient Education  2017 Elsevier Inc.  

## 2017-11-04 NOTE — Progress Notes (Signed)
[facility] [place] HISTORY AND PHYSICAL   CC: Follow up extracranial carotid artery stenosis and peripheral artery occlusive disease    History of Present Illness:   [name] is a 77 y.o. female who is s/p left femoropopliteal artery bypass graft using saphenous vein on 10/12/2012, left superficial femoral artery stent 03/11/2005, left popliteal artery stent 04/30/2009, and right superficial femoral artery stent on 10/17/2009 by Dr. Hart Rochester.   She is also being monitored for carotid artery disease. In 2008 she had a TIA as manifested by sudden loss of dexterity of her right hand, denies speech or vision difficulties at that time. She denies any subsequent neurological event.   Dr. Hart Rochester last evaluated pt on 03/26/14. At that time his assessment was that there was excellent flow throughout the left leg graft with minimal elevated velocity at the distal anastomosis.  Dr. Myra Gianotti last evaluated pt on 05-09-17. At that time the patient remained asymptomatic of her carotid artery stenosis.  She had a 60 to 79% stenosis on the left. She was to follow-up with a carotid study in 6 months Lower extremity disease remained stable.  Her bypass remained widely patent.  She was to follow-up in 1 year.  The studies will need to be ordered at her visit in 6 months.  She returns today for follow up. After walking about 500 feet, her left calf hurts, relieved by rest. She denies non healing wounds.  She denies feeling light headed, denies dyspnea, denies chest pain, states she feels no more tired than usual. Her cardiac rhythm is irregular now.   Diabetic: yes, states her last A1C was 7.0 Tobacco use: smoker (1/4 ppd, started in her 20's)  Pt meds include: Statin :Yes ASA: Yes Other anticoagulants/antiplatelets: Plavix   Current Outpatient Medications  Medication Sig Dispense Refill  . Ascorbic Acid (VITAMIN C) 1000 MG tablet Take 1,000 mg by mouth 2 (two) times  daily.    Marland Kitchen aspirin 81 MG tablet Take 81 mg by mouth daily.    . clopidogrel (PLAVIX) 75 MG tablet Take 75 mg by mouth daily.    Marland Kitchen ibuprofen (ADVIL) 200 MG tablet Take 400 mg by mouth daily as needed for pain.    Marland Kitchen losartan (COZAAR) 50 MG tablet Take 50 mg by mouth Daily.    . metFORMIN (GLUCOPHAGE-XR) 500 MG 24 hr tablet Take 500 mg by mouth Nightly.     . OMEGA-3-ACID ETHYL ESTERS PO Take by mouth.    . simvastatin (ZOCOR) 80 MG tablet Take 80 mg by mouth at bedtime.     No current facility-administered medications for this visit.     Past Medical History:  Diagnosis Date  . Carotid artery occlusion   . Coronary artery disease   . Diabetes mellitus    borderline , no longer treated /w medicine   . Hyperlipidemia   . Hypertension   . Peripheral vascular disease (HCC)    emboli of left foot from left superficial femoral and popliteal occulsive disease  . Stroke [facility]) 2008    Social History Social History   Tobacco Use  . Smoking status: Light Tobacco Smoker    Packs/day: 0.25    Years: 50.00    Pack years: 12.50    Types: Cigarettes  . Smokeless tobacco: Never Used  . Tobacco comment: pt states she smokes about 4-5 cigs per day and is trying to quit  Substance Use Topics  . Alcohol use: No  . Drug use: No  Family History Family History  Problem Relation Age of Onset  . Coronary artery disease Mother   . Heart disease Mother        After age 24  . Heart attack Mother   . Coronary artery disease Sister   . Heart disease Sister        Before  age 18 -  Heart Transplant  . Cancer Sister        Kidney  . Heart attack Sister   . Heart disease Father        After age 20  . Other Father        amputation    Surgical History Past Surgical History:  Procedure Laterality Date  . ABDOMINAL AORTAGRAM N/A 10/03/2012   Procedure: ABDOMINAL Ronny Flurry;  Surgeon: Nada Libman, MD;  Location[facility] CATH LAB;  Service: Cardiovascular;  Laterality: N/A;  .  FEMORAL-POPLITEAL BYPASS GRAFT Left 10/12/2012   Procedure: BYPASS GRAFT FEMORAL-POPLITEAL ARTERY;  Surgeon: Pryor Ochoa, MD;  Location[facility] OR;  Service: Vascular;  Laterality: Left;  . INGUINAL HERNIA REPAIR     right  . left popliteal stent   04-30-2009  . LOWER EXTREMITY ANGIOGRAM Bilateral 10/03/2012   Procedure: LOWER EXTREMITY ANGIOGRAM;  Surgeon: Nada Libman, MD;  Location: [facility] CATH LAB;  Service: Cardiovascular;  Laterality: Bilateral;  . right SFA stent   2010  . SFA STENT  04-2005   LEFT  . TUBAL LIGATION      No Known Allergies  Current Outpatient Medications  Medication Sig Dispense Refill  . Ascorbic Acid (VITAMIN C) 1000 MG tablet Take 1,000 mg by mouth 2 (two) times daily.    Marland Kitchen aspirin 81 MG tablet Take 81 mg by mouth daily.    . clopidogrel (PLAVIX) 75 MG tablet Take 75 mg by mouth daily.    Marland Kitchen ibuprofen (ADVIL) 200 MG tablet Take 400 mg by mouth daily as needed for pain.    Marland Kitchen losartan (COZAAR) 50 MG tablet Take 50 mg by mouth Daily.    . metFORMIN (GLUCOPHAGE-XR) 500 MG 24 hr tablet Take 500 mg by mouth Nightly.     . OMEGA-3-ACID ETHYL ESTERS PO Take by mouth.    . simvastatin (ZOCOR) 80 MG tablet Take 80 mg by mouth at bedtime.     No current facility-administered medications for this visit.      REVIEW OF SYSTEMS: See HPI for pertinent positives and negatives.  Physical Examination Vitals:   11/04/17 1006 11/04/17 1010  BP: 138/73 140/77  Pulse: 70 68  Resp: 14   Temp: (!) 97.4 F (36.3 C)   TempSrc: Oral   SpO2: 94%   Weight: 152 lb (68.9 kg)   Height: 5\' 3"  (1.6 m)    Body mass index is 26.93 kg/m.  General:  WDWN in NAD Gait: Normal HENT: WNL Eyes: Pupils equal Pulmonary: non-labored breathing, some use of accessory muscles, limited air movement in all fields, no rales, rhonchi, or wheezes. Occasional moist cough, husky voice.  Cardiac: Irregular rhythm, no murmur detected Abdomen: soft, NT, no masses palpated Skin: no rashes, no  ulcers, no cellulitis.   VASCULAR EXAM  Carotid Bruits Right Left   Negative Negative      Radial pulses are 2+ palpable bilaterally   Adominal aortic pulse is faintly palpable                      VASCULAR EXAM: Extremities without ischemic changes, without Gangrene; without open  wounds.                                                                                                          LE Pulses Right Left       FEMORAL  2+ palpable  2+ palpable        POPLITEAL  not palpable   not palpable       POSTERIOR TIBIAL  not palpable   not palpable        DORSALIS PEDIS      ANTERIOR TIBIAL not palpable  not palpable     Musculoskeletal: no muscle wasting or atrophy; no peripheral edema  Neurologic:  A&O X 3; appropriate affect, sensation is normal; speech is normal, CN 2-12 intact, pain and light touch intact in extremities, motor exam as listed above. Psychiatric: Normal thought content, mood appropriate to clinical situation.    ASSESSMENT:  [name] is a 77 y.o. female who is s/p left femoropopliteal artery bypass graft using saphenous vein on 10/12/2012, left superficial femoral artery stent 03/11/2005, left popliteal artery stent 04/30/2009, and right superficial femoral artery stent on 10/17/2009.  Pt states that Dr. Myra Gianotti placed one of the stents in her legs.   She is also being monitored for carotid artery disease.  She had a TIA in 2008 as manifested by weakness in right hand which has resolved, no further TIA or stroke activity.    Pt has no known history of cardiac arrhythmia, her cardiac rhythm is irregular now, but she is asymptomatic. I advised pt to discuss this with her PCP.   Her DM seems to be in fairly good control. Her primary atherosclerotic risk factor remains her smoking, see Plan.   DATA   Carotid Duplex (11-04-17): Right ICA: 1-39% stenosis Left ICA: 60-79% stenosis Bilateral vertebral artery flow is antegrade.  Bilateral  subclavian artery waveforms are normal.  No change compared to the exam on 05-09-17.    PLAN:   Based on today's exam and non-invasive vascular lab results, the patient will follow up in 6 months with the following tests: carotid duplex, ABI's, and bilateral LE arterial duplex.  Walk at least 30 minutes total daily, briskly, in a safe environment.  Over 3 minutes was spent counseling patient re smoking cessation, and patient was given several free resources re smoking cessation.  I discussed in depth with the patient the nature of atherosclerosis, and emphasized the importance of maximal medical management including strict control of blood pressure, blood glucose, and lipid levels, obtaining regular exercise, and cessation of smoking.  The patient is aware that without maximal medical management the underlying atherosclerotic disease process will progress, limiting the benefit of any interventions.  The patient was given information about stroke prevention and what symptoms should prompt the patient to seek immediate medical care.  The patient was given information about PAD including signs, symptoms, treatment, what symptoms should prompt the patient to seek immediate medical care, and risk reduction measures to take.  Thank you for allowing [place] to participate in this patient's care.  Charisse March, RN, MSN, FNP-C [facility] Office: [phone]  Clinic MD: Early on call 11/04/2017 10:18 AM

## 2018-04-20 ENCOUNTER — Other Ambulatory Visit: Payer: Self-pay

## 2018-04-20 DIAGNOSIS — I6523 Occlusion and stenosis of bilateral carotid arteries: Secondary | ICD-10-CM

## 2018-04-20 DIAGNOSIS — I739 Peripheral vascular disease, unspecified: Secondary | ICD-10-CM

## 2018-05-05 ENCOUNTER — Encounter (HOSPITAL_COMMUNITY): Payer: Medicare Other

## 2018-05-05 ENCOUNTER — Ambulatory Visit: Payer: Medicare Other | Admitting: Family

## 2018-05-05 ENCOUNTER — Other Ambulatory Visit (HOSPITAL_COMMUNITY): Payer: Medicare Other

## 2018-08-15 ENCOUNTER — Other Ambulatory Visit: Payer: Self-pay

## 2018-08-15 DIAGNOSIS — I6523 Occlusion and stenosis of bilateral carotid arteries: Secondary | ICD-10-CM

## 2018-08-15 DIAGNOSIS — I739 Peripheral vascular disease, unspecified: Secondary | ICD-10-CM

## 2018-08-18 ENCOUNTER — Ambulatory Visit (INDEPENDENT_AMBULATORY_CARE_PROVIDER_SITE_OTHER)
Admission: RE | Admit: 2018-08-18 | Discharge: 2018-08-18 | Disposition: A | Discharge status: Home / Self Care | Payer: Medicare Other | Source: Ambulatory Visit | Attending: Family | Admitting: Family

## 2018-08-18 ENCOUNTER — Encounter: Payer: Self-pay | Admitting: Family

## 2018-08-18 ENCOUNTER — Other Ambulatory Visit: Payer: Self-pay

## 2018-08-18 ENCOUNTER — Ambulatory Visit: Payer: Medicare Other | Admitting: Family

## 2018-08-18 ENCOUNTER — Telehealth: Payer: Self-pay | Admitting: *Deleted

## 2018-08-18 ENCOUNTER — Ambulatory Visit (HOSPITAL_COMMUNITY)
Admission: RE | Admit: 2018-08-18 | Discharge: 2018-08-18 | Disposition: A | Discharge status: Home / Self Care | Payer: Medicare Other | Source: Ambulatory Visit | Attending: Family | Admitting: Family

## 2018-08-18 VITALS — BP 160/72 | HR 70 | Temp 96.5°F | Resp 14 | Ht 62.5 in | Wt 153.0 lb

## 2018-08-18 DIAGNOSIS — I739 Peripheral vascular disease, unspecified: Secondary | ICD-10-CM

## 2018-08-18 DIAGNOSIS — I6523 Occlusion and stenosis of bilateral carotid arteries: Secondary | ICD-10-CM | POA: Insufficient documentation

## 2018-08-18 DIAGNOSIS — F17200 Nicotine dependence, unspecified, uncomplicated: Secondary | ICD-10-CM

## 2018-08-18 DIAGNOSIS — Z95828 Presence of other vascular implants and grafts: Secondary | ICD-10-CM

## 2018-08-18 DIAGNOSIS — I779 Disorder of arteries and arterioles, unspecified: Secondary | ICD-10-CM

## 2018-08-18 NOTE — Progress Notes (Signed)
Virtual Visit via Telephone Note  I was not able to connect with with [name] on 08/18/2018 using the [employer] by telephone after 2 attempts at [phone], and [phone]   PCP: Maximiano CossHungarland, John David, MD  Chief Complaint: follow up extracranial carotid artery stenosis and PAOD  History of Present Illness: [name] is a 78 y.o. female who is s/p left femoropopliteal artery bypass graft using saphenous vein on 10/12/2012, left superficial femoral artery stent 03/11/2005, left popliteal artery stent 04/30/2009, and right superficial femoral artery stent on 10/17/2009 by Dr. Hart RochesterLawson.   She is also being monitored for carotid artery disease. In 2008 she had a TIA as manifested by sudden loss of dexterity of her right hand, denies speech or vision difficulties at that time. She denied any subsequent neurological event at her last in person visit.   Dr. Hart RochesterLawson last evaluated pt on 03/26/14. At that time his assessment was that there was excellent flow throughout the left leg graft with minimal elevated velocity at the distal anastomosis.  Dr. Myra GianottiBrabham last evaluated pt on 05-09-17. At that time the patient remained asymptomatic of her carotid artery stenosis. She had a 60 to 79% stenosis on the left. She was to follow-up with a carotid study in 6 months Lower extremity disease remained stable. Her bypass remained widely patent. She was to follow-up in 1 year. The studies will need to be ordered at her visit in 6 months.  At her last in person visit with me, after walking about 500 feet, her left calf hurt, relieved by rest. She denied non healing wounds.   Diabetic: yes, states her last A1C was 7.0 Tobacco use: smoker (1/4ppd, started in her 20's)  Pt meds include: Statin :Yes ASA: Yes Other anticoagulants/antiplatelets: Plavix   Past Medical History:  Diagnosis Date  . Carotid artery occlusion   . Coronary artery disease   . Diabetes mellitus    borderline , no longer treated /w medicine   . Hyperlipidemia   . Hypertension   . Peripheral vascular disease (HCC)    emboli of left foot from left superficial femoral and popliteal occulsive disease  . Stroke [facility](HCC) 2008    Past Surgical History:  Procedure Laterality Date  . ABDOMINAL AORTAGRAM N/A 10/03/2012   Procedure: ABDOMINAL Ronny FlurryAORTAGRAM;  Surgeon: Nada LibmanVance W Brabham, MD;  [facility];  Service: Cardiovascular;  Laterality: N/A;  . FEMORAL-POPLITEAL BYPASS GRAFT Left 10/12/2012   Procedure: BYPASS GRAFT FEMORAL-POPLITEAL ARTERY;  Surgeon: Pryor OchoaJames D Lawson, MD;  [facility];  Service: Vascular;  Laterality: Left;  . INGUINAL HERNIA REPAIR     right  . left popliteal stent   04-30-2009  . LOWER EXTREMITY ANGIOGRAM Bilateral 10/03/2012   Procedure: LOWER EXTREMITY ANGIOGRAM;  Surgeon: Nada LibmanVance W Brabham, MD;  [facility];  Service: Cardiovascular;  Laterality: Bilateral;  . right SFA stent   2010  . SFA STENT  04-2005   LEFT  . TUBAL LIGATION      Current Meds  Medication Sig  . Ascorbic Acid (VITAMIN C) 1000 MG tablet Take 1,000 mg by mouth 2 (two) times daily.  Marland Kitchen. aspirin 81 MG tablet Take 81 mg by mouth daily.  . clopidogrel (PLAVIX) 75 MG tablet Take 75 mg by mouth daily.  Marland Kitchen. ibuprofen (ADVIL) 200 MG tablet Take 400 mg by mouth daily as needed for pain.  Marland Kitchen. losartan (COZAAR) 50 MG tablet Take 50 mg by mouth Daily.  . metFORMIN (GLUCOPHAGE-XR) 500  MG 24 hr tablet Take 500 mg by mouth Nightly.   . OMEGA-3-ACID ETHYL ESTERS PO Take by mouth.  . simvastatin (ZOCOR) 80 MG tablet Take 80 mg by mouth at bedtime.      Observations/Objective:  Carotid Duplex (08-18-18): Right Carotid: Velocities in the right ICA are consistent with a 1-39% stenosis. Left Carotid: Velocities in the left ICA are consistent with a 60-79% stenosis. Non-hemodynamically significant plaque noted in the CCA. Bilateral vertebral arteries are antegrade. Bilateral subclavian arteries are  multiphasic.  No change compared to the exams on 05-09-17 and 11-04-17.   Bilateral LE Arterial Duplex (08-18-18): Right Stent(s): +---------------+---+---------------+----------+--------+ Prox to Stent  48                monophasic         +---------------+---+---------------+----------+--------+ Proximal Stent 62                biphasic  dampened +---------------+---+---------------+----------+--------+ Mid Stent      32250-99% stenosis          stenotic +---------------+---+---------------+----------+--------+ Distal Stent   152               monophasicbrisk    +---------------+---+---------------+----------+--------+ Distal to Stent50                monophasic         +---------------+---+---------------+----------+--------+  Left Graft #1: +--------------------+--------+--------+--------+--------+                     PSV cm/sStenosisWaveformComments +--------------------+--------+--------+--------+--------+ Inflow              118             biphasic         +--------------------+--------+--------+--------+--------+ Proximal Anastomosis165             biphasic         +--------------------+--------+--------+--------+--------+ Proximal Graft      66              biphasic         +--------------------+--------+--------+--------+--------+ Mid Graft           76              biphasic         +--------------------+--------+--------+--------+--------+ Distal Graft        89              biphasic         +--------------------+--------+--------+--------+--------+ Distal Anastamosis  74              biphasic         +--------------------+--------+--------+--------+--------+ Outflow             75              biphasic         +--------------------+--------+--------+--------+--------+ Summary: Right: 50 - 99% mid stent stenosis, uper end of range. Left: Patent femoral to popliteal bypass graft with no  evidence for restenosis. Increased stenosis in the right stent compared ti the exam on 11-01-16.    ABI Findings (08-18-18): +---------+------------------+-----+----------+--------+ Right    Rt Pressure (mmHg)IndexWaveform  Comment  +---------+------------------+-----+----------+--------+ Brachial 180                                       +---------+------------------+-----+----------+--------+ PTA      107  0.59 monophasic         +---------+------------------+-----+----------+--------+ DP       127               0.71 monophasic         +---------+------------------+-----+----------+--------+ Great Toe84                0.47 Abnormal           +---------+------------------+-----+----------+--------+  +---------+------------------+-----+----------+-------+ Left     Lt Pressure (mmHg)IndexWaveform  Comment +---------+------------------+-----+----------+-------+ Brachial 176                                      +---------+------------------+-----+----------+-------+ PTA      164               0.91 monophasic        +---------+------------------+-----+----------+-------+ DP       189               1.05 biphasic          +---------+------------------+-----+----------+-------+ Great Toe92                0.51 Abnormal          +---------+------------------+-----+----------+-------+  +-------+-----------+-----------+------------+------------+ ABI/TBIToday's ABIToday's TBIPrevious ABIPrevious TBI +-------+-----------+-----------+------------+------------+ Right  0.71       0.47       0.62        0.40         +-------+-----------+-----------+------------+------------+ Left   1.05       0.51       0.90        0.53         +-------+-----------+-----------+------------+------------+ Summary: Right: Resting right ankle-brachial index indicates moderate right lower extremity arterial disease. The right  toe-brachial index is abnormal. Left: Resting left ankle-brachial index is within normal range. No evidence of significant left lower extremity arterial disease. The left toe-brachial index is abnormal.     Assessment and Plan: ABI's improved bilaterally: moderate disease in the right with monophasic waveforms, normal in the left with bi and monophasic waveforms.  Right LE mid stent stenosis increased since 2018; highest velocity is 322 cm/sl Carotid duplex shows stable bilateral ICA stenosis: 1-39% stenosis in the right ICA, 60-79% in the left ICA.  I was unable to reach pt after 2 attempts at the 2 different phone numbers above.     Follow Up Instructions:   Follow up in 6 months with carotid duplex, bilateral LE arterial duplex, and ABI's, see Dr. Myra GianottiBrabham, NOT NP.     Donnalee CurrySigned,   [facility] Office: [phone]  08/18/2018, 4:21 PM

## 2018-08-18 NOTE — Patient Instructions (Signed)
Steps to Quit Smoking Smoking tobacco is the leading cause of preventable death. It can affect almost every organ in the body. Smoking puts you and people around you at risk for many serious, long-lasting (chronic) diseases. Quitting smoking can be hard, but it is one of the best things that you can do for your health. It is never too late to quit. How do I get ready to quit? When you decide to quit smoking, make a plan to help you succeed. Before you quit:  Pick a date to quit. Set a date within the next 2 weeks to give you time to prepare.  Write down the reasons why you are quitting. Keep this list in places where you will see it often.  Tell your family, friends, and co-workers that you are quitting. Their support is important.  Talk with your doctor about the choices that may help you quit.  Find out if your health insurance will pay for these treatments.  Know the people, places, things, and activities that make you want to smoke (triggers). Avoid them. What first steps can I take to quit smoking?  Throw away all cigarettes at home, at work, and in your car.  Throw away the things that you use when you smoke, such as ashtrays and lighters.  Clean your car. Make sure to empty the ashtray.  Clean your home, including curtains and carpets. What can I do to help me quit smoking? Talk with your doctor about taking medicines and seeing a counselor at the same time. You are more likely to succeed when you do both.  If you are pregnant or breastfeeding, talk with your doctor about counseling or other ways to quit smoking. Do not take medicine to help you quit smoking unless your doctor tells you to do so. To quit smoking: Quit right away  Quit smoking totally, instead of slowly cutting back on how much you smoke over a period of time.  Go to counseling. You are more likely to quit if you go to counseling sessions regularly. Take medicine You may take medicines to help you quit. Some  medicines need a prescription, and some you can buy over-the-counter. Some medicines may contain a drug called nicotine to replace the nicotine in cigarettes. Medicines may:  Help you to stop having the desire to smoke (cravings).  Help to stop the problems that come when you stop smoking (withdrawal symptoms). Your doctor may ask you to use:  Nicotine patches, gum, or lozenges.  Nicotine inhalers or sprays.  Non-nicotine medicine that is taken by mouth. Find resources Find resources and other ways to help you quit smoking and remain smoke-free after you quit. These resources are most helpful when you use them often. They include:  Online chats with a counselor.  Phone quitlines.  Printed self-help materials.  Support groups or group counseling.  Text messaging programs.  Mobile phone apps. Use apps on your mobile phone or tablet that can help you stick to your quit plan. There are many free apps for mobile phones and tablets as well as websites. Examples include Quit Guide from the CDC and smokefree.gov  What things can I do to make it easier to quit?   Talk to your family and friends. Ask them to support and encourage you.  Call a phone quitline (1-800-QUIT-NOW), reach out to support groups, or work with a counselor.  Ask people who smoke to not smoke around you.  Avoid places that make you want to smoke,   such as: ? Bars. ? Parties. ? Smoke-break areas at work.  Spend time with people who do not smoke.  Lower the stress in your life. Stress can make you want to smoke. Try these things to help your stress: ? Getting regular exercise. ? Doing deep-breathing exercises. ? Doing yoga. ? Meditating. ? Doing a body scan. To do this, close your eyes, focus on one area of your body at a time from head to toe. Notice which parts of your body are tense. Try to relax the muscles in those areas. How will I feel when I quit smoking? Day 1 to 3 weeks Within the first 24 hours,  you may start to have some problems that come from quitting tobacco. These problems are very bad 2-3 days after you quit, but they do not often last for more than 2-3 weeks. You may get these symptoms:  Mood swings.  Feeling restless, nervous, angry, or annoyed.  Trouble concentrating.  Dizziness.  Strong desire for high-sugar foods and nicotine.  Weight gain.  Trouble pooping (constipation).  Feeling like you may vomit (nausea).  Coughing or a sore throat.  Changes in how the medicines that you take for other issues work in your body.  Depression.  Trouble sleeping (insomnia). Week 3 and afterward After the first 2-3 weeks of quitting, you may start to notice more positive results, such as:  Better sense of smell and taste.  Less coughing and sore throat.  Slower heart rate.  Lower blood pressure.  Clearer skin.  Better breathing.  Fewer sick days. Quitting smoking can be hard. Do not give up if you fail the first time. Some people need to try a few times before they succeed. Do your best to stick to your quit plan, and talk with your doctor if you have any questions or concerns. Summary  Smoking tobacco is the leading cause of preventable death. Quitting smoking can be hard, but it is one of the best things that you can do for your health.  When you decide to quit smoking, make a plan to help you succeed.  Quit smoking right away, not slowly over a period of time.  When you start quitting, seek help from your doctor, family, or friends. This information is not intended to replace advice given to you by your health care provider. Make sure you discuss any questions you have with your health care provider. Document Released: 10/17/2008 Document Revised: 03/10/2018 Document Reviewed: 03/11/2018 Elsevier Patient Education  2020 Elsevier Inc.     Stroke Prevention Some medical conditions and lifestyle choices can lead to a higher risk for a stroke. You can  help to prevent a stroke by making nutrition, lifestyle, and other changes. What nutrition changes can be made?   Eat healthy foods. ? Choose foods that are high in fiber. These include:  Fresh fruits.  Fresh vegetables.  Whole grains. ? Eat at least 5 or more servings of fruits and vegetables each day. Try to fill half of your plate at each meal with fruits and vegetables. ? Choose lean protein foods. These include:  Lowfat (lean) cuts of meat.  Chicken without skin.  Fish.  Tofu.  Beans.  Nuts. ? Eat low-fat dairy products. ? Avoid foods that:  Are high in salt (sodium).  Have saturated fat.  Have trans fat.  Have cholesterol.  Are processed.  Are premade.  Follow eating guidelines as told by your doctor. These may include: ? Reducing how many calories you   eat and drink each day. ? Limiting how much salt you eat or drink each day to 1,500 milligrams (mg). ? Using only healthy fats for cooking. These include:  Olive oil.  Canola oil.  Sunflower oil. ? Counting how many carbohydrates you eat and drink each day. What lifestyle changes can be made?  Try to stay at a healthy weight. Talk to your doctor about what a good weight is for you.  Get at least 30 minutes of moderate physical activity at least 5 days a week. This can include: ? Fast walking. ? Biking. ? Swimming.  Do not use any products that have nicotine or tobacco. This includes cigarettes and e-cigarettes. If you need help quitting, ask your doctor. Avoid being around tobacco smoke in general.  Limit how much alcohol you drink to no more than 1 drink a day for nonpregnant women and 2 drinks a day for men. One drink equals 12 oz of beer, 5 oz of wine, or 1 oz of hard liquor.  Do not use drugs.  Avoid taking birth control pills. Talk to your doctor about the risks of taking birth control pills if: ? You are over 35 years old. ? You smoke. ? You get migraines. ? You have had a blood clot.  What other changes can be made?  Manage your cholesterol. ? It is important to eat a healthy diet. ? If your cholesterol cannot be managed through your diet, you may also need to take medicines. Take medicines as told by your doctor.  Manage your diabetes. ? It is important to eat a healthy diet and to exercise regularly. ? If your blood sugar cannot be managed through diet and exercise, you may need to take medicines. Take medicines as told by your doctor.  Control your high blood pressure (hypertension). ? Try to keep your blood pressure below 130/80. This can help lower your risk of stroke. ? It is important to eat a healthy diet and to exercise regularly. ? If your blood pressure cannot be managed through diet and exercise, you may need to take medicines. Take medicines as told by your doctor. ? Ask your doctor if you should check your blood pressure at home. ? Have your blood pressure checked every year. Do this even if your blood pressure is normal.  Talk to your doctor about getting checked for a sleep disorder. Signs of this can include: ? Snoring a lot. ? Feeling very tired.  Take over-the-counter and prescription medicines only as told by your doctor. These may include aspirin or blood thinners (antiplatelets or anticoagulants).  Make sure that any other medical conditions you have are managed. Where to find more information  American Stroke Association: www.strokeassociation.org  National Stroke Association: www.stroke.org Get help right away if:  You have any symptoms of stroke. "BE FAST" is an easy way to remember the main warning signs: ? B - Balance. Signs are dizziness, sudden trouble walking, or loss of balance. ? E - Eyes. Signs are trouble seeing or a sudden change in how you see. ? F - Face. Signs are sudden weakness or loss of feeling of the face, or the face or eyelid drooping on one side. ? A - Arms. Signs are weakness or loss of feeling in an arm. This  happens suddenly and usually on one side of the body. ? S - Speech. Signs are sudden trouble speaking, slurred speech, or trouble understanding what people say. ? T - Time. Time to call emergency   services. Write down what time symptoms started.  You have other signs of stroke, such as: ? A sudden, very bad headache with no known cause. ? Feeling sick to your stomach (nausea). ? Throwing up (vomiting). ? Jerky movements you cannot control (seizure). These symptoms may represent a serious problem that is an emergency. Do not wait to see if the symptoms will go away. Get medical help right away. Call your local emergency services (911 in the [place] Do not drive yourself to the hospital. Summary  You can prevent a stroke by eating healthy, exercising, not smoking, drinking less alcohol, and treating other health problems, such as diabetes, high blood pressure, or high cholesterol.  Do not use any products that contain nicotine or tobacco, such as cigarettes and e-cigarettes.  Get help right away if you have any signs or symptoms of a stroke. This information is not intended to replace advice given to you by your health care provider. Make sure you discuss any questions you have with your health care provider. Document Released: 06/22/2011 Document Revised: 02/16/2018 Document Reviewed: 03/24/2016 Elsevier Patient Education  2020 Elsevier Inc.     Peripheral Vascular Disease  Peripheral vascular disease (PVD) is a disease of the blood vessels that are not part of your heart and brain. A simple term for PVD is poor circulation. In most cases, PVD narrows the blood vessels that carry blood from your heart to the rest of your body. This can reduce the supply of blood to your arms, legs, and internal organs, like your stomach or kidneys. However, PVD most often affects a person's lower legs and feet. Without treatment, PVD tends to get worse. PVD can also lead to acute ischemic limb. This is when  an arm or leg suddenly cannot get enough blood. This is a medical emergency. Follow these instructions at home: Lifestyle  Do not use any products that contain nicotine or tobacco, such as cigarettes and e-cigarettes. If you need help quitting, ask your doctor.  Lose weight if you are overweight. Or, stay at a healthy weight as told by your doctor.  Eat a diet that is low in fat and cholesterol. If you need help, ask your doctor.  Exercise regularly. Ask your doctor for activities that are right for you. General instructions  Take over-the-counter and prescription medicines only as told by your doctor.  Take good care of your feet: ? Wear comfortable shoes that fit well. ? Check your feet often for any cuts or sores.  Keep all follow-up visits as told by your doctor This is important. Contact a doctor if:  You have cramps in your legs when you walk.  You have leg pain when you are at rest.  You have coldness in a leg or foot.  Your skin changes.  You are unable to get or have an erection (erectile dysfunction).  You have cuts or sores on your feet that do not heal. Get help right away if:  Your arm or leg turns cold, numb, and blue.  Your arms or legs become red, warm, swollen, painful, or numb.  You have chest pain.  You have trouble breathing.  You suddenly have weakness in your face, arm, or leg.  You become very confused or you cannot speak.  You suddenly have a very bad headache.  You suddenly cannot see. Summary  Peripheral vascular disease (PVD) is a disease of the blood vessels.  A simple term for PVD is poor circulation. Without treatment, PVD tends   to get worse.  Treatment may include exercise, low fat and low cholesterol diet, and quitting smoking. This information is not intended to replace advice given to you by your health care provider. Make sure you discuss any questions you have with your health care provider. Document Released: 03/17/2009  Document Revised: 12/03/2016 Document Reviewed: 01/29/2016 Elsevier Patient Education  2020 Elsevier Inc.  

## 2018-08-18 NOTE — Telephone Encounter (Signed)
Virtual Visit Pre-Appointment Phone Call  Today, I spoke with [name] and performed the following actions:  1. I explained that we are currently trying to limit exposure to the COVID-19 virus by seeing patients at home rather than in the office.  I explained that the visits are best done by video, but can be done by telephone.  I asked the patient if a virtual visit that the patient would like to try instead of coming into the office. [name] agreed to proceed with the virtual visit scheduled with Vinnie Level Nickel NP on 08/18/18.      2. I confirmed the BEST phone number to call the day of the visit and- I included this in appointment notes.  3. I asked if the patient had access to (through a family member/friend) a smartphone with video capability to be used for her visit?"  The patient said yes -   4. I confirmed consent by  a. sending through [facility] or by email the [place] as written at the end of this message or  b. verbally as listed below. i. This visit is being performed in the setting of COVID-19. ii. All virtual visits are billed to your insurance company just like a normal visit would be.   iii. We'd like you to understand that the technology does not allow for your provider to perform an examination, and thus may limit your provider's ability to fully assess your condition.  iv. If your provider identifies any concerns that need to be evaluated in person, we will make arrangements to do so.   v. Finally, though the technology is pretty good, we cannot assure that it will always work on either your or our end, and in the setting of a video visit, we may have to convert it to a phone-only visit.  In either situation, we cannot ensure that we have a secure connection.   vi. Are you willing to proceed?"  STAFF: Did the patient verbally acknowledge consent to telehealth visit? Document YES/NO here: YES  2. I advised the patient to be  prepared - I asked that the patient, on the day of her visit, record any information possible with the equipment at her home, such as blood pressure, pulse, oxygen saturation, and your weight and write them all down. I asked the patient to have a pen and paper handy nearby the day of the visit as well.  3. If the patient was scheduled for a video visit, I informed the patient that the visit with the doctor would start with a text to the smartphone # given to [place] by the patient.         If the patient was scheduled for a telephone call, I informed the patient that the visit with the doctor would start with a call to the telephone # given to [place] by the patient.  4. I Informed patient they will receive a phone call 15 minutes prior to their appointment time from a [place] or nurse to review medications, allergies, etc. to prepare for the visit.    TELEPHONE CALL NOTE  [name] has been deemed a candidate for a follow-up tele-health visit to limit community exposure during the Covid-19 pandemic. I spoke with the patient via phone to ensure availability of phone/video source, confirm preferred email & phone number, and discuss instructions and expectations.  I reminded [name] to be prepared with any vital sign  and/or heart rhythm information that could potentially be obtained via home monitoring, at the time of her visit. I reminded [name] to expect a phone call prior to her visit.  Rudi CocoLathan, Rozetta Stumpp M, NT 08/18/2018 1:29 PM     FULL LENGTH CONSENT FOR TELE-HEALTH VISIT   I hereby voluntarily request, consent and authorize [facility] and its employed or contracted physicians, physician assistants, nurse practitioners or other licensed health care professionals (the Practitioner), to provide me with telemedicine health care services (the "Services") as deemed necessary by the treating Practitioner. I acknowledge and consent to receive the Services by the Practitioner via  telemedicine. I understand that the telemedicine visit will involve communicating with the Practitioner through live audiovisual communication technology and the disclosure of certain medical information by electronic transmission. I acknowledge that I have been given the opportunity to request an in-person assessment or other available alternative prior to the telemedicine visit and am voluntarily participating in the telemedicine visit.  I understand that I have the right to withhold or withdraw my consent to the use of telemedicine in the course of my care at any time, without affecting my right to future care or treatment, and that the Practitioner or I may terminate the telemedicine visit at any time. I understand that I have the right to inspect all information obtained and/or recorded in the course of the telemedicine visit and may receive copies of available information for a reasonable fee.  I understand that some of the potential risks of receiving the Services via telemedicine include:  Marland Kitchen. Delay or interruption in medical evaluation due to technological equipment failure or disruption; . Information transmitted may not be sufficient (e.g. poor resolution of images) to allow for appropriate medical decision making by the Practitioner; and/or  . In rare instances, security protocols could fail, causing a breach of personal health information.  Furthermore, I acknowledge that it is my responsibility to provide information about my medical history, conditions and care that is complete and accurate to the best of my ability. I acknowledge that Practitioner's advice, recommendations, and/or decision may be based on factors not within their control, such as incomplete or inaccurate data provided by me or distortions of diagnostic images or specimens that may result from electronic transmissions. I understand that the practice of medicine is not an exact science and that Practitioner makes no warranties or  guarantees regarding treatment outcomes. I acknowledge that I will receive a copy of this consent concurrently upon execution via email to the email address I last provided but may also request a printed copy by calling the office of [facility].    I understand that my insurance will be billed for this visit.   I have read or had this consent read to me. . I understand the contents of this consent, which adequately explains the benefits and risks of the Services being provided via telemedicine.  . I have been provided ample opportunity to ask questions regarding this consent and the Services and have had my questions answered to my satisfaction. . I give my informed consent for the services to be provided through the use of telemedicine in my medical care  By participating in this telemedicine visit I agree to the above.

## 2019-03-12 ENCOUNTER — Ambulatory Visit: Payer: Medicare Other | Admitting: Surgery

## 2019-03-12 ENCOUNTER — Encounter (HOSPITAL_COMMUNITY): Payer: Medicare Other

## 2019-03-12 ENCOUNTER — Other Ambulatory Visit (HOSPITAL_COMMUNITY): Payer: Medicare Other

## 2019-04-09 ENCOUNTER — Ambulatory Visit: Payer: Medicare Other | Admitting: Surgery

## 2019-04-18 ENCOUNTER — Other Ambulatory Visit: Payer: Self-pay | Admitting: *Deleted

## 2019-04-18 DIAGNOSIS — I6523 Occlusion and stenosis of bilateral carotid arteries: Secondary | ICD-10-CM

## 2019-04-18 DIAGNOSIS — I739 Peripheral vascular disease, unspecified: Secondary | ICD-10-CM

## 2019-04-18 DIAGNOSIS — Z95828 Presence of other vascular implants and grafts: Secondary | ICD-10-CM

## 2019-04-18 DIAGNOSIS — I779 Disorder of arteries and arterioles, unspecified: Secondary | ICD-10-CM

## 2019-04-23 ENCOUNTER — Ambulatory Visit (HOSPITAL_COMMUNITY): Payer: Medicare Other

## 2019-04-23 ENCOUNTER — Ambulatory Visit (HOSPITAL_COMMUNITY): Admission: RE | Admit: 2019-04-23 | Payer: Medicare Other | Source: Ambulatory Visit

## 2019-04-23 ENCOUNTER — Ambulatory Visit: Payer: Medicare Other | Admitting: Surgery

## 2019-05-16 ENCOUNTER — Other Ambulatory Visit: Payer: Self-pay | Admitting: *Deleted

## 2019-05-16 DIAGNOSIS — Z95828 Presence of other vascular implants and grafts: Secondary | ICD-10-CM

## 2019-05-16 DIAGNOSIS — I779 Disorder of arteries and arterioles, unspecified: Secondary | ICD-10-CM

## 2019-05-18 ENCOUNTER — Telehealth (HOSPITAL_COMMUNITY): Payer: Self-pay

## 2019-05-18 NOTE — Telephone Encounter (Signed)

## 2019-05-21 ENCOUNTER — Ambulatory Visit (HOSPITAL_COMMUNITY)
Admission: RE | Admit: 2019-05-21 | Discharge: 2019-05-21 | Disposition: A | Discharge status: Home / Self Care | Payer: Medicare Other | Source: Ambulatory Visit | Attending: Surgery | Admitting: Surgery

## 2019-05-21 ENCOUNTER — Encounter: Payer: Self-pay | Admitting: Surgery

## 2019-05-21 ENCOUNTER — Ambulatory Visit (INDEPENDENT_AMBULATORY_CARE_PROVIDER_SITE_OTHER): Payer: Medicare Other | Admitting: Surgery

## 2019-05-21 ENCOUNTER — Ambulatory Visit (INDEPENDENT_AMBULATORY_CARE_PROVIDER_SITE_OTHER)
Admission: RE | Admit: 2019-05-21 | Discharge: 2019-05-21 | Disposition: A | Discharge status: Home / Self Care | Payer: Medicare Other | Source: Ambulatory Visit | Attending: Surgery | Admitting: Surgery

## 2019-05-21 ENCOUNTER — Other Ambulatory Visit: Payer: Self-pay

## 2019-05-21 VITALS — BP 173/73 | HR 61 | Temp 97.3°F | Resp 20 | Ht 62.5 in | Wt 143.0 lb

## 2019-05-21 DIAGNOSIS — Z95828 Presence of other vascular implants and grafts: Secondary | ICD-10-CM | POA: Diagnosis present

## 2019-05-21 DIAGNOSIS — I70213 Atherosclerosis of native arteries of extremities with intermittent claudication, bilateral legs: Secondary | ICD-10-CM | POA: Diagnosis not present

## 2019-05-21 DIAGNOSIS — I779 Disorder of arteries and arterioles, unspecified: Secondary | ICD-10-CM | POA: Diagnosis present

## 2019-05-21 DIAGNOSIS — I6523 Occlusion and stenosis of bilateral carotid arteries: Secondary | ICD-10-CM

## 2019-05-21 NOTE — Progress Notes (Signed)
[facility] [place]  Patient name: [name] MRN: [id number] DOB: September 02, 1940 Sex: female   REASON FOR VISIT:    Follow up  Cataract:    [name] a 79 y.o.female,who is s/p left femoropopliteal artery bypass graft using saphenous vein on 10/12/2012, left superficial femoral artery stent 03/11/2005, left popliteal artery stent 04/30/2009, and right superficial femoral artery stent on 10/17/2009.  She was recently in the [name] in [name] for a near syncopal episode.  Her work-up did not show any obvious cause.  She does state that she was hypertensive and hyperglycemic.  The patient continues to take dual antiplatelet therapy.  She is on Zocor for hypercholesterolemia.  She takes an ARB for hypertension.  PAST MEDICAL HISTORY:   Past Medical History:  Diagnosis Date  . Carotid artery occlusion   . Coronary artery disease   . Diabetes mellitus    borderline , no longer treated /w medicine   . Hyperlipidemia   . Hypertension   . Peripheral vascular disease ([name])    emboli of left foot from left superficial femoral and popliteal occulsive disease  . Stroke [name]) 2008     FAMILY HISTORY:   Family History  Problem Relation Age of Onset  . Coronary artery disease Mother   . Heart disease Mother        After age 22  . Heart attack Mother   . Coronary artery disease Sister   . Heart disease Sister        Before  age 47 -  Heart Transplant  . Cancer Sister        Kidney  . Heart attack Sister   . Heart disease Father        After age 39  . Other Father        amputation    SOCIAL HISTORY:   Social History   Tobacco Use  . Smoking status: Light Tobacco Smoker    Packs/day: 0.25    Years: 50.00    Pack years: 12.50    Types: Cigarettes  . Smokeless tobacco: Never Used  . Tobacco comment: pt states she smokes about 4-5 cigs per day and is trying to quit  Substance  Use Topics  . Alcohol use: No     ALLERGIES:   No Known Allergies   CURRENT MEDICATIONS:   Current Outpatient Medications  Medication Sig Dispense Refill  . amLODipine (NORVASC) 5 MG tablet Take by mouth.    . Ascorbic Acid (VITAMIN C) 1000 MG tablet Take 1,000 mg by mouth 2 (two) times daily.    Marland Kitchen aspirin 81 MG tablet Take 81 mg by mouth daily.    . clopidogrel (PLAVIX) 75 MG tablet Take 75 mg by mouth daily.    Marland Kitchen ibuprofen (ADVIL) 200 MG tablet Take 400 mg by mouth daily as needed for pain.    Marland Kitchen losartan (COZAAR) 50 MG tablet Take 50 mg by mouth Daily.    . metFORMIN (GLUCOPHAGE-XR) 500 MG 24 hr tablet Take 500 mg by mouth Nightly.     . OMEGA-3-ACID ETHYL ESTERS PO Take by mouth.    . simvastatin (ZOCOR) 80 MG tablet Take 80 mg by mouth at bedtime.     No current facility-administered medications for this visit.    REVIEW OF SYSTEMS:   [X]  denotes positive finding, [ ]  denotes negative finding Cardiac  Comments:  Chest pain or chest pressure:    Shortness of breath upon  exertion:    Short of breath when lying flat:    Irregular heart rhythm:        Vascular    Pain in calf, thigh, or hip brought on by ambulation:    Pain in feet at night that wakes you up from your sleep:     Blood clot in your veins:    Leg swelling:         Pulmonary    Oxygen at home:    Productive cough:     Wheezing:         Neurologic    Sudden weakness in arms or legs:     Sudden numbness in arms or legs:     Sudden onset of difficulty speaking or slurred speech:    Temporary loss of vision in one eye:     Problems with dizziness:         Gastrointestinal    Blood in stool:     Vomited blood:         Genitourinary    Burning when urinating:     Blood in urine:        Psychiatric    Major depression:         Hematologic    Bleeding problems:    Problems with blood clotting too easily:        Skin    Rashes or ulcers:        Constitutional    Fever or chills:       PHYSICAL EXAM:   Vitals:   05/21/19 0858 05/21/19 0900  BP: (!) 151/74 (!) 173/73  Pulse: 61   Resp: 20   Temp: (!) 97.3 F (36.3 C)   SpO2: 96%   Weight: 143 lb (64.9 kg)   Height: 5' 2.5" (1.588 m)     GENERAL: The patient is a well-nourished female, in no acute distress. The vital signs are documented above. CARDIAC: There is a regular rate and rhythm.  VASCULAR: I could not palpate pedal pulses PULMONARY: Non-labored respirations ABDOMEN: Soft and non-tender with normal pitched bowel sounds.  MUSCULOSKELETAL: There are no major deformities or cyanosis. NEUROLOGIC: No focal weakness or paresthesias are detected. SKIN: There are no ulcers or rashes noted. PSYCHIATRIC: The patient has a normal affect.  STUDIES:   I have reviewed the following: ABI/TBIToday's ABIToday's TBIPrevious ABIPrevious TBI  +-------+-----------+-----------+------------+------------+  Right 0.67    0.43    0.71    0.47      +-------+-----------+-----------+------------+------------+  Left  0.83    0.00    1.05    0.51      +-------+-----------+-----------+------------+------------+   Right: 50-99% stenosis noted in the mid superficial femoral artery stent.   Left: Patent femoropopliteal bypass graft with no evidence of stenosis.   MEDICAL ISSUES:   Carotid: The patient had a CT scan during her syncopal work-up which showed 70% stenosis.  I do not think her neurologic event was related to this.  PAD: Her right ABI has decreased and she has a significant stenosis in her superficial femoral artery stent.  This will need to be evaluated with angiography which will be through a left femoral approach.  I will also need to evaluate her left leg blood flow as her ABI has also dropped on the side.  Her procedure has been scheduled for May 25.    Charlena Cross, MD, FACS [name] [phone] Pager [phone]

## 2019-05-21 NOTE — H&P (View-Only) (Signed)
 [facility] [place]  Patient name: [name] MRN: [id number] DOB: 10/07/1940 Sex: female   REASON FOR VISIT:    Follow up  HISOTRY OF PRESENT ILLNESS:    Felicia Leeis a 79 y.o.female,who is s/p left femoropopliteal artery bypass graft using saphenous vein on 10/12/2012, left superficial femoral artery stent 03/11/2005, left popliteal artery stent 04/30/2009, and right superficial femoral artery stent on 10/17/2009.  She was recently in the hospital in [place] for a near syncopal episode.  Her work-up did not show any obvious cause.  She does state that she was hypertensive and hyperglycemic.  The patient continues to take dual antiplatelet therapy.  She is on Zocor for hypercholesterolemia.  She takes an ARB for hypertension.  PAST MEDICAL HISTORY:   Past Medical History:  Diagnosis Date  . Carotid artery occlusion   . Coronary artery disease   . Diabetes mellitus    borderline , no longer treated /w medicine   . Hyperlipidemia   . Hypertension   . Peripheral vascular disease ([facility])    emboli of left foot from left superficial femoral and popliteal occulsive disease  . Stroke ([facility]) 2008     FAMILY HISTORY:   Family History  Problem Relation Age of Onset  . Coronary artery disease Mother   . Heart disease Mother        After age 60  . Heart attack Mother   . Coronary artery disease Sister   . Heart disease Sister        Before  age 60 -  Heart Transplant  . Cancer Sister        Kidney  . Heart attack Sister   . Heart disease Father        After age 60  . Other Father        amputation    SOCIAL HISTORY:   Social History   Tobacco Use  . Smoking status: Light Tobacco Smoker    Packs/day: 0.25    Years: 50.00    Pack years: 12.50    Types: Cigarettes  . Smokeless tobacco: Never Used  . Tobacco comment: pt states she smokes about 4-5 cigs per day and is trying to quit  Substance  Use Topics  . Alcohol use: No     ALLERGIES:   No Known Allergies   CURRENT MEDICATIONS:   Current Outpatient Medications  Medication Sig Dispense Refill  . amLODipine (NORVASC) 5 MG tablet Take by mouth.    . Ascorbic Acid (VITAMIN C) 1000 MG tablet Take 1,000 mg by mouth 2 (two) times daily.    . aspirin 81 MG tablet Take 81 mg by mouth daily.    . clopidogrel (PLAVIX) 75 MG tablet Take 75 mg by mouth daily.    . ibuprofen (ADVIL) 200 MG tablet Take 400 mg by mouth daily as needed for pain.    . losartan (COZAAR) 50 MG tablet Take 50 mg by mouth Daily.    . metFORMIN (GLUCOPHAGE-XR) 500 MG 24 hr tablet Take 500 mg by mouth Nightly.     . OMEGA-3-ACID ETHYL ESTERS PO Take by mouth.    . simvastatin (ZOCOR) 80 MG tablet Take 80 mg by mouth at bedtime.     No current facility-administered medications for this visit.    REVIEW OF SYSTEMS:   [X] denotes positive finding, [ ] denotes negative finding Cardiac  Comments:  Chest pain or chest pressure:    Shortness of breath upon   exertion:    Short of breath when lying flat:    Irregular heart rhythm:        Vascular    Pain in calf, thigh, or hip brought on by ambulation:    Pain in feet at night that wakes you up from your sleep:     Blood clot in your veins:    Leg swelling:         Pulmonary    Oxygen at home:    Productive cough:     Wheezing:         Neurologic    Sudden weakness in arms or legs:     Sudden numbness in arms or legs:     Sudden onset of difficulty speaking or slurred speech:    Temporary loss of vision in one eye:     Problems with dizziness:         Gastrointestinal    Blood in stool:     Vomited blood:         Genitourinary    Burning when urinating:     Blood in urine:        Psychiatric    Major depression:         Hematologic    Bleeding problems:    Problems with blood clotting too easily:        Skin    Rashes or ulcers:        Constitutional    Fever or chills:       PHYSICAL EXAM:   Vitals:   05/21/19 0858 05/21/19 0900  BP: (!) 151/74 (!) 173/73  Pulse: 61   Resp: 20   Temp: (!) 97.3 F (36.3 C)   SpO2: 96%   Weight: 143 lb (64.9 kg)   Height: 5' 2.5" (1.588 m)     GENERAL: The patient is a well-nourished female, in no acute distress. The vital signs are documented above. CARDIAC: There is a regular rate and rhythm.  VASCULAR: I could not palpate pedal pulses PULMONARY: Non-labored respirations ABDOMEN: Soft and non-tender with normal pitched bowel sounds.  MUSCULOSKELETAL: There are no major deformities or cyanosis. NEUROLOGIC: No focal weakness or paresthesias are detected. SKIN: There are no ulcers or rashes noted. PSYCHIATRIC: The patient has a normal affect.  STUDIES:   I have reviewed the following: ABI/TBIToday's ABIToday's TBIPrevious ABIPrevious TBI  +-------+-----------+-----------+------------+------------+  Right 0.67    0.43    0.71    0.47      +-------+-----------+-----------+------------+------------+  Left  0.83    0.00    1.05    0.51      +-------+-----------+-----------+------------+------------+   Right: 50-99% stenosis noted in the mid superficial femoral artery stent.   Left: Patent femoropopliteal bypass graft with no evidence of stenosis.   MEDICAL ISSUES:   Carotid: The patient had a CT scan during her syncopal work-up which showed 70% stenosis.  I do not think her neurologic event was related to this.  PAD: Her right ABI has decreased and she has a significant stenosis in her superficial femoral artery stent.  This will need to be evaluated with angiography which will be through a left femoral approach.  I will also need to evaluate her left leg blood flow as her ABI has also dropped on the side.  Her procedure has been scheduled for May 25.    Charlena Cross, MD, FACS [facility] [phone] Pager [phone]

## 2019-05-22 ENCOUNTER — Other Ambulatory Visit: Payer: Self-pay | Admitting: *Deleted

## 2019-05-22 DIAGNOSIS — I6523 Occlusion and stenosis of bilateral carotid arteries: Secondary | ICD-10-CM

## 2019-05-28 ENCOUNTER — Other Ambulatory Visit (HOSPITAL_COMMUNITY)
Admission: RE | Admit: 2019-05-28 | Discharge: 2019-05-28 | Disposition: A | Discharge status: Home / Self Care | Payer: Medicare Other | Source: Ambulatory Visit | Attending: Surgery | Admitting: Surgery

## 2019-05-28 DIAGNOSIS — Z20822 Contact with and (suspected) exposure to covid-19: Secondary | ICD-10-CM | POA: Insufficient documentation

## 2019-05-28 DIAGNOSIS — Z01812 Encounter for preprocedural laboratory examination: Secondary | ICD-10-CM | POA: Insufficient documentation

## 2019-05-28 LAB — SARS CORONAVIRUS 2 (TAT 6-24 HRS): SARS Coronavirus 2: NEGATIVE

## 2019-05-29 ENCOUNTER — Encounter (HOSPITAL_COMMUNITY)
Admission: RE | Disposition: A | Discharge status: Home / Self Care | Payer: Self-pay | Source: Home / Self Care | Attending: Surgery

## 2019-05-29 ENCOUNTER — Observation Stay (HOSPITAL_COMMUNITY)
Admission: RE | Admit: 2019-05-29 | Discharge: 2019-05-30 | Disposition: A | Discharge status: Home / Self Care | Payer: Medicare Other | Attending: Surgery | Admitting: Surgery

## 2019-05-29 ENCOUNTER — Other Ambulatory Visit: Payer: Self-pay

## 2019-05-29 DIAGNOSIS — Z8673 Personal history of transient ischemic attack (TIA), and cerebral infarction without residual deficits: Secondary | ICD-10-CM | POA: Insufficient documentation

## 2019-05-29 DIAGNOSIS — Z79899 Other long term (current) drug therapy: Secondary | ICD-10-CM | POA: Insufficient documentation

## 2019-05-29 DIAGNOSIS — I6529 Occlusion and stenosis of unspecified carotid artery: Secondary | ICD-10-CM | POA: Diagnosis not present

## 2019-05-29 DIAGNOSIS — Z7902 Long term (current) use of antithrombotics/antiplatelets: Secondary | ICD-10-CM | POA: Insufficient documentation

## 2019-05-29 DIAGNOSIS — I701 Atherosclerosis of renal artery: Secondary | ICD-10-CM | POA: Diagnosis not present

## 2019-05-29 DIAGNOSIS — T82858A Stenosis of vascular prosthetic devices, implants and grafts, initial encounter: Principal | ICD-10-CM | POA: Insufficient documentation

## 2019-05-29 DIAGNOSIS — Z7984 Long term (current) use of oral hypoglycemic drugs: Secondary | ICD-10-CM | POA: Insufficient documentation

## 2019-05-29 DIAGNOSIS — I2510 Atherosclerotic heart disease of native coronary artery without angina pectoris: Secondary | ICD-10-CM | POA: Diagnosis not present

## 2019-05-29 DIAGNOSIS — E1151 Type 2 diabetes mellitus with diabetic peripheral angiopathy without gangrene: Secondary | ICD-10-CM | POA: Insufficient documentation

## 2019-05-29 DIAGNOSIS — I10 Essential (primary) hypertension: Secondary | ICD-10-CM | POA: Diagnosis not present

## 2019-05-29 DIAGNOSIS — I70201 Unspecified atherosclerosis of native arteries of extremities, right leg: Secondary | ICD-10-CM | POA: Diagnosis not present

## 2019-05-29 DIAGNOSIS — E785 Hyperlipidemia, unspecified: Secondary | ICD-10-CM | POA: Insufficient documentation

## 2019-05-29 DIAGNOSIS — I739 Peripheral vascular disease, unspecified: Secondary | ICD-10-CM | POA: Diagnosis present

## 2019-05-29 DIAGNOSIS — E1165 Type 2 diabetes mellitus with hyperglycemia: Secondary | ICD-10-CM | POA: Diagnosis not present

## 2019-05-29 DIAGNOSIS — Z7982 Long term (current) use of aspirin: Secondary | ICD-10-CM | POA: Diagnosis not present

## 2019-05-29 DIAGNOSIS — Z8249 Family history of ischemic heart disease and other diseases of the circulatory system: Secondary | ICD-10-CM | POA: Diagnosis not present

## 2019-05-29 DIAGNOSIS — F17210 Nicotine dependence, cigarettes, uncomplicated: Secondary | ICD-10-CM | POA: Insufficient documentation

## 2019-05-29 HISTORY — PX: PERIPHERAL VASCULAR BALLOON ANGIOPLASTY: CATH118281

## 2019-05-29 HISTORY — PX: ABDOMINAL AORTOGRAM W/LOWER EXTREMITY: CATH118223

## 2019-05-29 HISTORY — PX: PERIPHERAL VASCULAR ATHERECTOMY: CATH118256

## 2019-05-29 LAB — CBC
HCT: 38.5 % (ref 36.0–46.0)
Hemoglobin: 12.2 g/dL (ref 12.0–15.0)
MCH: 29.2 pg (ref 26.0–34.0)
MCHC: 31.7 g/dL (ref 30.0–36.0)
MCV: 92.1 fL (ref 80.0–100.0)
Platelets: 170 10*3/uL (ref 150–400)
RBC: 4.18 MIL/uL (ref 3.87–5.11)
RDW: 15.7 % — ABNORMAL HIGH (ref 11.5–15.5)
WBC: 10.6 10*3/uL — ABNORMAL HIGH (ref 4.0–10.5)
nRBC: 0 % (ref 0.0–0.2)

## 2019-05-29 LAB — POCT I-STAT, CHEM 8
BUN: 19 mg/dL (ref 8–23)
Calcium, Ion: 1.26 mmol/L (ref 1.15–1.40)
Chloride: 106 mmol/L (ref 98–111)
Creatinine, Ser: 1 mg/dL (ref 0.44–1.00)
Glucose, Bld: 119 mg/dL — ABNORMAL HIGH (ref 70–99)
HCT: 43 % (ref 36.0–46.0)
Hemoglobin: 14.6 g/dL (ref 12.0–15.0)
Potassium: 3.9 mmol/L (ref 3.5–5.1)
Sodium: 142 mmol/L (ref 135–145)
TCO2: 28 mmol/L (ref 22–32)

## 2019-05-29 LAB — GLUCOSE, CAPILLARY: Glucose-Capillary: 104 mg/dL — ABNORMAL HIGH (ref 70–99)

## 2019-05-29 LAB — POCT ACTIVATED CLOTTING TIME: Activated Clotting Time: 235 seconds

## 2019-05-29 SURGERY — ABDOMINAL AORTOGRAM W/LOWER EXTREMITY
Anesthesia: LOCAL | Laterality: Right

## 2019-05-29 MED ORDER — PANTOPRAZOLE SODIUM 40 MG PO TBEC
40.0000 mg | DELAYED_RELEASE_TABLET | Freq: Every day | ORAL | Status: DC
  Administered 2019-05-30: 40 mg via ORAL
  Filled 2019-05-29: qty 1

## 2019-05-29 MED ORDER — MIDAZOLAM HCL 2 MG/2ML IJ SOLN
INTRAMUSCULAR | Status: AC
  Filled 2019-05-29: qty 2

## 2019-05-29 MED ORDER — HEPARIN SODIUM (PORCINE) 1000 UNIT/ML IJ SOLN
INTRAMUSCULAR | Status: AC
  Filled 2019-05-29: qty 1

## 2019-05-29 MED ORDER — HYDRALAZINE HCL 20 MG/ML IJ SOLN: 5.0000 mg | INTRAMUSCULAR | Status: DC | PRN

## 2019-05-29 MED ORDER — METOPROLOL TARTRATE 5 MG/5ML IV SOLN: 2.0000 mg | INTRAVENOUS | Status: DC | PRN

## 2019-05-29 MED ORDER — LIDOCAINE HCL (PF) 1 % IJ SOLN
INTRAMUSCULAR | Status: AC
  Filled 2019-05-29: qty 30

## 2019-05-29 MED ORDER — CLOPIDOGREL BISULFATE 75 MG PO TABS
75.0000 mg | ORAL_TABLET | Freq: Every day | ORAL | Status: DC
  Administered 2019-05-30: 75 mg via ORAL
  Filled 2019-05-29: qty 1

## 2019-05-29 MED ORDER — PHENOL 1.4 % MT LIQD: 1.0000 | OROMUCOSAL | Status: DC | PRN

## 2019-05-29 MED ORDER — GUAIFENESIN-DM 100-10 MG/5ML PO SYRP
15.0000 mL | ORAL_SOLUTION | ORAL | Status: DC | PRN
  Administered 2019-05-30: 15 mL via ORAL
  Filled 2019-05-29: qty 15

## 2019-05-29 MED ORDER — SODIUM CHLORIDE 0.9 % WEIGHT BASED INFUSION: 1.0000 mL/kg/h | INTRAVENOUS | Status: AC

## 2019-05-29 MED ORDER — ONDANSETRON HCL 4 MG/2ML IJ SOLN: 4.0000 mg | Freq: Four times a day (QID) | INTRAMUSCULAR | Status: DC | PRN

## 2019-05-29 MED ORDER — POTASSIUM CHLORIDE CRYS ER 20 MEQ PO TBCR: 20.0000 meq | EXTENDED_RELEASE_TABLET | Freq: Once | ORAL | Status: DC

## 2019-05-29 MED ORDER — ASPIRIN EC 81 MG PO TBEC
81.0000 mg | DELAYED_RELEASE_TABLET | Freq: Every day | ORAL | Status: DC
  Administered 2019-05-30: 81 mg via ORAL
  Filled 2019-05-29: qty 1

## 2019-05-29 MED ORDER — LOSARTAN POTASSIUM 50 MG PO TABS
50.0000 mg | ORAL_TABLET | Freq: Every day | ORAL | Status: DC
  Administered 2019-05-30: 50 mg via ORAL
  Filled 2019-05-29: qty 1

## 2019-05-29 MED ORDER — OXYCODONE HCL 5 MG PO TABS: 5.0000 mg | ORAL_TABLET | ORAL | Status: DC | PRN

## 2019-05-29 MED ORDER — AMLODIPINE BESYLATE 5 MG PO TABS
5.0000 mg | ORAL_TABLET | Freq: Every day | ORAL | Status: DC
  Filled 2019-05-29: qty 1

## 2019-05-29 MED ORDER — SODIUM CHLORIDE 0.9 % IV SOLN: INTRAVENOUS | Status: DC

## 2019-05-29 MED ORDER — IODIXANOL 320 MG/ML IV SOLN
INTRAVENOUS | Status: DC | PRN
  Administered 2019-05-29: 150 mL via INTRA_ARTERIAL

## 2019-05-29 MED ORDER — ALUM & MAG HYDROXIDE-SIMETH 200-200-20 MG/5ML PO SUSP: 15.0000 mL | ORAL | Status: DC | PRN

## 2019-05-29 MED ORDER — LABETALOL HCL 5 MG/ML IV SOLN
10.0000 mg | INTRAVENOUS | Status: DC | PRN
  Administered 2019-05-30: 10 mg via INTRAVENOUS
  Filled 2019-05-29: qty 4

## 2019-05-29 MED ORDER — FENTANYL CITRATE (PF) 100 MCG/2ML IJ SOLN
INTRAMUSCULAR | Status: DC | PRN
  Administered 2019-05-29: 50 ug via INTRAVENOUS

## 2019-05-29 MED ORDER — ACETAMINOPHEN 325 MG PO TABS: 650.0000 mg | ORAL_TABLET | ORAL | Status: DC | PRN

## 2019-05-29 MED ORDER — HEPARIN (PORCINE) IN NACL 1000-0.9 UT/500ML-% IV SOLN
INTRAVENOUS | Status: DC | PRN
  Administered 2019-05-29 (×2): 500 mL

## 2019-05-29 MED ORDER — HEPARIN SODIUM (PORCINE) 1000 UNIT/ML IJ SOLN
INTRAMUSCULAR | Status: DC | PRN
  Administered 2019-05-29: 1000 [IU] via INTRAVENOUS
  Administered 2019-05-29: 7000 [IU] via INTRAVENOUS

## 2019-05-29 MED ORDER — MORPHINE SULFATE (PF) 2 MG/ML IV SOLN: 2.0000 mg | INTRAVENOUS | Status: DC | PRN

## 2019-05-29 MED ORDER — LIDOCAINE HCL (PF) 1 % IJ SOLN
INTRAMUSCULAR | Status: DC | PRN
  Administered 2019-05-29: 20 mL

## 2019-05-29 MED ORDER — ATORVASTATIN CALCIUM 40 MG PO TABS
40.0000 mg | ORAL_TABLET | Freq: Every day | ORAL | Status: DC
  Administered 2019-05-29 – 2019-05-30 (×2): 40 mg via ORAL
  Filled 2019-05-29 (×2): qty 1

## 2019-05-29 MED ORDER — MIDAZOLAM HCL 2 MG/2ML IJ SOLN
INTRAMUSCULAR | Status: DC | PRN
  Administered 2019-05-29: 1 mg via INTRAVENOUS

## 2019-05-29 MED ORDER — FAMOTIDINE 20 MG PO TABS: 20.0000 mg | ORAL_TABLET | Freq: Every day | ORAL | Status: DC | PRN

## 2019-05-29 MED ORDER — FENTANYL CITRATE (PF) 100 MCG/2ML IJ SOLN
INTRAMUSCULAR | Status: AC
  Filled 2019-05-29: qty 2

## 2019-05-29 MED ORDER — HEPARIN (PORCINE) IN NACL 1000-0.9 UT/500ML-% IV SOLN
INTRAVENOUS | Status: AC
  Filled 2019-05-29: qty 1000

## 2019-05-29 SURGICAL SUPPLY — 30 items
BAG SNAP BAND KOVER 36X36 (MISCELLANEOUS) ×1 IMPLANT
CATH AURYON 6FR ATHEREC 2.0 (CATHETERS) ×1 IMPLANT
CATH NAVICROSS ANG 65CM (CATHETERS) IMPLANT
CATH OMNI FLUSH 5F 65CM (CATHETERS) ×1 IMPLANT
CATH QUICKCROSS ANG SELECT (CATHETERS) ×1 IMPLANT
CATH SOFT-VU 4F 65 STRAIGHT (CATHETERS) IMPLANT
CATH SOFT-VU STRAIGHT 4F 65CM (CATHETERS) ×3
CATHETER NAVICROSS ANG 65CM (CATHETERS) ×3
CLOSURE MYNX CONTROL 6F/7F (Vascular Products) ×1 IMPLANT
COVER DOME SNAP 22 D (MISCELLANEOUS) ×1 IMPLANT
DCB RANGER 5.0X150 150 (BALLOONS) IMPLANT
DCB RANGER 6.0X100 135 (BALLOONS) IMPLANT
DEVICE TORQUE H2O (MISCELLANEOUS) ×1 IMPLANT
GUIDEWIRE ANGLED .035X150CM (WIRE) ×1 IMPLANT
KIT ENCORE 26 ADVANTAGE (KITS) ×1 IMPLANT
KIT MICROPUNCTURE NIT STIFF (SHEATH) ×1 IMPLANT
KIT PV (KITS) ×3 IMPLANT
RANGER DCB 5.0X150 150 (BALLOONS) ×3
RANGER DCB 6.0X100 135 (BALLOONS) ×3
SHEATH PINNACLE 5F 10CM (SHEATH) ×1 IMPLANT
SHEATH PINNACLE 6F 10CM (SHEATH) ×1 IMPLANT
SHEATH PINNACLE ST 6F 45CM (SHEATH) ×1 IMPLANT
SHEATH PROBE COVER 6X72 (BAG) ×1 IMPLANT
SHIELD RADPAD SCOOP 12X17 (MISCELLANEOUS) ×1 IMPLANT
SYR MEDRAD MARK V 150ML (SYRINGE) ×1 IMPLANT
TRANSDUCER W/STOPCOCK (MISCELLANEOUS) ×3 IMPLANT
TRAY PV CATH (CUSTOM PROCEDURE TRAY) ×3 IMPLANT
WIRE BENTSON .035X145CM (WIRE) ×1 IMPLANT
WIRE ROSEN-J .035X180CM (WIRE) ×1 IMPLANT
WIRE ZILIENT 014 4G (WIRE) ×1 IMPLANT

## 2019-05-29 NOTE — Progress Notes (Signed)
Hematoma to the left side noted to be slightly larger, Dr Myra Gianotti called to assess the hematoma. Dr. Myra Gianotti advised that it was about the same size it had been post procedure. He advised to hold manual pressure above insertion site while still being able to feel femoral graft pulsate. Manual pressure held for 20 minutes. No changes noted to hematoma during or after pressure held.

## 2019-05-29 NOTE — Progress Notes (Signed)
Post procedure hematoma.  Manual pressure was held for approximately 1 hour.  There did not appear to be any growth of the hematoma which is anterior in the lower abdomen.  She will be moniitored over night  [name]

## 2019-05-29 NOTE — Interval H&P Note (Signed)
History and Physical Interval Note:  05/29/2019 7:35 AM  [name]  has presented today for surgery, with the diagnosis of PAD.  The various methods of treatment have been discussed with the patient and family. After consideration of risks, benefits and other options for treatment, the patient has consented to  Procedure(s): ABDOMINAL AORTOGRAM W/LOWER EXTREMITY (Bilateral) as a surgical intervention.  The patient's history has been reviewed, patient examined, no change in status, stable for surgery.  I have reviewed the patient's chart and labs.  Questions were answered to the patient's satisfaction.     [name]

## 2019-05-29 NOTE — Progress Notes (Signed)
Pt received from cath lab. VSS. L groin level 0. Pt oriented to room and unit. Will continue to monitor.  Versie Starks, RN

## 2019-05-29 NOTE — Op Note (Signed)
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  Ultrasound-guided access, left femoral artery  2.  Abdominal aortogram  3.  Bilateral lower extremity runoff  4.  Laser atherectomy, right superficial femoral artery  5.  Drug-coated balloon angioplasty, right superficial femoral artery  6.  Closure device, Mynx  7.  Conscious sedation, 96 minutes   Indications: The patient has previously undergone left leg bypass and right leg stenting by Dr. Hart Rochester.  She has had a decrease in her ABIs with significant stenosis on the right she comes in today for further evaluation and possible intervention  Procedure:  The patient was identified in the holding area and taken to room 8.  The patient was then placed supine on the table and prepped and draped in the usual sterile fashion.  A time out was called.  Conscious sedation was administered with the use of IV fentanyl and Versed under continuous physician and nurse monitoring.  Heart rate, blood pressure, and oxygen saturation were continuously monitored.  Total sedation time was 96 minutes.  Ultrasound was used to evaluate the left common femoral artery.  It was patent .  A digital ultrasound image was acquired.  A micropuncture needle was used to access the left common femoral artery under ultrasound guidance.  An 018 wire was advanced without resistance and a micropuncture sheath was placed.  The 018 wire was removed and a benson wire was placed.  The micropuncture sheath was exchanged for a 5 french sheath.  An omniflush catheter was advanced over the wire to the level of L-1.  An abdominal angiogram was obtained.  Next, the catheter was pulled out of the aortic bifurcation and bilateral runoff was performed. Findings:   Aortogram: Irregular appearance of the infrarenal aorta  without stenosis which is heavily calcified.  There were several areas of ectasia.  The right renal artery is not visualized, presumably occluded.  There is a high-grade stenosis at the origin of the left renal artery, 80 to 90%.  Bilateral common and external iliac arteries are patent and heavily calcified.  No pressure gradient was found on the left side.  There appears to be a greater than 50% right common iliac stenosis  Right Lower Extremity: Right common femoral and profundofemoral artery are patent but calcified.  The superficial femoral artery is patent as is the stent in the midportion however there are multiple areas of greater than 90% stenosis within the stent.  There is a lesion at the patella and the native popliteal artery.  This is approximately 80%.  The below-knee popliteal artery is without stenosis but small in caliber.  There is single-vessel runoff via the peroneal artery  Left Lower Extremity: The left common femoral and profundofemoral artery are patent without stenosis.  There is a bypass graft from the common femoral to the popliteal artery which is widely patent without stenosis.  There is single-vessel runoff via the peroneal artery  Intervention: After the above images were acquired the decision was made to proceed with intervention.  Over a Bentson wire, a 6 French 45 cm Terumo sheath was advanced into the right external iliac artery.  The patient was fully heparinized.  I then advanced a 014 wire with the support of a quick cross catheter through the lesions in the superficial femoral-popliteal artery and get wire access into the below-knee popliteal artery.  I then used a 2.0 laser to perform laser atherectomy of the in-stent stenosis as well as the native gnosis in the popliteal artery.  After this was completed, I used a 5 x 150 Ranger drug-coated balloon followed by a 6 x 100 Ranger drug-coated balloon to treat the superficial femoral-popliteal artery.  Completion imaging was  then performed.  This shows resolution of the stenosis.  There was preservation of single-vessel peroneal runoff.  A minx was used for closure however this did not get good hemostasis and so manual pressure was held.  Impression:  #1  In-stent stenosis within the right superficial femoral artery, greater than 90% treated with laser atherectomy and drug-coated balloon angioplasty  #2  Native right popliteal artery stenosis, 80% treated with laser atherectomy and drug-coated balloon angioplasty  #3  No significant pressure gradient found within the left iliac system.  There does appear to be a hemodynamically significant lesion in the right common iliac artery.  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  #5  The patient will be brought back for intervention on the right iliac system as well as the left renal artery   V. Annamarie Major, M.D., [facility] [place] Office: [phone] Pager:  [phone]

## 2019-05-30 DIAGNOSIS — T82858A Stenosis of vascular prosthetic devices, implants and grafts, initial encounter: Secondary | ICD-10-CM | POA: Diagnosis not present

## 2019-05-30 DIAGNOSIS — I97638 Postprocedural hematoma of a circulatory system organ or structure following other circulatory system procedure: Secondary | ICD-10-CM

## 2019-05-30 LAB — BASIC METABOLIC PANEL
Anion gap: 11 (ref 5–15)
BUN: 16 mg/dL (ref 8–23)
CO2: 24 mmol/L (ref 22–32)
Calcium: 8.9 mg/dL (ref 8.9–10.3)
Chloride: 106 mmol/L (ref 98–111)
Creatinine, Ser: 0.95 mg/dL (ref 0.44–1.00)
GFR calc Af Amer: >60 mL/min (ref >60)
GFR calc non Af Amer: 58 mL/min — ABNORMAL LOW (ref >60)
Glucose, Bld: 109 mg/dL — ABNORMAL HIGH (ref 70–99)
Potassium: 4.1 mmol/L (ref 3.5–5.1)
Sodium: 141 mmol/L (ref 135–145)

## 2019-05-30 LAB — CBC
HCT: 36.8 % (ref 36.0–46.0)
Hemoglobin: 11.7 g/dL — ABNORMAL LOW (ref 12.0–15.0)
MCH: 28.8 pg (ref 26.0–34.0)
MCHC: 31.8 g/dL (ref 30.0–36.0)
MCV: 90.6 fL (ref 80.0–100.0)
Platelets: 174 10*3/uL (ref 150–400)
RBC: 4.06 MIL/uL (ref 3.87–5.11)
RDW: 15.8 % — ABNORMAL HIGH (ref 11.5–15.5)
WBC: 9.8 10*3/uL (ref 4.0–10.5)
nRBC: 0 % (ref 0.0–0.2)

## 2019-05-30 NOTE — Progress Notes (Addendum)
  Progress Note    05/30/2019 7:26 AM 1 Day Post-Op  Subjective:  Feels better and wants to go home.  Tm 99.5 now afebrile HR 70's-100's NSR 120's-160's systolic 94% RA  Vitals:   05/30/19 0358 05/30/19 0530  BP: (!) 163/77 120/64  Pulse: 88 65  Resp: 20 (!) 22  Temp: 98.4 F (36.9 C)   SpO2: 96% 94%    Physical Exam: Cardiac:  regular Lungs:   Non labored Incisions:  Left groin soft without hematoma Extremities:  Brisk bilateral AT doppler signals as well as PT/pero bilaterally Abdomen:  Soft, NT/ND  CBC    Component Value Date/Time   WBC 9.8 05/30/2019 0355   RBC 4.06 05/30/2019 0355   HGB 11.7 (L) 05/30/2019 0355   HCT 36.8 05/30/2019 0355   PLT 174 05/30/2019 0355   MCV 90.6 05/30/2019 0355   MCH 28.8 05/30/2019 0355   MCHC 31.8 05/30/2019 0355   RDW 15.8 (H) 05/30/2019 0355    BMET    Component Value Date/Time   NA 141 05/30/2019 0355   K 4.1 05/30/2019 0355   CL 106 05/30/2019 0355   CO2 24 05/30/2019 0355   GLUCOSE 109 (H) 05/30/2019 0355   BUN 16 05/30/2019 0355   CREATININE 0.95 05/30/2019 0355   CALCIUM 8.9 05/30/2019 0355   GFRNONAA 58 (L) 05/30/2019 0355   GFRAA >60 05/30/2019 0355    INR    Component Value Date/Time   INR 1.01 10/10/2012 1600     Intake/Output Summary (Last 24 hours) at 05/30/2019 0726 Last data filed at 05/30/2019 0300 Gross per 24 hour  Intake 1599.84 ml  Output 200 ml  Net 1399.84 ml     Assessment:  80 y.o. female is s/p:  Procedure Performed:             1.  Ultrasound-guided access, left femoral artery             2.  Abdominal aortogram             3.  Bilateral lower extremity runoff             4.  Laser atherectomy, right superficial femoral artery             5.  Drug-coated balloon angioplasty, right superficial femoral artery             6.  Closure device, Mynx             7.  Conscious sedation, 96 minutes  1 Day Post-Op  Plan: -pt doing well this am.  Hematoma in left groin soft.   Doppler signals bilateral AT/PT/pero -pt will ambulate and then will dc home later this morning. -givne findings on agm yesterday, pt will be scheduled in about 3-4 weeks for agm with renal artery stent and possible right LLE intervention .    Doreatha Massed, PA-C [facility] [phone] 05/30/2019 7:26 AM   I agree with the above.  Her hematoma has decreased in size. She has excellent bilateral signals.  She will mobilize and be discharged.  We discussed coming back in 3-4 weeks to address her left renal artery stenosis and right iliac stenosis from a right groin approach.  [name]

## 2019-05-30 NOTE — Discharge Summary (Signed)
Discharge Summary    [name] 20-Jul-1940 79 y.o. female  [id number]  Admission Date: 05/29/2019  Discharge Date: 05/30/2019  Physician: Nada Libman, MD  Admission Diagnosis: PAD (peripheral artery disease) [facility]) [I73.9]   HPI:   This is a 79 y.o. female who is s/p left femoropopliteal artery bypass graft using saphenous vein on 10/12/2012, left superficial femoral artery stent 03/11/2005, left popliteal artery stent 04/30/2009, and right superficial femoral artery stent on 10/17/2009.  She was recently in the hospital in [place] for a near syncopal episode.  Her work-up did not show any obvious cause.  She does state that she was hypertensive and hyperglycemic.  The patient continues to take dual antiplatelet therapy.  She is on Zocor for hypercholesterolemia.  She takes an ARB for hypertension.  Hospital Course:  The patient was admitted to the hospital and taken to the operating room on 05/29/2019 and underwent: 1.  Ultrasound-guided access, left femoral artery 2.  Abdominal aortogram 3.  Bilateral lower extremity runoff 4.  Laser atherectomy, right superficial femoral artery 5.  Drug-coated balloon angioplasty, right superficial femoral artery 6.  Closure device, Mynx 7.  Conscious sedation, 96 minutes    Findings:              Aortogram: Irregular appearance of the infrarenal aorta without stenosis which is heavily calcified.  There were several areas of ectasia.  The right renal artery is not visualized, presumably occluded.  There is a high-grade stenosis at the origin of the left renal artery, 80 to 90%.  Bilateral common and external iliac arteries are patent and heavily calcified.  No pressure gradient was found on the left side.  There appears to be a greater than 50% right common iliac stenosis             Right Lower Extremity: Right common femoral and profundofemoral artery are patent but calcified.  The superficial femoral artery is patent as is the  stent in the midportion however there are multiple areas of greater than 90% stenosis within the stent.  There is a lesion at the patella and the native popliteal artery.  This is approximately 80%.  The below-knee popliteal artery is without stenosis but small in caliber.  There is single-vessel runoff via the peroneal artery             Left Lower Extremity: The left common femoral and profundofemoral artery are patent without stenosis.  There is a bypass graft from the common femoral to the popliteal artery which is widely patent without stenosis.  There is single-vessel runoff   The pt tolerated the procedure well and was transported to the PACU in good condition.   Post procedure, she developed a left groin hematoma.  She was admitted for observation.  By POD 1, she was doing well.  Left groin soft and hgb stable.  She is discharged home.    She will return in 3-4 weeks for angiogram for renal artery stenting and RLE intervention.   The remainder of the hospital course consisted of increasing mobilization and increasing intake of solids without difficulty.  CBC    Component Value Date/Time   WBC 9.8 05/30/2019 0355   RBC 4.06 05/30/2019 0355   HGB 11.7 (L) 05/30/2019 0355   HCT 36.8 05/30/2019 0355   PLT 174 05/30/2019 0355   MCV 90.6 05/30/2019 0355   MCH 28.8 05/30/2019 0355   MCHC 31.8 05/30/2019 0355   RDW 15.8 (H) 05/30/2019 0355  BMET    Component Value Date/Time   NA 141 05/30/2019 0355   K 4.1 05/30/2019 0355   CL 106 05/30/2019 0355   CO2 24 05/30/2019 0355   GLUCOSE 109 (H) 05/30/2019 0355   BUN 16 05/30/2019 0355   CREATININE 0.95 05/30/2019 0355   CALCIUM 8.9 05/30/2019 0355   GFRNONAA 58 (L) 05/30/2019 0355   GFRAA >60 05/30/2019 0355      Discharge Instructions    Discharge patient   Complete by: As directed    Discharge pt after she has eaten breakfast and walked in the hallways.   Discharge disposition: 01-Home or Self Care   Discharge patient  date: 05/30/2019      Discharge Diagnosis:  PAD (peripheral artery disease) ([place]) [I73.9]  Secondary Diagnosis: Patient Active Problem List   Diagnosis Date Noted  . PAD (peripheral artery disease) ([place]) 03/26/2014  . Carotid stenosis, asymptomatic 03/26/2014  . Surgical aftercare, circulatory system 10/16/2013  . Aftercare following surgery of the circulatory system, [place] 10/02/2013  . Swelling of limb 10/02/2013  . Atherosclerotic PVD with intermittent claudication ([place]) 03/20/2013  . PVD (peripheral vascular disease) ([place]) 10/10/2012  . Peripheral vascular disease, unspecified ([place]) 06/03/2011  . Occlusion and stenosis of carotid artery without mention of cerebral infarction 06/03/2011   Past Medical History:  Diagnosis Date  . Carotid artery occlusion   . Coronary artery disease   . Diabetes mellitus    borderline , no longer treated /w medicine   . Hyperlipidemia   . Hypertension   . Peripheral vascular disease ([place])    emboli of left foot from left superficial femoral and popliteal occulsive disease  . Stroke [facility]) 2008     Allergies as of 05/30/2019   No Known Allergies     Medication List    STOP taking these medications   Advil 200 MG tablet Generic drug: ibuprofen     TAKE these medications   amLODipine 5 MG tablet Commonly known as: NORVASC Take 5 mg by mouth daily.   aspirin 81 MG tablet Take 81 mg by mouth daily.   clopidogrel 75 MG tablet Commonly known as: PLAVIX Take 75 mg by mouth daily.   famotidine 20 MG tablet Commonly known as: PEPCID Take 20 mg by mouth daily as needed for heartburn.   losartan 50 MG tablet Commonly known as: COZAAR Take 50 mg by mouth Daily.   metFORMIN 500 MG 24 hr tablet Commonly known as: GLUCOPHAGE-XR Take 500 mg by mouth daily.   simvastatin 80 MG tablet Commonly known as: ZOCOR Take 80 mg by mouth at bedtime.   SYSTANE COMPLETE OP Place 1 drop into the left eye daily as needed (Dry eye).   Vitamin  D3 50 MCG (2000 UT) Tabs Take 2,000 Units by mouth 4 (four) times daily.         Instructions:  [facility]  Discharge Instructions  Lower Extremity Angiogram; Angioplasty/Stenting  Please refer to the following instructions for your post-procedure care. Your surgeon or physician assistant will discuss any changes with you.  Activity  Avoid lifting more than 8 pounds (1 gallons of milk) for 72 hours (3 days) after your procedure. You may walk as much as you can tolerate. It's OK to drive after 72 hours.  Bathing/Showering  You may shower the day after your procedure. If you have a bandage, you may remove it at 24- 48 hours. Clean your incision site with mild soap and water. Pat the  area dry with a clean towel.  Diet  Resume your pre-procedure diet. There are no special food restrictions following this procedure. All patients with peripheral vascular disease should follow a low fat/low cholesterol diet. In order to heal from your surgery, it is CRITICAL to get adequate nutrition. Your body requires vitamins, minerals, and protein. Vegetables are the best source of vitamins and minerals. Vegetables also provide the perfect balance of protein. Processed food has little nutritional value, so try to avoid this.  Medications  Resume taking all of your medications unless your doctor tells you not to. If your incision is causing pain, you may take over-the-counter pain relievers such as acetaminophen (Tylenol)  Follow Up  Follow up will be arranged at the time of your procedure. You may have an office visit scheduled or may be scheduled for surgery. Ask your surgeon if you have any questions.  Please call us immediately for any of the following conditions: .Severe or worsening pain your legs or feet at rest or with walking. .Increased pain, redness, drainage at your groin puncture site. .Fever of 101 degrees or higher. .If you have any mild or slow  bleeding from your puncture site: lie down, apply firm constant pressure over the area with a piece of gauze or a clean wash cloth for 30 minutes- no peeking!, call 911 right away if you are still bleeding after 30 minutes, or if the bleeding is heavy and unmanageable.  Reduce your risk factors of vascular disease:  . Stop smoking. If you would like help call [facility] at 1-800-QUIT-NOW (559-875-2616) or [place] at 260 708 3727. . Manage your cholesterol . Maintain a desired weight . Control your diabetes . Keep your blood pressure down .  If you have any questions, please call the office at [phone]  Prescriptions given: 1.  none  Disposition: home  Patient's condition: is Good  Follow up: 1. 3-4 weeks angiogram   Doreatha Massed, PA-C [facility] [phone] 05/30/2019  7:35 AM

## 2019-05-30 NOTE — Discharge Instructions (Signed)
   [facility]  Discharge Instructions  Lower Extremity Angiogram; Angioplasty/Stenting  Please refer to the following instructions for your post-procedure care. Your surgeon or physician assistant will discuss any changes with you.  Activity  Avoid lifting more than 8 pounds (1 gallons of milk) for 72 hours (3 days) after your procedure. You may walk as much as you can tolerate. It's OK to drive after 72 hours.  Bathing/Showering  You may shower the day after your procedure. If you have a bandage, you may remove it at 24- 48 hours. Clean your incision site with mild soap and water. Pat the area dry with a clean towel.  Diet  Resume your pre-procedure diet. There are no special food restrictions following this procedure. All patients with peripheral vascular disease should follow a low fat/low cholesterol diet. In order to heal from your surgery, it is CRITICAL to get adequate nutrition. Your body requires vitamins, minerals, and protein. Vegetables are the best source of vitamins and minerals. Vegetables also provide the perfect balance of protein. Processed food has little nutritional value, so try to avoid this.  Medications  Resume taking all of your medications unless your doctor tells you not to. If your incision is causing pain, you may take over-the-counter pain relievers such as acetaminophen (Tylenol)  HOLD TAKING METFORMIN FOR 3 DAYS GIVEN YOU HAD IV CONTRAST AND THIS CAN AFFECT YOUR KIDNEYS. STOP TAKING IBUPROFEN AS THIS CAN AFFECT YOUR KIDNEYS TOO.  Follow Up  Follow up will be arranged at the time of your procedure. You may have an office visit scheduled or may be scheduled for surgery. Ask your surgeon if you have any questions.  Please call us immediately for any of the following conditions: .Severe or worsening pain your legs or feet at rest or with walking. .Increased pain, redness, drainage at your groin puncture site. .Fever of  101 degrees or higher. .If you have any mild or slow bleeding from your puncture site: lie down, apply firm constant pressure over the area with a piece of gauze or a clean wash cloth for 30 minutes- no peeking!, call 911 right away if you are still bleeding after 30 minutes, or if the bleeding is heavy and unmanageable.  Reduce your risk factors of vascular disease:  . Stop smoking. If you would like help call [facility] at 1-800-QUIT-NOW (450-724-8988) or [place] at 303-888-1771. . Manage your cholesterol . Maintain a desired weight . Control your diabetes . Keep your blood pressure down .  If you have any questions, please call the office at [phone]

## 2019-05-30 NOTE — Plan of Care (Signed)

## 2019-05-30 NOTE — Progress Notes (Signed)
Discharge : Discharge summary reviewed with patient.  Medications list reviewed.  IV access discontinued.  ADL restrictions reviewed.  CCMD monitoring discontinued.  Assisted to private vehicle by staff.

## 2019-05-31 ENCOUNTER — Other Ambulatory Visit: Payer: Self-pay

## 2019-06-01 ENCOUNTER — Other Ambulatory Visit: Payer: Self-pay

## 2019-06-15 ENCOUNTER — Other Ambulatory Visit (HOSPITAL_COMMUNITY): Payer: Medicare Other

## 2019-06-18 ENCOUNTER — Other Ambulatory Visit (HOSPITAL_COMMUNITY)
Admission: RE | Admit: 2019-06-18 | Discharge: 2019-06-18 | Disposition: A | Discharge status: Home / Self Care | Payer: Medicare Other | Source: Ambulatory Visit | Attending: Surgery | Admitting: Surgery

## 2019-06-18 DIAGNOSIS — Z20822 Contact with and (suspected) exposure to covid-19: Secondary | ICD-10-CM | POA: Insufficient documentation

## 2019-06-18 DIAGNOSIS — Z01812 Encounter for preprocedural laboratory examination: Secondary | ICD-10-CM | POA: Insufficient documentation

## 2019-06-18 LAB — SARS CORONAVIRUS 2 (TAT 6-24 HRS): SARS Coronavirus 2: NEGATIVE

## 2019-06-19 ENCOUNTER — Other Ambulatory Visit: Payer: Self-pay

## 2019-06-19 ENCOUNTER — Ambulatory Visit (HOSPITAL_COMMUNITY)
Admission: RE | Admit: 2019-06-19 | Discharge: 2019-06-19 | Disposition: A | Discharge status: Home / Self Care | Payer: Medicare Other | Attending: Surgery | Admitting: Surgery

## 2019-06-19 ENCOUNTER — Encounter (HOSPITAL_COMMUNITY)
Admission: RE | Disposition: A | Discharge status: Home / Self Care | Payer: Self-pay | Source: Home / Self Care | Attending: Surgery

## 2019-06-19 DIAGNOSIS — Z7902 Long term (current) use of antithrombotics/antiplatelets: Secondary | ICD-10-CM | POA: Diagnosis not present

## 2019-06-19 DIAGNOSIS — I701 Atherosclerosis of renal artery: Secondary | ICD-10-CM | POA: Insufficient documentation

## 2019-06-19 DIAGNOSIS — Z8249 Family history of ischemic heart disease and other diseases of the circulatory system: Secondary | ICD-10-CM | POA: Diagnosis not present

## 2019-06-19 DIAGNOSIS — Z79899 Other long term (current) drug therapy: Secondary | ICD-10-CM | POA: Diagnosis not present

## 2019-06-19 DIAGNOSIS — Z7984 Long term (current) use of oral hypoglycemic drugs: Secondary | ICD-10-CM | POA: Diagnosis not present

## 2019-06-19 DIAGNOSIS — I70213 Atherosclerosis of native arteries of extremities with intermittent claudication, bilateral legs: Secondary | ICD-10-CM | POA: Diagnosis not present

## 2019-06-19 DIAGNOSIS — I6523 Occlusion and stenosis of bilateral carotid arteries: Secondary | ICD-10-CM | POA: Diagnosis not present

## 2019-06-19 DIAGNOSIS — I2510 Atherosclerotic heart disease of native coronary artery without angina pectoris: Secondary | ICD-10-CM | POA: Insufficient documentation

## 2019-06-19 DIAGNOSIS — E785 Hyperlipidemia, unspecified: Secondary | ICD-10-CM | POA: Diagnosis not present

## 2019-06-19 DIAGNOSIS — I10 Essential (primary) hypertension: Secondary | ICD-10-CM | POA: Insufficient documentation

## 2019-06-19 DIAGNOSIS — F17210 Nicotine dependence, cigarettes, uncomplicated: Secondary | ICD-10-CM | POA: Insufficient documentation

## 2019-06-19 DIAGNOSIS — Z95828 Presence of other vascular implants and grafts: Secondary | ICD-10-CM | POA: Insufficient documentation

## 2019-06-19 DIAGNOSIS — Z7982 Long term (current) use of aspirin: Secondary | ICD-10-CM | POA: Diagnosis not present

## 2019-06-19 DIAGNOSIS — Z8673 Personal history of transient ischemic attack (TIA), and cerebral infarction without residual deficits: Secondary | ICD-10-CM | POA: Insufficient documentation

## 2019-06-19 DIAGNOSIS — E119 Type 2 diabetes mellitus without complications: Secondary | ICD-10-CM | POA: Insufficient documentation

## 2019-06-19 HISTORY — PX: PERIPHERAL VASCULAR INTERVENTION: CATH118257

## 2019-06-19 LAB — POCT I-STAT, CHEM 8
BUN: 18 mg/dL (ref 8–23)
Calcium, Ion: 1.18 mmol/L (ref 1.15–1.40)
Chloride: 111 mmol/L (ref 98–111)
Creatinine, Ser: 0.9 mg/dL (ref 0.44–1.00)
Glucose, Bld: 107 mg/dL — ABNORMAL HIGH (ref 70–99)
HCT: 35 % — ABNORMAL LOW (ref 36.0–46.0)
Hemoglobin: 11.9 g/dL — ABNORMAL LOW (ref 12.0–15.0)
Potassium: 4.2 mmol/L (ref 3.5–5.1)
Sodium: 145 mmol/L (ref 135–145)
TCO2: 25 mmol/L (ref 22–32)

## 2019-06-19 SURGERY — PERIPHERAL VASCULAR INTERVENTION
Anesthesia: LOCAL

## 2019-06-19 MED ORDER — ONDANSETRON HCL 4 MG/2ML IJ SOLN: 4.0000 mg | Freq: Four times a day (QID) | INTRAMUSCULAR | Status: DC | PRN

## 2019-06-19 MED ORDER — LIDOCAINE HCL (PF) 1 % IJ SOLN
INTRAMUSCULAR | Status: DC | PRN
  Administered 2019-06-19: 18 mL

## 2019-06-19 MED ORDER — SODIUM CHLORIDE 0.9% FLUSH: 3.0000 mL | INTRAVENOUS | Status: DC | PRN

## 2019-06-19 MED ORDER — HEPARIN (PORCINE) IN NACL 1000-0.9 UT/500ML-% IV SOLN
INTRAVENOUS | Status: AC
  Filled 2019-06-19: qty 500

## 2019-06-19 MED ORDER — SODIUM CHLORIDE 0.9% FLUSH: 3.0000 mL | Freq: Two times a day (BID) | INTRAVENOUS | Status: DC

## 2019-06-19 MED ORDER — HEPARIN SODIUM (PORCINE) 1000 UNIT/ML IJ SOLN
INTRAMUSCULAR | Status: DC | PRN
  Administered 2019-06-19: 7000 [IU] via INTRAVENOUS

## 2019-06-19 MED ORDER — FENTANYL CITRATE (PF) 100 MCG/2ML IJ SOLN
INTRAMUSCULAR | Status: AC
  Filled 2019-06-19: qty 2

## 2019-06-19 MED ORDER — LABETALOL HCL 5 MG/ML IV SOLN: 10.0000 mg | INTRAVENOUS | Status: DC | PRN

## 2019-06-19 MED ORDER — HYDRALAZINE HCL 20 MG/ML IJ SOLN: 5.0000 mg | INTRAMUSCULAR | Status: DC | PRN

## 2019-06-19 MED ORDER — OXYCODONE HCL 5 MG PO TABS: 5.0000 mg | ORAL_TABLET | ORAL | Status: DC | PRN

## 2019-06-19 MED ORDER — MORPHINE SULFATE (PF) 2 MG/ML IV SOLN: 2.0000 mg | INTRAVENOUS | Status: DC | PRN

## 2019-06-19 MED ORDER — MIDAZOLAM HCL 2 MG/2ML IJ SOLN
INTRAMUSCULAR | Status: DC | PRN
  Administered 2019-06-19: 1 mg via INTRAVENOUS

## 2019-06-19 MED ORDER — ACETAMINOPHEN 325 MG PO TABS: 650.0000 mg | ORAL_TABLET | ORAL | Status: DC | PRN

## 2019-06-19 MED ORDER — SODIUM CHLORIDE 0.9 % IV SOLN: 250.0000 mL | INTRAVENOUS | Status: DC | PRN

## 2019-06-19 MED ORDER — FENTANYL CITRATE (PF) 100 MCG/2ML IJ SOLN
INTRAMUSCULAR | Status: DC | PRN
  Administered 2019-06-19: 50 ug via INTRAVENOUS

## 2019-06-19 MED ORDER — IODIXANOL 320 MG/ML IV SOLN
INTRAVENOUS | Status: DC | PRN
  Administered 2019-06-19: 75 mL

## 2019-06-19 MED ORDER — HEPARIN SODIUM (PORCINE) 1000 UNIT/ML IJ SOLN
INTRAMUSCULAR | Status: AC
  Filled 2019-06-19: qty 1

## 2019-06-19 MED ORDER — HEPARIN (PORCINE) IN NACL 1000-0.9 UT/500ML-% IV SOLN
INTRAVENOUS | Status: DC | PRN
  Administered 2019-06-19 (×2): 500 mL

## 2019-06-19 MED ORDER — SODIUM CHLORIDE 0.9 % IV SOLN: INTRAVENOUS | Status: DC

## 2019-06-19 MED ORDER — LIDOCAINE HCL (PF) 1 % IJ SOLN
INTRAMUSCULAR | Status: AC
  Filled 2019-06-19: qty 30

## 2019-06-19 MED ORDER — SODIUM CHLORIDE 0.9 % WEIGHT BASED INFUSION: 1.0000 mL/kg/h | INTRAVENOUS | Status: DC

## 2019-06-19 MED ORDER — MIDAZOLAM HCL 2 MG/2ML IJ SOLN
INTRAMUSCULAR | Status: AC
  Filled 2019-06-19: qty 2

## 2019-06-19 SURGICAL SUPPLY — 17 items
CATH OMNI FLUSH 5F 65CM (CATHETERS) ×3 IMPLANT
CATH STRAIGHT 5FR 65CM (CATHETERS) ×3 IMPLANT
CLOSURE MYNX CONTROL 6F/7F (Vascular Products) ×3 IMPLANT
GUIDE CATH VISTA JR4 6F (CATHETERS) ×3 IMPLANT
KIT ENCORE 26 ADVANTAGE (KITS) ×3 IMPLANT
KIT MICROPUNCTURE NIT STIFF (SHEATH) ×3 IMPLANT
KIT PV (KITS) ×3 IMPLANT
SHEATH PINNACLE 6F 10CM (SHEATH) ×3 IMPLANT
SHEATH PROBE COVER 6X72 (BAG) ×3 IMPLANT
STENT HERCULINK RX 6.0X12X135 (Permanent Stent) ×3 IMPLANT
SYR MEDRAD MARK V 150ML (SYRINGE) ×3 IMPLANT
TRANSDUCER W/STOPCOCK (MISCELLANEOUS) ×3 IMPLANT
TRAY PV CATH (CUSTOM PROCEDURE TRAY) ×3 IMPLANT
TUBING CIL FLEX 10 FLL-RA (TUBING) ×3 IMPLANT
WIRE HITORQ VERSACORE ST 145CM (WIRE) ×3 IMPLANT
WIRE J 3MM .035X145CM (WIRE) ×3 IMPLANT
WIRE SPARTACORE .014X190CM (WIRE) ×3 IMPLANT

## 2019-06-19 NOTE — Op Note (Signed)
    Patient name: [name] MRN: [id number] DOB: Oct 27, 1940 Sex: female  06/19/2019 Pre-operative Diagnosis: Left renal artery stenosis Post-operative diagnosis:  Same Surgeon:  [name] Procedure Performed:  1.  Ultrasound-guided access, right femoral artery  2.  Abdominal aortogram  3.  Left renal artery stent  4.  Conscious sedation, 29 minutes  5.  Closure device, Mynx   Indications: Several weeks ago the patient underwent aortogram with intervention on her legs.  During the study she was found to have an occluded right renal artery and a greater than 80% left renal artery stenosis.  I do not have consent to fix that at this time and therefore I elected to bring her back today for treatment.  I was also concerned about an ostial right common iliac stenosis.  Procedure:  The patient was identified in the holding area and taken to room 8.  The patient was then placed supine on the table and prepped and draped in the usual sterile fashion.  A time out was called.  Conscious sedation was administered with the use of IV fentanyl and Versed under continuous physician and nurse monitoring.  Heart rate, blood pressure, and oxygen saturation were continuously monitored.  Total sedation time was 29 minutes.  Ultrasound was used to evaluate the right common femoral artery.  It was patent .  A digital ultrasound image was acquired.  A micropuncture needle was used to access the right common femoral artery under ultrasound guidance.  An 018 wire was advanced without resistance and a micropuncture sheath was placed.  The 018 wire was removed and a benson wire was placed.  The micropuncture sheath was exchanged for a 6 french sheath.  An omniflush catheter was advanced over the wire to the level of L-1.  An abdominal angiogram was obtained.    Findings:   Aortogram: Occluded right renal artery.  80 to 90% left renal artery stenosis.  The infrarenal abdominal aorta is ectatic and irregular without  significant stenosis.  There is luminal narrowing within the proximal right common iliac artery with poststenotic dilatation/aneurysmal change in the mid common iliac artery.  The external iliac artery on the right is widely patent.  No stenosis noted on the left side.  Intervention: After the above images were acquired, I set up for intervention.  The patient was fully heparinized.  Using a JR4 guide catheter and a [place] core wire, the left renal artery was selected.  I elected to primarily stent this.  A 6 x 12 Herculink balloon expandable stent was inserted with a slight protrusion into the aorta.  The stent was then deployed.  Completion imaging showed no significant residual stenosis.  Catheters and wires were removed.  A minx device was used for closure.  Impression:  #1  80-90% left renal artery stenosis successfully treated with a 6 x 12 Herculink stent with no residual stenosis  #2  Only a 10 mm pressure gradient was seen across the proximal right common iliac lesion.  Given the common iliac artery is ectatic and bordering on aneurysmal, I elected not to treat this.   Juleen China, M.D., [facility] [place] Office: [phone] Pager:  [phone]

## 2019-06-19 NOTE — H&P (Signed)
   Patient name: [name] MRN: [id number] DOB: 05/09/1940 Sex: female   HISTORY OF PRESENT ILLNESS:   [name] is a 79 y.o. female who comes in today for repeat angiogram and left renal artery stent.  CURRENT MEDICATIONS:    Current Facility-Administered Medications  Medication Dose Route Frequency Provider Last Rate Last Admin  . 0.9 %  sodium chloride infusion   Intravenous Continuous Nada Libman, MD 100 mL/hr at 06/19/19 0708 New Bag at 06/19/19 0708    REVIEW OF SYSTEMS:   [X]  denotes positive finding, [ ]  denotes negative finding Cardiac  Comments:  Chest pain or chest pressure:    Shortness of breath upon exertion:    Short of breath when lying flat:    Irregular heart rhythm:    Constitutional    Fever or chills:      PHYSICAL EXAM:   Vitals:   06/19/19 0545  BP: (!) 150/76  Pulse: 77  Resp: 20  Temp: 97.6 F (36.4 C)  TempSrc: Oral  SpO2: 99%  Weight: 65.3 kg  Height: 5\' 3"  (1.6 m)    GENERAL: The patient is a well-nourished female, in no acute distress. The vital signs are documented above. CARDIOVASCULAR: There is a regular rate and rhythm. PULMONARY: Non-labored respirations   STUDIES:      MEDICAL ISSUES:   Plan right femoral access and intervention on high grade left renal artery stenosis and right common iliac artery  , MD, FACS [facility] [phone] Pager [phone]

## 2019-06-19 NOTE — Discharge Instructions (Signed)

## 2019-06-20 ENCOUNTER — Encounter (HOSPITAL_COMMUNITY): Payer: Self-pay | Admitting: Surgery

## 2019-06-21 ENCOUNTER — Encounter (HOSPITAL_COMMUNITY): Payer: Self-pay | Admitting: Surgery

## 2019-06-22 ENCOUNTER — Other Ambulatory Visit: Payer: Self-pay | Admitting: *Deleted

## 2019-06-22 DIAGNOSIS — I70213 Atherosclerosis of native arteries of extremities with intermittent claudication, bilateral legs: Secondary | ICD-10-CM

## 2019-06-22 DIAGNOSIS — Z95828 Presence of other vascular implants and grafts: Secondary | ICD-10-CM

## 2019-07-02 ENCOUNTER — Ambulatory Visit (HOSPITAL_COMMUNITY)
Admission: RE | Admit: 2019-07-02 | Discharge: 2019-07-02 | Disposition: A | Discharge status: Home / Self Care | Payer: Medicare Other | Source: Ambulatory Visit | Attending: Surgery | Admitting: Surgery

## 2019-07-02 ENCOUNTER — Ambulatory Visit (INDEPENDENT_AMBULATORY_CARE_PROVIDER_SITE_OTHER): Payer: Medicare Other | Admitting: Surgery

## 2019-07-02 ENCOUNTER — Other Ambulatory Visit: Payer: Self-pay

## 2019-07-02 ENCOUNTER — Encounter: Payer: Self-pay | Admitting: Surgery

## 2019-07-02 ENCOUNTER — Ambulatory Visit (INDEPENDENT_AMBULATORY_CARE_PROVIDER_SITE_OTHER)
Admission: RE | Admit: 2019-07-02 | Discharge: 2019-07-02 | Disposition: A | Discharge status: Home / Self Care | Payer: Medicare Other | Source: Ambulatory Visit | Attending: Surgery | Admitting: Surgery

## 2019-07-02 VITALS — BP 152/72 | HR 68 | Resp 16 | Ht 63.0 in | Wt 142.0 lb

## 2019-07-02 DIAGNOSIS — I779 Disorder of arteries and arterioles, unspecified: Secondary | ICD-10-CM

## 2019-07-02 DIAGNOSIS — I70213 Atherosclerosis of native arteries of extremities with intermittent claudication, bilateral legs: Secondary | ICD-10-CM | POA: Diagnosis present

## 2019-07-02 DIAGNOSIS — I6523 Occlusion and stenosis of bilateral carotid arteries: Secondary | ICD-10-CM | POA: Diagnosis not present

## 2019-07-02 DIAGNOSIS — Z95828 Presence of other vascular implants and grafts: Secondary | ICD-10-CM

## 2019-07-02 NOTE — Progress Notes (Signed)
[facility] [place]  Patient name: [name] MRN: [id number] DOB: 01-29-1940 Sex: female   REASON FOR VISIT:    Follow up  HISOTRY OF PRESENT ILLNESS:   [name] a 79y.o.female,who is s/p left femoropopliteal artery bypass graft using saphenous vein on 10/12/2012, left superficial femoral artery stent 03/11/2005, left popliteal artery stent 04/30/2009, and right superficial femoral artery stent on 10/17/2009.  At her last visit, she had a drop in her ABIs and was scheduled for angiography.  This occurred on 05/29/2019.  She was found to have in-stent stenosis within the right superficial femoral artery which was greater than 90%.  I treated this with laser atherectomy and drug-coated balloon angioplasty.  There was a native popliteal artery stenosis that was treated with laser atherectomy and drug-coated balloon angioplasty.  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  She says that after her procedure her right leg feels better.  She has not noticed any significant change in her blood pressure after the renal stent.  She was recently in the hospital in [place] for a near syncopal episode.  Her work-up did not show any obvious cause.  She does state that she was hypertensive and hyperglycemic.  The patient continues to take dual antiplatelet therapy.  She is on Zocor for hypercholesterolemia.  She takes an ARB for hypertension.  PAST MEDICAL HISTORY:   Past Medical History:  Diagnosis Date  . Carotid artery occlusion   . Coronary artery disease   . Diabetes mellitus    borderline , no longer treated /w medicine   . Hyperlipidemia   . Hypertension   . Peripheral vascular disease (HCC)    emboli of left foot from left superficial femoral and popliteal occulsive disease    . Stroke [facility]) 2008     FAMILY HISTORY:   Family History  Problem Relation Age of Onset  . Coronary artery disease Mother   . Heart disease Mother        After age 37  . Heart attack Mother   . Coronary artery disease Sister   . Heart disease Sister        Before  age 29 -  Heart Transplant  . Cancer Sister        Kidney  . Heart attack Sister   . Heart disease Father        After age 66  . Other Father        amputation    SOCIAL HISTORY:   Social History   Tobacco Use  . Smoking status: Light Tobacco Smoker    Packs/day: 0.25    Years: 50.00    Pack years: 12.50    Types: Cigarettes  . Smokeless tobacco: Never Used  . Tobacco comment: pt states she smokes about 4-5 cigs per day and is trying to quit  Substance Use Topics  . Alcohol use: No     ALLERGIES:   No Known Allergies   CURRENT MEDICATIONS:   Current Outpatient Medications  Medication Sig Dispense Refill  . aspirin 81 MG tablet Take 81 mg by mouth daily.    . Cholecalciferol (VITAMIN D3) 50 MCG (2000 UT) TABS Take 2,000 Units by mouth 4 (four) times daily.    . clopidogrel (PLAVIX) 75 MG tablet Take 75 mg by mouth daily.    Marland Kitchen  famotidine (PEPCID) 20 MG tablet Take 20 mg by mouth daily as needed for heartburn.     . losartan (COZAAR) 50 MG tablet Take 50 mg by mouth Daily.    . metFORMIN (GLUCOPHAGE-XR) 500 MG 24 hr tablet Take 500 mg by mouth daily.     Marland Kitchen Propylene Glycol (SYSTANE COMPLETE OP) Place 1 drop into the left eye daily as needed (Dry eye).    . simvastatin (ZOCOR) 80 MG tablet Take 80 mg by mouth at bedtime.     No current facility-administered medications for this visit.    REVIEW OF SYSTEMS:   [X]  denotes positive finding, [ ]  denotes negative finding Cardiac  Comments:  Chest pain or chest pressure:    Shortness of breath upon exertion:    Short of breath when lying flat:    Irregular heart rhythm:        Vascular    Pain in calf, thigh, or hip brought on by ambulation:     Pain in feet at night that wakes you up from your sleep:     Blood clot in your veins:    Leg swelling:         Pulmonary    Oxygen at home:    Productive cough:     Wheezing:         Neurologic    Sudden weakness in arms or legs:     Sudden numbness in arms or legs:     Sudden onset of difficulty speaking or slurred speech:    Temporary loss of vision in one eye:     Problems with dizziness:         Gastrointestinal    Blood in stool:     Vomited blood:         Genitourinary    Burning when urinating:     Blood in urine:        Psychiatric    Major depression:         Hematologic    Bleeding problems:    Problems with blood clotting too easily:        Skin    Rashes or ulcers:        Constitutional    Fever or chills:      PHYSICAL EXAM:   Vitals:   07/02/19 1559  BP: (!) 152/72  Pulse: 68  Resp: 16  SpO2: 99%  Weight: 142 lb (64.4 kg)  Height: 5\' 3"  (1.6 m)    GENERAL: The patient is a well-nourished female, in no acute distress. The vital signs are documented above. CARDIAC: There is a regular rate and rhythm.  VASCULAR: Nonpalpable pedal pulses PULMONARY: Non-labored respirations  MUSCULOSKELETAL: There are no major deformities or cyanosis. NEUROLOGIC: No focal weakness or paresthesias are detected. SKIN: There are no ulcers or rashes noted. PSYCHIATRIC: The patient has a normal affect.  STUDIES:   I have ordered and reviewed the following: +-------+-----------+-----------+------------+------------+  ABI/TBIToday's ABIToday's TBIPrevious ABIPrevious TBI  +-------+-----------+-----------+------------+------------+  Right 0.87    0.58                  +-------+-----------+-----------+------------+------------+  Left  0.98    0.44                  +-------+-----------+-----------+------------+------------+    Right: 50-74% stenosis in the distal CFA and origin SFA. Patent SFA stent.   Tibial vessel disease.   MEDICAL ISSUES:   Carotid: She will have a duplex in 6 months.  Her most recent ultrasound shows 60 to 79% left carotid stenosis  Left renal stent: Surveillance imaging in 6 months  Bilateral lower extremity atherosclerotic vascular disease: Status post stenting on the right and bypass on the left.  Her most recent intervention appears to be widely patent on the right.  She will follow-up in 6 months with ABIs and duplex.    Charlena Cross, MD, FACS [facility] [phone] Pager [phone]

## 2019-07-04 ENCOUNTER — Other Ambulatory Visit: Payer: Self-pay | Admitting: *Deleted

## 2019-07-04 DIAGNOSIS — I779 Disorder of arteries and arterioles, unspecified: Secondary | ICD-10-CM

## 2019-07-04 DIAGNOSIS — I6523 Occlusion and stenosis of bilateral carotid arteries: Secondary | ICD-10-CM

## 2019-07-04 DIAGNOSIS — I70213 Atherosclerosis of native arteries of extremities with intermittent claudication, bilateral legs: Secondary | ICD-10-CM

## 2019-07-04 DIAGNOSIS — Z95828 Presence of other vascular implants and grafts: Secondary | ICD-10-CM

## 2019-07-04 DIAGNOSIS — I739 Peripheral vascular disease, unspecified: Secondary | ICD-10-CM

## 2019-07-04 DIAGNOSIS — I701 Atherosclerosis of renal artery: Secondary | ICD-10-CM

## 2020-01-01 ENCOUNTER — Ambulatory Visit (HOSPITAL_COMMUNITY)
Admission: RE | Admit: 2020-01-01 | Discharge: 2020-01-01 | Disposition: A | Discharge status: Home / Self Care | Payer: Medicare Other | Source: Ambulatory Visit | Attending: Physician Assistant | Admitting: Physician Assistant

## 2020-01-01 ENCOUNTER — Ambulatory Visit (INDEPENDENT_AMBULATORY_CARE_PROVIDER_SITE_OTHER)
Admission: RE | Admit: 2020-01-01 | Discharge: 2020-01-01 | Disposition: A | Discharge status: Home / Self Care | Payer: Medicare Other | Source: Ambulatory Visit | Attending: Physician Assistant | Admitting: Physician Assistant

## 2020-01-01 ENCOUNTER — Ambulatory Visit (INDEPENDENT_AMBULATORY_CARE_PROVIDER_SITE_OTHER): Payer: Medicare Other | Admitting: Physician Assistant

## 2020-01-01 ENCOUNTER — Other Ambulatory Visit: Payer: Self-pay

## 2020-01-01 VITALS — BP 156/80 | HR 72 | Temp 98.5°F | Resp 20 | Ht 63.0 in | Wt 139.5 lb

## 2020-01-01 DIAGNOSIS — I70213 Atherosclerosis of native arteries of extremities with intermittent claudication, bilateral legs: Secondary | ICD-10-CM

## 2020-01-01 DIAGNOSIS — Z95828 Presence of other vascular implants and grafts: Secondary | ICD-10-CM

## 2020-01-01 DIAGNOSIS — I739 Peripheral vascular disease, unspecified: Secondary | ICD-10-CM

## 2020-01-01 DIAGNOSIS — I779 Disorder of arteries and arterioles, unspecified: Secondary | ICD-10-CM

## 2020-01-01 DIAGNOSIS — I701 Atherosclerosis of renal artery: Secondary | ICD-10-CM

## 2020-01-01 DIAGNOSIS — I6523 Occlusion and stenosis of bilateral carotid arteries: Secondary | ICD-10-CM

## 2020-01-01 NOTE — Progress Notes (Addendum)
Office Note     CC:  follow up Requesting Provider:  Marinell BlightSmith, Pamela, MD  HPI: [name] is a 79 y.o. (10/19/1940) female who presents to go over vascular studies related to carotid artery stenosis, PAD, and renal artery stenosis.  She has a history of a CVA in 2008 with unknown origin.  Last office visit, carotid duplex demonstrated right ICA 1 to 39% stenosis and left ICA 60 to 79% stenosis.  She denies any diagnosis of TIA or CVA since last office visit.  She also denies any current strokelike symptoms including slurring speech, changes in vision, or one-sided weakness.  She is also followed for peripheral arterial disease with extensive vascular surgery history with Dr. Myra GianottiBrabham.  In the past she has had a left SFA stent in 03/11/2005, a left popliteal stent in 05/01/2009, and a left femoropopliteal bypass using vein on 10/12/2012.  On the right she has had a right SFA stents on 10/17/2009.  She was found to have in-stent stenosis and underwent laser atherectomy and balloon angioplasty of right SFA stent as well as native popliteal artery on 05/29/2019.  She denies any claudication, rest pain, or nonhealing wounds of bilateral lower extremities.  She admittedly does not walk much however is able to walk up and down the grocery aisles without having to stop to rest.  During her aortogram on 05/29/2019 she was noted to have an occluded right renal artery and significant 90% stenosis of the origin of left renal artery.  She was brought back on 06/19/2019 and underwent left renal artery stent.  She denies any new medications or dose adjustments for her antihypertensive regimen since last office visit.  She also denies any change in renal function.  She is scheduled for routine lab work with her PCP next month.  She continues to take an aspirin, Plavix, and statin daily.  She continues to use tobacco daily.    Past Medical History:  Diagnosis Date  . Carotid artery occlusion   . Coronary artery  disease   . Diabetes mellitus    borderline , no longer treated /w medicine   . Hyperlipidemia   . Hypertension   . Peripheral vascular disease (HCC)    emboli of left foot from left superficial femoral and popliteal occulsive disease  . Stroke [facility](HCC) 2008    Past Surgical History:  Procedure Laterality Date  . ABDOMINAL AORTAGRAM N/A 10/03/2012   Procedure: ABDOMINAL Ronny FlurryAORTAGRAM;  Surgeon: Nada LibmanVance W Brabham, MD;  [facility];  Service: Cardiovascular;  Laterality: N/A;  . ABDOMINAL AORTOGRAM N/A 06/19/2019   Procedure: ABDOMINAL AORTOGRAM;  Surgeon: Nada LibmanBrabham, Vance W, MD;  Location: [facility] INVASIVE CV LAB;  Service: Cardiovascular;  Laterality: N/A;  . ABDOMINAL AORTOGRAM W/LOWER EXTREMITY Bilateral 05/29/2019   Procedure: ABDOMINAL AORTOGRAM W/LOWER EXTREMITY;  Surgeon: Nada LibmanBrabham, Vance W, MD;  Location: [facility] INVASIVE CV LAB;  Service: Cardiovascular;  Laterality: Bilateral;  . FEMORAL-POPLITEAL BYPASS GRAFT Left 10/12/2012   Procedure: BYPASS GRAFT FEMORAL-POPLITEAL ARTERY;  Surgeon: Pryor OchoaJames D Lawson, MD;  [facility];  Service: Vascular;  Laterality: Left;  . INGUINAL HERNIA REPAIR     right  . left popliteal stent   04-30-2009  . LOWER EXTREMITY ANGIOGRAM Bilateral 10/03/2012   Procedure: LOWER EXTREMITY ANGIOGRAM;  Surgeon: Nada LibmanVance W Brabham, MD;  [facility];  Service: Cardiovascular;  Laterality: Bilateral;  . PERIPHERAL VASCULAR ATHERECTOMY Right 05/29/2019   Procedure: PERIPHERAL VASCULAR ATHERECTOMY;  Surgeon: Nada LibmanBrabham, Vance W, MD;  [facility]  [facility];  Service: Cardiovascular;  Laterality: Right;  laser  SFA  . PERIPHERAL VASCULAR BALLOON ANGIOPLASTY Right 05/29/2019   Procedure: PERIPHERAL VASCULAR BALLOON ANGIOPLASTY;  Surgeon: Nada Libman, MD;  Location: [facility] INVASIVE CV LAB;  Service: Cardiovascular;  Laterality: Right;  SFA  . PERIPHERAL VASCULAR INTERVENTION Left 06/19/2019   Procedure: PERIPHERAL VASCULAR INTERVENTION;  Surgeon: Nada Libman, MD;   Location: [facility] INVASIVE CV LAB;  Service: Cardiovascular;  Laterality: Left;  RENAL  . right SFA stent   2010  . SFA STENT  04-2005   LEFT  . TUBAL LIGATION      Social History   Socioeconomic History  . Marital status: Married    Spouse name: Not on file  . Number of children: Not on file  . Years of education: Not on file  . Highest education level: Not on file  Occupational History  . Not on file  Tobacco Use  . Smoking status: Light Tobacco Smoker    Packs/day: 0.25    Years: 50.00    Pack years: 12.50    Types: Cigarettes  . Smokeless tobacco: Never Used  . Tobacco comment: pt states she smokes about 4-5 cigs per day and is trying to quit  Vaping Use  . Vaping Use: Never used  Substance and Sexual Activity  . Alcohol use: No  . Drug use: No  . Sexual activity: Not on file  Other Topics Concern  . Not on file  Social History Narrative  . Not on file   Social Determinants of Health   Financial Resource Strain: Not on file  Food Insecurity: Not on file  Transportation Needs: Not on file  Physical Activity: Not on file  Stress: Not on file  Social Connections: Not on file  Intimate Partner Violence: Not on file    Family History  Problem Relation Age of Onset  . Coronary artery disease Mother   . Heart disease Mother        After age 27  . Heart attack Mother   . Coronary artery disease Sister   . Heart disease Sister        Before  age 30 -  Heart Transplant  . Cancer Sister        Kidney  . Heart attack Sister   . Heart disease Father        After age 3  . Other Father        amputation    Current Outpatient Medications  Medication Sig Dispense Refill  . amLODipine (NORVASC) 5 MG tablet Take by mouth.    Marland Kitchen aspirin 81 MG tablet Take 81 mg by mouth daily.    . Cholecalciferol (VITAMIN D3) 50 MCG (2000 UT) TABS Take 2,000 Units by mouth 4 (four) times daily.    . clopidogrel (PLAVIX) 75 MG tablet Take 75 mg by mouth daily.    . famotidine (PEPCID)  20 MG tablet Take 20 mg by mouth daily as needed for heartburn.     . losartan (COZAAR) 50 MG tablet Take 50 mg by mouth Daily.    . metFORMIN (GLUCOPHAGE-XR) 500 MG 24 hr tablet Take 500 mg by mouth daily.     . Omega-3 Fatty Acids (FISH OIL) 1000 MG CAPS Take by mouth.    . Propylene Glycol (SYSTANE COMPLETE OP) Place 1 drop into the left eye daily as needed (Dry eye).    . simvastatin (ZOCOR) 80 MG tablet Take 80 mg by mouth at bedtime.  No current facility-administered medications for this visit.    No Known Allergies   REVIEW OF SYSTEMS:   [X]  denotes positive finding, [ ]  denotes negative finding Cardiac  Comments:  Chest pain or chest pressure:    Shortness of breath upon exertion:    Short of breath when lying flat:    Irregular heart rhythm:        Vascular    Pain in calf, thigh, or hip brought on by ambulation:    Pain in feet at night that wakes you up from your sleep:     Blood clot in your veins:    Leg swelling:         Pulmonary    Oxygen at home:    Productive cough:     Wheezing:         Neurologic    Sudden weakness in arms or legs:     Sudden numbness in arms or legs:     Sudden onset of difficulty speaking or slurred speech:    Temporary loss of vision in one eye:     Problems with dizziness:         Gastrointestinal    Blood in stool:     Vomited blood:         Genitourinary    Burning when urinating:     Blood in urine:        Psychiatric    Major depression:         Hematologic    Bleeding problems:    Problems with blood clotting too easily:        Skin    Rashes or ulcers:        Constitutional    Fever or chills:      PHYSICAL EXAMINATION:  Vitals:   01/01/20 1026 01/01/20 1028  BP: (!) 152/69 (!) 156/80  Pulse: 72   Resp: 20   Temp: 98.5 F (36.9 C)   TempSrc: Temporal   SpO2: 98%   Weight: 139 lb 8 oz (63.3 kg)   Height: 5\' 3"  (1.6 m)     General:  WDWN in NAD; vital signs documented above Gait: Not  observed HENT: WNL, normocephalic Pulmonary: normal non-labored breathing  Cardiac: regular HR Abdomen: soft, NT, no masses Skin: without rashes Vascular Exam/Pulses:  Right Left  Radial 2+ (normal) 2+ (normal)  DP absent absent  PT absent absent   Extremities: without ischemic changes, without Gangrene , without cellulitis; without open wounds;  Musculoskeletal: no muscle wasting or atrophy  Neurologic: A&O X 3;  No focal weakness or paresthesias are detected Psychiatric:  The pt has Normal affect.   Non-Invasive Vascular Imaging:   Carotid duplex: Right ICA stenosis 1 to 39% left ICA stenosis 60 to 79%  Left renal artery duplex: Greater than 60% stenosis noted in the origin and proximal renal artery segments  Bilateral lower extremity arterial duplex: R Patent right SFA stent  L Patent left femoral to popliteal bypass with no flow-limiting stenosis Tibial vessel disease noted bilaterally  ABI ABI/TBIToday's ABIToday's TBIPrevious ABIPrevious TBI  +-------+-----------+-----------+------------+------------+  Right 0.59    0.31    0.87    0.58      +-------+-----------+-----------+------------+------------+  Left  0.69    0     0.98    0.44         ASSESSMENT/PLAN:: 79 y.o. female here for follow up for surveillance of carotid artery stenosis, renal artery stenosis, and PAD involving bilateral lower extremities  -  Carotid duplex demonstrates 1 to 39% stenosis of right ICA and 60 to 79% stenosis of left ICA; left ICA stenosis is asymptomatic -No indication for revascularization currently; recheck carotid duplex in 6 months  -Recurrent stenosis of left renal artery based on duplex however no subjective change in blood pressure control or renal function -Findings will be discussed with Dr. Myra Gianotti however for now we will plan to repeat renal artery duplex in 6 months -Routine lab work per his PCP scheduled for next  month  -Lower extremity arterial duplex demonstrates a patent right SFA as well as a patent left femoral to popliteal bypass without any flow limiting stenosis; despite these findings she had a mild drop in bilateral ABIs.  Patient denies any claudication and rest pain and there were no nonhealing wounds noted on physical exam; plan will be to proceed conservatively and recheck bilateral lower extremity arterial duplex with ABIs in 6 months -Again encouraged smoking cessation -Continue aspirin, Plavix, statin daily -Call/return office sooner with any questions or concerns involving the above results as well as any development of symptoms  ADDENDUM 01/10/20: Case discussed with Dr. Myra Gianotti, would consider angiogram for renal artery stenosis if velocity profile worsens  Felicia Rutter, PA-C [facility] [phone]  Clinic MD:   Lenell Antu

## 2020-01-02 ENCOUNTER — Other Ambulatory Visit: Payer: Self-pay

## 2020-01-02 DIAGNOSIS — I6523 Occlusion and stenosis of bilateral carotid arteries: Secondary | ICD-10-CM

## 2020-01-02 DIAGNOSIS — I70213 Atherosclerosis of native arteries of extremities with intermittent claudication, bilateral legs: Secondary | ICD-10-CM

## 2020-05-20 ENCOUNTER — Other Ambulatory Visit: Payer: Self-pay

## 2020-05-20 DIAGNOSIS — I6523 Occlusion and stenosis of bilateral carotid arteries: Secondary | ICD-10-CM

## 2020-05-20 DIAGNOSIS — I70213 Atherosclerosis of native arteries of extremities with intermittent claudication, bilateral legs: Secondary | ICD-10-CM

## 2020-05-20 DIAGNOSIS — I701 Atherosclerosis of renal artery: Secondary | ICD-10-CM

## 2020-06-27 ENCOUNTER — Other Ambulatory Visit: Payer: Self-pay

## 2020-06-27 ENCOUNTER — Ambulatory Visit (HOSPITAL_COMMUNITY)
Admission: RE | Admit: 2020-06-27 | Discharge: 2020-06-27 | Disposition: A | Discharge status: Home / Self Care | Payer: Medicare Other | Source: Ambulatory Visit | Attending: Surgery | Admitting: Surgery

## 2020-06-27 DIAGNOSIS — I70213 Atherosclerosis of native arteries of extremities with intermittent claudication, bilateral legs: Secondary | ICD-10-CM

## 2020-06-30 ENCOUNTER — Ambulatory Visit (INDEPENDENT_AMBULATORY_CARE_PROVIDER_SITE_OTHER)
Admission: RE | Admit: 2020-06-30 | Discharge: 2020-06-30 | Disposition: A | Discharge status: Home / Self Care | Payer: Medicare Other | Source: Ambulatory Visit | Attending: Surgery | Admitting: Surgery

## 2020-06-30 ENCOUNTER — Ambulatory Visit (HOSPITAL_COMMUNITY)
Admission: RE | Admit: 2020-06-30 | Discharge: 2020-06-30 | Disposition: A | Discharge status: Home / Self Care | Payer: Medicare Other | Source: Ambulatory Visit | Attending: Surgery | Admitting: Surgery

## 2020-06-30 ENCOUNTER — Other Ambulatory Visit: Payer: Self-pay

## 2020-06-30 ENCOUNTER — Ambulatory Visit (INDEPENDENT_AMBULATORY_CARE_PROVIDER_SITE_OTHER): Payer: Medicare Other | Admitting: Surgery

## 2020-06-30 ENCOUNTER — Encounter: Payer: Self-pay | Admitting: Surgery

## 2020-06-30 VITALS — BP 163/75 | HR 66 | Temp 97.6°F | Resp 20 | Ht 63.0 in | Wt 141.0 lb

## 2020-06-30 DIAGNOSIS — I70213 Atherosclerosis of native arteries of extremities with intermittent claudication, bilateral legs: Secondary | ICD-10-CM

## 2020-06-30 DIAGNOSIS — I6523 Occlusion and stenosis of bilateral carotid arteries: Secondary | ICD-10-CM | POA: Insufficient documentation

## 2020-06-30 DIAGNOSIS — I701 Atherosclerosis of renal artery: Secondary | ICD-10-CM

## 2020-06-30 NOTE — Progress Notes (Signed)
[facility] [place]  Patient name: [name] MRN: [id number] DOB: September 20, 1940 Sex: female   REASON FOR VISIT:    Follow up  HISOTRY OF PRESENT ILLNESS:   [name] is a 80 y.o. female,  who is s/p left femoropopliteal artery bypass graft using saphenous vein on 10/12/2012, left superficial femoral artery stent 03/11/2005, left popliteal artery stent 04/30/2009, and right superficial femoral artery stent on 10/17/2009.  At her last visit, she had a drop in her ABIs and was scheduled for angiography.  This occurred on 05/29/2019.  She was found to have in-stent stenosis within the right superficial femoral artery which was greater than 90%.  I treated this with laser atherectomy and drug-coated balloon angioplasty.  There was a native popliteal artery stenosis that was treated with laser atherectomy and drug-coated balloon angioplasty.  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    The patient continues to take dual antiplatelet therapy.  She is on Zocor for hypercholesterolemia.  She takes an ARB for hypertension.   PAST MEDICAL HISTORY:   Past Medical History:  Diagnosis Date   Carotid artery occlusion    Coronary artery disease    Diabetes mellitus    borderline , no longer treated /w medicine    Hyperlipidemia    Hypertension    Peripheral vascular disease (HCC)    emboli of left foot from left superficial femoral and popliteal occulsive disease   Stroke [facility]) 2008     FAMILY HISTORY:   Family History  Problem Relation Age of Onset   Coronary artery disease Mother    Heart disease Mother        After age 53   Heart attack Mother    Coronary artery disease Sister    Heart disease Sister        Before  age 10 -  Heart Transplant   Cancer Sister        Kidney   Heart  attack Sister    Heart disease Father        After age 71   Other Father        amputation    SOCIAL HISTORY:   Social History   Tobacco Use   Smoking status: Light Smoker    Packs/day: 0.25    Years: 50.00    Pack years: 12.50    Types: Cigarettes   Smokeless tobacco: Never   Tobacco comments:    pt states she smokes about 4-5 cigs per day and is trying to quit  Substance Use Topics   Alcohol use: No     ALLERGIES:   No Known Allergies   CURRENT MEDICATIONS:   Current Outpatient Medications  Medication Sig Dispense Refill   amLODipine (NORVASC) 5 MG tablet Take by mouth.     aspirin 81 MG tablet Take 81 mg by mouth daily.     Cholecalciferol (VITAMIN D3) 50 MCG (2000 UT) TABS Take 2,000 Units by mouth 4 (four) times daily.     clopidogrel (PLAVIX) 75 MG tablet Take 75 mg by mouth daily.     famotidine (PEPCID) 20 MG tablet Take 20 mg by mouth daily as needed for heartburn.      losartan (COZAAR) 50 MG tablet Take 50 mg by mouth Daily.     metFORMIN (GLUCOPHAGE-XR) 500 MG  24 hr tablet Take 500 mg by mouth daily.      Omega-3 Fatty Acids (FISH OIL) 1000 MG CAPS Take by mouth.     Propylene Glycol (SYSTANE COMPLETE OP) Place 1 drop into the left eye daily as needed (Dry eye).     simvastatin (ZOCOR) 80 MG tablet Take 80 mg by mouth at bedtime.     No current facility-administered medications for this visit.    REVIEW OF SYSTEMS:   [X]  denotes positive finding, [ ]  denotes negative finding Cardiac  Comments:  Chest pain or chest pressure:    Shortness of breath upon exertion:    Short of breath when lying flat:    Irregular heart rhythm:        Vascular    Pain in calf, thigh, or hip brought on by ambulation:    Pain in feet at night that wakes you up from your sleep:     Blood clot in your veins:    Leg swelling:         Pulmonary    Oxygen at home:    Productive cough:     Wheezing:         Neurologic    Sudden weakness in arms or legs:     Sudden  numbness in arms or legs:     Sudden onset of difficulty speaking or slurred speech:    Temporary loss of vision in one eye:     Problems with dizziness:         Gastrointestinal    Blood in stool:     Vomited blood:         Genitourinary    Burning when urinating:     Blood in urine:        Psychiatric    Major depression:         Hematologic    Bleeding problems:    Problems with blood clotting too easily:        Skin    Rashes or ulcers:        Constitutional    Fever or chills:      PHYSICAL EXAM:   There were no vitals filed for this visit.  GENERAL: The patient is a well-nourished female, in no acute distress. The vital signs are documented above. CARDIAC: There is a regular rate and rhythm.  VASCULAR: Nonpalpable pedal pulses.  Left leg edema PULMONARY: Non-labored respirations MUSCULOSKELETAL: There are no major deformities or cyanosis. NEUROLOGIC: No focal weakness or paresthesias are detected. SKIN: There are no ulcers or rashes noted. PSYCHIATRIC: The patient has a normal affect.  STUDIES:   I have reviewed the following vascular lab studies: Renal: Left: Normal size of left kidney. Left renal artery stent was not        clearly visualized, however >60% stenosis is noted in the        origin and proximal renal artery segments.   ABI/TBIToday's ABIToday's TBIPrevious ABIPrevious TBI  +-------+-----------+-----------+------------+------------+  Right  0.59       0.35       0.59        0.31          +-------+-----------+-----------+------------+------------+  Left   0.65       0.25       0.69        0             +-------+-----------+-----------+------------+------------+   Right Carotid: Velocities in the right ICA are consistent with a  40-59%                 stenosis.   Left Carotid: Velocities in the left ICA are consistent with a 60-79%  stenosis.   Vertebrals: Bilateral vertebral arteries demonstrate antegrade flow.  MEDICAL  ISSUES:    Renal: Duplex impression suggest greater than 60% stenosis.  The velocity profile has not changed in 6 months.  I suspect that this is likely related to tortuosity rather than a true stenosis.  Therefore, I would just continue to follow this with ultrasound until there is a significant velocity change.  Carotid: She is asymptomatic.  We will repeat her ultrasound in 6 months  PAD: She has not had any significant change in her ABIs.  She will occasionally have claudication symptoms, but not on a daily basis.  I would like to get a duplex of her stents and bypass graft at her next visit   Charlena Cross, MD, FACS [facility] [phone] Pager [phone]

## 2020-07-02 ENCOUNTER — Other Ambulatory Visit: Payer: Self-pay

## 2020-07-02 DIAGNOSIS — I6523 Occlusion and stenosis of bilateral carotid arteries: Secondary | ICD-10-CM

## 2020-07-02 DIAGNOSIS — I701 Atherosclerosis of renal artery: Secondary | ICD-10-CM

## 2020-07-02 DIAGNOSIS — I779 Disorder of arteries and arterioles, unspecified: Secondary | ICD-10-CM

## 2020-12-31 DIAGNOSIS — J310 Chronic rhinitis: Secondary | ICD-10-CM | POA: Insufficient documentation

## 2020-12-31 DIAGNOSIS — E559 Vitamin D deficiency, unspecified: Secondary | ICD-10-CM | POA: Insufficient documentation

## 2020-12-31 DIAGNOSIS — N183 Chronic kidney disease, stage 3 unspecified: Secondary | ICD-10-CM | POA: Insufficient documentation

## 2020-12-31 DIAGNOSIS — I872 Venous insufficiency (chronic) (peripheral): Secondary | ICD-10-CM | POA: Insufficient documentation

## 2020-12-31 DIAGNOSIS — N959 Unspecified menopausal and perimenopausal disorder: Secondary | ICD-10-CM | POA: Insufficient documentation

## 2020-12-31 DIAGNOSIS — D72829 Elevated white blood cell count, unspecified: Secondary | ICD-10-CM | POA: Insufficient documentation

## 2020-12-31 DIAGNOSIS — E538 Deficiency of other specified B group vitamins: Secondary | ICD-10-CM | POA: Insufficient documentation

## 2020-12-31 DIAGNOSIS — I701 Atherosclerosis of renal artery: Secondary | ICD-10-CM | POA: Insufficient documentation

## 2020-12-31 DIAGNOSIS — F17200 Nicotine dependence, unspecified, uncomplicated: Secondary | ICD-10-CM | POA: Insufficient documentation

## 2020-12-31 DIAGNOSIS — Z9889 Other specified postprocedural states: Secondary | ICD-10-CM | POA: Insufficient documentation

## 2020-12-31 DIAGNOSIS — R011 Cardiac murmur, unspecified: Secondary | ICD-10-CM | POA: Insufficient documentation

## 2020-12-31 DIAGNOSIS — E781 Pure hyperglyceridemia: Secondary | ICD-10-CM | POA: Insufficient documentation

## 2020-12-31 DIAGNOSIS — G629 Polyneuropathy, unspecified: Secondary | ICD-10-CM | POA: Insufficient documentation

## 2020-12-31 DIAGNOSIS — Z1331 Encounter for screening for depression: Secondary | ICD-10-CM | POA: Insufficient documentation

## 2020-12-31 DIAGNOSIS — E875 Hyperkalemia: Secondary | ICD-10-CM | POA: Insufficient documentation

## 2021-01-01 ENCOUNTER — Other Ambulatory Visit: Payer: Self-pay

## 2021-01-01 ENCOUNTER — Ambulatory Visit (HOSPITAL_COMMUNITY)
Admission: RE | Admit: 2021-01-01 | Discharge: 2021-01-01 | Disposition: A | Discharge status: Home / Self Care | Payer: Medicare Other | Source: Ambulatory Visit | Attending: Surgery | Admitting: Surgery

## 2021-01-01 ENCOUNTER — Ambulatory Visit (INDEPENDENT_AMBULATORY_CARE_PROVIDER_SITE_OTHER)
Admission: RE | Admit: 2021-01-01 | Discharge: 2021-01-01 | Disposition: A | Discharge status: Home / Self Care | Payer: Medicare Other | Source: Ambulatory Visit | Attending: Surgery | Admitting: Surgery

## 2021-01-01 ENCOUNTER — Ambulatory Visit (INDEPENDENT_AMBULATORY_CARE_PROVIDER_SITE_OTHER): Payer: Medicare Other | Admitting: Physician Assistant

## 2021-01-01 VITALS — BP 148/69 | HR 80 | Temp 97.7°F | Resp 20 | Ht 63.0 in | Wt 141.0 lb

## 2021-01-01 DIAGNOSIS — I6523 Occlusion and stenosis of bilateral carotid arteries: Secondary | ICD-10-CM

## 2021-01-01 DIAGNOSIS — I779 Disorder of arteries and arterioles, unspecified: Secondary | ICD-10-CM | POA: Insufficient documentation

## 2021-01-01 DIAGNOSIS — I701 Atherosclerosis of renal artery: Secondary | ICD-10-CM

## 2021-01-01 DIAGNOSIS — I70213 Atherosclerosis of native arteries of extremities with intermittent claudication, bilateral legs: Secondary | ICD-10-CM | POA: Diagnosis not present

## 2021-01-01 NOTE — Progress Notes (Signed)
History of Present Illness:  Patient is a 80 y.o. year old female who presents for evaluation of carotid stenosis.  She has a distant history of TIA in 2008 with aphasia symptoms that went away.  The patient denies symptoms of TIA, amaurosis, or stroke.    History of PAD s/p left femoropopliteal artery bypass graft using saphenous vein on 10/12/2012, left superficial femoral artery stent 03/11/2005, left popliteal artery stent 04/30/2009, and right superficial femoral artery stent on 10/17/2009.  She had a drop in ABI's on 05/29/19 and underwent angiography with laser atherectomy and drug-coated balloon angioplasty.  There was a native popliteal artery stenosis that was treated with laser atherectomy and drug-coated balloon angioplasty.  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  She denise rest pain, claudication symptoms or non healing wounds.  She states her primary care physician follows her Kidney function with labs and it has been normal according to the patient.     The patient continues to take dual antiplatelet therapy.  She is on Zocor for hypercholesterolemia.  She takes an ARB for hypertension.  She continues to smoke on a daily basis.  Past Medical History:  Diagnosis Date   Carotid artery occlusion    Coronary artery disease    Diabetes mellitus    borderline , no longer treated /w medicine    Hyperlipidemia    Hypertension    Peripheral vascular disease (HCC)    emboli of left foot from left superficial femoral and popliteal occulsive disease   Stroke [facility]) 2008    Past Surgical History:  Procedure Laterality Date   ABDOMINAL AORTAGRAM N/A 10/03/2012   Procedure: ABDOMINAL Ronny Flurry;  Surgeon: Nada Libman, MD;  Location: [facility] CATH LAB;  Service: Cardiovascular;  Laterality: N/A;   ABDOMINAL AORTOGRAM N/A 06/19/2019   Procedure:  ABDOMINAL AORTOGRAM;  Surgeon: Nada Libman, MD;  Location: [facility] INVASIVE CV LAB;  Service: Cardiovascular;  Laterality: N/A;   ABDOMINAL AORTOGRAM W/LOWER EXTREMITY Bilateral 05/29/2019   Procedure: ABDOMINAL AORTOGRAM W/LOWER EXTREMITY;  Surgeon: Nada Libman, MD;  Location: [facility] INVASIVE CV LAB;  Service: Cardiovascular;  Laterality: Bilateral;   FEMORAL-POPLITEAL BYPASS GRAFT Left 10/12/2012   Procedure: BYPASS GRAFT FEMORAL-POPLITEAL ARTERY;  Surgeon: Pryor Ochoa, MD;  Location: [facility] OR;  Service: Vascular;  Laterality: Left;   INGUINAL HERNIA REPAIR     right   left popliteal stent   04-30-2009   LOWER EXTREMITY ANGIOGRAM Bilateral 10/03/2012   Procedure: LOWER EXTREMITY ANGIOGRAM;  Surgeon: Nada Libman, MD;  Location: [facility] CATH LAB;  Service: Cardiovascular;  Laterality: Bilateral;   PERIPHERAL VASCULAR ATHERECTOMY Right 05/29/2019   Procedure: PERIPHERAL VASCULAR ATHERECTOMY;  Surgeon: Nada Libman, MD;  Location: [facility] INVASIVE CV LAB;  Service: Cardiovascular;  Laterality: Right;  laser  SFA   PERIPHERAL VASCULAR BALLOON ANGIOPLASTY Right 05/29/2019   Procedure: PERIPHERAL VASCULAR BALLOON ANGIOPLASTY;  Surgeon: Nada Libman, MD;  Location: [facility] INVASIVE CV LAB;  Service: Cardiovascular;  Laterality: Right;  SFA   PERIPHERAL VASCULAR INTERVENTION Left 06/19/2019   Procedure: PERIPHERAL VASCULAR INTERVENTION;  Surgeon: Nada Libman, MD;  Location: [facility] INVASIVE CV LAB;  Service: Cardiovascular;  Laterality: Left;  RENAL   right SFA stent   2010   SFA STENT  04-2005   LEFT   TUBAL LIGATION  Social History Social History   Tobacco Use   Smoking status: Every Day    Packs/day: 0.50    Years: 50.00    Pack years: 25.00    Types: Cigarettes   Smokeless tobacco: Never  Vaping Use   Vaping Use: Never used  Substance Use Topics   Alcohol use: No   Drug use: No    Family History Family History  Problem Relation Age of Onset   Coronary artery disease Mother     Heart disease Mother        After age 70   Heart attack Mother    Coronary artery disease Sister    Heart disease Sister        Before  age 44 -  Heart Transplant   Cancer Sister        Kidney   Heart attack Sister    Heart disease Father        After age 93   Other Father        amputation    Allergies  No Known Allergies   Current Outpatient Medications  Medication Sig Dispense Refill   amLODipine (NORVASC) 5 MG tablet Take by mouth.     aspirin 81 MG tablet Take 81 mg by mouth daily.     Cholecalciferol (VITAMIN D3) 50 MCG (2000 UT) TABS Take 2,000 Units by mouth 4 (four) times daily.     clopidogrel (PLAVIX) 75 MG tablet Take 75 mg by mouth daily.     famotidine (PEPCID) 20 MG tablet Take 20 mg by mouth daily as needed for heartburn.      losartan (COZAAR) 50 MG tablet Take 50 mg by mouth Daily.     metFORMIN (GLUCOPHAGE-XR) 500 MG 24 hr tablet Take 500 mg by mouth daily.      Omega-3 Fatty Acids (FISH OIL) 1000 MG CAPS Take by mouth.     Propylene Glycol (SYSTANE COMPLETE OP) Place 1 drop into the left eye daily as needed (Dry eye).     simvastatin (ZOCOR) 80 MG tablet Take 80 mg by mouth at bedtime.     No current facility-administered medications for this visit.    ROS:   General:  No weight loss, Fever, chills  HEENT: No recent headaches, no nasal bleeding, no visual changes, no sore throat  Neurologic: No dizziness, blackouts, seizures. No recent symptoms of stroke or mini- stroke. No recent episodes of slurred speech, or temporary blindness.  Cardiac: No recent episodes of chest pain/pressure, no shortness of breath at rest.  No shortness of breath with exertion.  Denies history of atrial fibrillation or irregular heartbeat  Vascular: No history of rest pain in feet.  No history of claudication.  No history of non-healing ulcer, No history of DVT   Pulmonary: No home oxygen, no productive cough, no hemoptysis,  No asthma or wheezing  Musculoskeletal:  [ ]   Arthritis, [ ]  Low back pain,  [ ]  Joint pain  Hematologic:No history of hypercoagulable state.  No history of easy bleeding.  No history of anemia  Gastrointestinal: No hematochezia or melena,  No gastroesophageal reflux, no trouble swallowing  Urinary: [ ]  chronic Kidney disease, [ ]  on HD - [ ]  MWF or [ ]  TTHS, [ ]  Burning with urination, [ ]  Frequent urination, [ ]  Difficulty urinating;   Skin: No rashes  Psychological: No history of anxiety,  No history of depression   Physical Examination  Vitals:   01/01/21 1014 01/01/21 1016  BP: Marland Kitchen)  143/71 (!) 148/69  Pulse: 80   Resp: 20   Temp: 97.7 F (36.5 C)   TempSrc: Temporal   SpO2: 96%   Weight: 141 lb (64 kg)   Height:  (1.6 m)     Body mass index is 24.98 kg/m.  General:  Alert and oriented, no acute distress HEENT: Normal Neck: No bruit or JVD Pulmonary: inspiratory wheezing auscultation bilaterally Cardiac: Regular Rate and Rhythm with + murmur heard best right sternal border Gastrointestinal: Soft, non-tender, non-distended, no mass, no scars Skin: No rash  Musculoskeletal: No deformity or edema  Neurologic: Upper and lower extremity motor 5/5 and symmetric  DATA:     Duplex Findings:  +----------+--------+--------+------+--------+   Mesenteric PSV cm/s EDV cm/s Plaque Comments   +----------+--------+--------+------+--------+   Aorta Prox    73                               +----------+--------+--------+------+--------+      +-----------------+--------+--------+-------+   Left Renal Artery PSV cm/s EDV cm/s Comment   +-----------------+--------+--------+-------+   Origin              426       62              +-----------------+--------+--------+-------+   Proximal            413       67              +-----------------+--------+--------+-------+   Mid                 127       35              +-----------------+--------+--------+-------+   Distal              114       18               +-----------------+--------+--------+-------+   +------------+--------+--------+--+-----------+--------+--------+----+   Right Kidney PSV cm/s EDV cm/s RI Left Kidney PSV cm/s EDV cm/s RI     +------------+--------+--------+--+-----------+--------+--------+----+   Upper Pole                        Upper Pole                           +------------+--------+--------+--+-----------+--------+--------+----+   Mid                               Mid         53       13       0.76   +------------+--------+--------+--+-----------+--------+--------+----+   Lower Pole                        Lower Pole                           +------------+--------+--------+--+-----------+--------+--------+----+   Hilar                             Hilar                                +------------+--------+--------+--+-----------+--------+--------+----+   +------------------++------------------+-------+  Right Kidney        Left Kidney                  +------------------++------------------+-------+   RAR                 RAR                          +------------------++------------------+-------+   RAR (manual)        RAR (manual)       5.8       +------------------++------------------+-------+   Cortex              Cortex                       +------------------++------------------+-------+   Cortex thickness    Corex thickness    1.83 mm   +------------------++------------------+-------+   Kidney length (cm)  Kidney length (cm) 11.85     +------------------++------------------+-------+      Summary:  Renal:     Left: Normal size of left kidney. LRV flow present. Left renal        artery stent was not clearly visualized, however >60% stenosis        is noted in the origin and proximal renal artery segments.     +----------+--------+-----+---------------+--------+--------+   RIGHT      PSV cm/s Ratio Stenosis        Waveform Comments   +----------+--------+-----+---------------+--------+--------+    CFA Prox   112                            biphasic            +----------+--------+-----+---------------+--------+--------+   CFA Distal 82                             biphasic            +----------+--------+-----+---------------+--------+--------+   DFA        80                             biphasic            +----------+--------+-----+---------------+--------+--------+   SFA Prox   178            30-49% stenosis biphasic            +----------+--------+-----+---------------+--------+--------+   SFA Mid                                            stent      +----------+--------+-----+---------------+--------+--------+   SFA Distal                                         stent      +----------+--------+-----+---------------+--------+--------+   POP Prox   105                            biphasic            +----------+--------+-----+---------------+--------+--------+      Right Stent(s):  +---------------+--------+---------------+--------+--------+  SFA             PSV cm/s Stenosis        Waveform Comments   +---------------+--------+---------------+--------+--------+   Prox to Stent   68                       biphasic            +---------------+--------+---------------+--------+--------+   Proximal Stent  204      50-99% stenosis biphasic            +---------------+--------+---------------+--------+--------+   Mid Stent       35                       biphasic            +---------------+--------+---------------+--------+--------+   Distal Stent    37                       biphasic            +---------------+--------+---------------+--------+--------+   Distal to Stent 49                       biphasic            +---------------+--------+---------------+--------+--------+      Left Graft #1: Femoral-popliteal  +--------------------+--------+--------+--------+--------+                        PSV cm/s Stenosis Waveform Comments    +--------------------+--------+--------+--------+--------+   Inflow               118               biphasic            +--------------------+--------+--------+--------+--------+   Proximal Anastomosis 106               biphasic            +--------------------+--------+--------+--------+--------+   Proximal Graft       36                biphasic            +--------------------+--------+--------+--------+--------+   Mid Graft            44                biphasic            +--------------------+--------+--------+--------+--------+   Distal Graft         69                biphasic            +--------------------+--------+--------+--------+--------+   Distal Anastomosis   84                biphasic            +--------------------+--------+--------+--------+--------+   Outflow              63                biphasic            +--------------------+--------+--------+--------+--------+          Summary:  Right: 30-49% stenosis noted in the superficial femoral artery. Stenosis  is noted within the superficial femoral artery stent (elevated velocities  suggest 50-99% stenosis).   Left: Patent left femoral-popliteal bypass graft with no evidence of  stenosis.       ABI Findings:  +---------+------------------+-----+----------+--------+   Right     Rt Pressure (mmHg) Index Waveform   Comment    +---------+------------------+-----+----------+--------+   Brachial  165                                            +---------+------------------+-----+----------+--------+   PTA       100                0.61  monophasic            +---------+------------------+-----+----------+--------+   DP        105                0.64  monophasic            +---------+------------------+-----+----------+--------+   Great Toe 60                 0.36                        +---------+------------------+-----+----------+--------+   +---------+------------------+-----+----------+-------+   Left       Lt Pressure (mmHg) Index Waveform   Comment   +---------+------------------+-----+----------+-------+   Brachial  163                                           +---------+------------------+-----+----------+-------+   PTA       129                0.78  biphasic             +---------+------------------+-----+----------+-------+   DP        130                0.79  monophasic           +---------+------------------+-----+----------+-------+   Great Toe 40                 0.24                       +---------+------------------+-----+----------+-------+   +-------+-----------+-----------+------------+------------+   ABI/TBI Today's ABI Today's TBI Previous ABI Previous TBI   +-------+-----------+-----------+------------+------------+   Right   0.64        0.36        0.59         0.35           +-------+-----------+-----------+------------+------------+   Left    0.79        0.24        0.65         0.25           +-------+-----------+-----------+------------+------------+          Summary:  Right: Resting right ankle-brachial index indicates moderate right lower  extremity arterial disease. The right toe-brachial index is abnormal.   Left: Resting left ankle-brachial index indicates moderate left lower  extremity arterial disease. The left toe-brachial index is abnormal.       Right Carotid Findings:  +----------+--------+--------+--------+-------------------------+--------+              PSV cm/s EDV cm/s Stenosis Plaque Description        Comments   +----------+--------+--------+--------+-------------------------+--------+  CCA Prox   71       12                homogeneous                          +----------+--------+--------+--------+-------------------------+--------+   CCA Mid    70       13                                                     +----------+--------+--------+--------+-------------------------+--------+   CCA Distal 65       14                heterogenous and  calcific            +----------+--------+--------+--------+-------------------------+--------+   ICA Prox   159      43       40-59%   heterogenous and calcific            +----------+--------+--------+--------+-------------------------+--------+   ICA Mid    130      33                                                     +----------+--------+--------+--------+-------------------------+--------+   ICA Distal 123      34                                                     +----------+--------+--------+--------+-------------------------+--------+   ECA        148      0                 heterogenous                         +----------+--------+--------+--------+-------------------------+--------+   +----------+--------+-------+---------+-------------------+              PSV cm/s EDV cms Describe  Arm Pressure (mmHG)   +----------+--------+-------+---------+-------------------+   Subclavian 334              Turbulent                       +----------+--------+-------+---------+-------------------+   +---------+--------+--+--------+--+---------+   Vertebral PSV cm/s 62 EDV cm/s 13 Antegrade   +---------+--------+--+--------+--+---------+       Left Carotid Findings:  +----------+--------+--------+--------+-------------------------+--------+              PSV cm/s EDV cm/s Stenosis Plaque Description        Comments   +----------+--------+--------+--------+-------------------------+--------+   CCA Prox   53       9                 heterogenous                         +----------+--------+--------+--------+-------------------------+--------+   CCA Mid    103      24  heterogenous                         +----------+--------+--------+--------+-------------------------+--------+   CCA Distal 119      24                heterogenous                         +----------+--------+--------+--------+-------------------------+--------+   ICA Prox   435      120      80-99%   heterogenous  and calcific            +----------+--------+--------+--------+-------------------------+--------+   ICA Mid    102      31                                                     +----------+--------+--------+--------+-------------------------+--------+   ICA Distal 79       25                                                     +----------+--------+--------+--------+-------------------------+--------+   ECA        111      8                 heterogenous                         +----------+--------+--------+--------+-------------------------+--------+   +----------+--------+--------+----------------+-------------------+              PSV cm/s EDV cm/s Describe         Arm Pressure (mmHG)   +----------+--------+--------+----------------+-------------------+   Subclavian 273               Multiphasic, WNL                       +----------+--------+--------+----------------+-------------------+   +---------+--------+--+--------+--+---------+   Vertebral PSV cm/s 61 EDV cm/s 19 Antegrade   +---------+--------+--+--------+--+---------+           Summary:  Right Carotid: Velocities in the right ICA are consistent with a 40-59%                 stenosis.   Left Carotid: Velocities in the left ICA are consistent with a 80-99%  stenosis.   Vertebrals:  Bilateral vertebral arteries demonstrate antegrade flow.  Subclavians: Right subclavian artery flow was disturbed. Normal flow               hemodynamics were seen in the left subclavian artery.   ASSESSMENT:  Renal: Duplex impression suggest greater than 60% stenosis.  This has not changed in 1 year. We will continue to follow this.  Carotids Showed increased Left Carotid: Velocities in the left ICA are consistent with a 80-99%  stenosis.   She remains asymptomatic with history of left TIA in 2008.  I will order a CTA and have her f/u with Dr. Myra Gianotti to discuss possible CEA.  PAD: She has not had any significant change in her ABIs. Her  Bypass duplex has not changed.  She denise new symptoms of ischemia in B  LE.  PLAN: F/U with DR. Brabham in next 4 weeks after CTA neck.  Continue maximum medical therapy with dual antiplatelet and statin.  I also advised her to stop smoking.  Mosetta Pigeon PA-C [facility] Office: [phone]  MD on Call [place]

## 2021-02-05 ENCOUNTER — Other Ambulatory Visit: Payer: Self-pay

## 2021-02-05 ENCOUNTER — Ambulatory Visit (HOSPITAL_COMMUNITY)
Admission: RE | Admit: 2021-02-05 | Discharge: 2021-02-05 | Disposition: A | Discharge status: Home / Self Care | Payer: Medicare Other | Source: Ambulatory Visit | Attending: Surgery | Admitting: Surgery

## 2021-02-05 DIAGNOSIS — I6523 Occlusion and stenosis of bilateral carotid arteries: Secondary | ICD-10-CM | POA: Insufficient documentation

## 2021-02-05 LAB — POCT I-STAT CREATININE: Creatinine, Ser: 1.3 mg/dL — ABNORMAL HIGH (ref 0.44–1.00)

## 2021-02-05 MED ORDER — IOHEXOL 350 MG/ML SOLN
80.0000 mL | Freq: Once | INTRAVENOUS | Status: AC | PRN
  Administered 2021-02-05: 60 mL via INTRAVENOUS

## 2021-02-09 ENCOUNTER — Encounter: Payer: Self-pay | Admitting: Surgery

## 2021-02-09 ENCOUNTER — Other Ambulatory Visit: Payer: Self-pay

## 2021-02-09 ENCOUNTER — Ambulatory Visit (INDEPENDENT_AMBULATORY_CARE_PROVIDER_SITE_OTHER): Payer: Medicare Other | Admitting: Surgery

## 2021-02-09 VITALS — BP 139/68 | HR 67 | Temp 97.4°F | Resp 16 | Ht 63.0 in | Wt 138.0 lb

## 2021-02-09 DIAGNOSIS — I6523 Occlusion and stenosis of bilateral carotid arteries: Secondary | ICD-10-CM | POA: Diagnosis not present

## 2021-02-09 DIAGNOSIS — I70213 Atherosclerosis of native arteries of extremities with intermittent claudication, bilateral legs: Secondary | ICD-10-CM

## 2021-02-09 NOTE — Progress Notes (Addendum)
° °[facility] [place] ° °Patient name: [name] MRN: [id number] DOB: 05/01/1940 Sex: female ° ° °REASON FOR VISIT:  ° ° °Follow up ° °HISOTRY OF PRESENT ILLNESS:  ° ° °[name] is a 81 y.o. female,  who is s/p left femoropopliteal artery bypass graft using saphenous vein on 10/12/2012, left superficial femoral artery stent 03/11/2005, left popliteal artery stent 04/30/2009, and right superficial femoral artery stent on 10/17/2009.  At her last visit, she had a drop in her ABIs and was scheduled for angiography.  This occurred on 05/29/2019.  She was found to have in-stent stenosis within the right superficial femoral artery which was greater than 90%.  I treated this with laser atherectomy and drug-coated balloon angioplasty.  There was a native popliteal artery stenosis that was treated with laser atherectomy and drug-coated balloon angioplasty.  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 ° °She is without claudication or rest pain, or nonhealing wounds.  Her renal function has been stable.  Her most recent carotid duplex suggested greater than 80% left carotid stenosis.  She was sent for CT scan and is back today for further discussions. ° ° ° °  °The patient continues to take dual antiplatelet therapy.  She is on Zocor for hypercholesterolemia.  She takes an ARB for hypertension. 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  ° Stroke (HCC) 2008  ° ° ° °FAMILY HISTORY:  ° °Family History  °Problem Relation  Age of Onset  ° Coronary artery disease Mother   ° Heart disease Mother   °     After age 60  ° Heart attack Mother   ° Coronary artery disease Sister   ° Heart disease Sister   °     Before  age 60 -  Heart Transplant  ° Cancer Sister   °     Kidney  ° Heart attack Sister   ° Heart disease Father   °     After age 60  ° Other Father   °     amputation  ° ° °SOCIAL HISTORY:  ° °Social History  ° °Tobacco Use  ° Smoking status: Every Day  °  Packs/day: 0.50  °  Years: 50.00  °  Pack years: 25.00  °  Types: Cigarettes  ° Smokeless tobacco: Never  °Substance Use Topics  ° Alcohol use: No  ° ° ° °ALLERGIES:  ° °No Known Allergies ° ° °CURRENT MEDICATIONS:  ° °Current Outpatient Medications  °Medication Sig Dispense Refill  ° amLODipine (NORVASC) 5 MG tablet Take by mouth.    ° aspirin 81 MG tablet Take 81 mg by mouth daily.    ° Cholecalciferol (VITAMIN D3) 50 MCG (2000 UT) TABS Take 2,000 Units by mouth 4 (four) times daily.    ° clopidogrel (PLAVIX) 75 MG tablet Take 75 mg by mouth daily.    ° famotidine (PEPCID) 20 MG tablet Take 20 mg by mouth daily as needed   for heartburn.     ° losartan (COZAAR) 50 MG tablet Take 50 mg by mouth Daily.    ° metFORMIN (GLUCOPHAGE-XR) 500 MG 24 hr tablet Take 500 mg by mouth daily.     ° Omega-3 Fatty Acids (FISH OIL) 1000 MG CAPS Take by mouth.    ° Propylene Glycol (SYSTANE COMPLETE OP) Place 1 drop into the left eye daily as needed (Dry eye).    ° simvastatin (ZOCOR) 80 MG tablet Take 80 mg by mouth at bedtime.    ° °No current facility-administered medications for this visit.  ° ° °REVIEW OF SYSTEMS:  ° °[X] denotes positive finding, [ ] denotes negative finding °Cardiac  Comments:  °Chest pain or chest pressure:    °Shortness of breath upon exertion:    °Short of breath when lying flat:    °Irregular heart rhythm:    °    °Vascular    °Pain in calf, thigh, or hip brought on by ambulation:    °Pain in feet at night that wakes you up from your sleep:     °Blood clot in your veins:     °Leg swelling:     °    °Pulmonary    °Oxygen at home:    °Productive cough:     °Wheezing:     °    °Neurologic    °Sudden weakness in arms or legs:     °Sudden numbness in arms or legs:     °Sudden onset of difficulty speaking or slurred speech:    °Temporary loss of vision in one eye:     °Problems with dizziness:     °    °Gastrointestinal    °Blood in stool:     °Vomited blood:     °    °Genitourinary    °Burning when urinating:     °Blood in urine:    °    °Psychiatric    °Major depression:     °    °Hematologic    °Bleeding problems:    °Problems with blood clotting too easily:    °    °Skin    °Rashes or ulcers:    °    °Constitutional    °Fever or chills:    ° ° °PHYSICAL EXAM:  ° °Vitals:  ° 02/09/21 1010 02/09/21 1012  °BP: 122/74 139/68  °Pulse: 66 67  °Resp: 16   °Temp: (!) 97.4 °F (36.3 °C)   °TempSrc: Temporal   °SpO2: 95%   °Weight: 62.6 kg   °Height: 5' 3" (1.6 m)   ° ° °GENERAL: The patient is a well-nourished female, in no acute distress. The vital signs are documented above. °CARDIAC: There is a regular rate and rhythm.  °PULMONARY: Non-labored respirations °MUSCULOSKELETAL: There are no major deformities or cyanosis. °NEUROLOGIC: No focal weakness or paresthesias are detected. °SKIN: There are no ulcers or rashes noted. °PSYCHIATRIC: The patient has a normal affect. ° °STUDIES:  ° °I have reviewed her CT scan with the following findings: °1. 65% stenosis in the proximal left ICA, secondary to calcified and °noncalcified plaque. Plaque in the left common carotid artery also °causes 50-60% luminal narrowing. °2. 60% stenosis in the right V1 segment, which is otherwise patent. °No other hemodynamically significant stenosis in the neck. °3. Multiple hypoenhancing nodules in the thyroid, the largest of °which measures up to 1.2 cm. Given the lesions' size, no follow-up °is recommended. (Reference: J Am Coll Radiol. 2015 Feb;12(2): °143-50) Aortic   Atherosclerosis (ICD10-I70.0) and  Emphysema °(ICD10-J43.9). °4 °DUPLEX °Summary:  °Right Carotid: Velocities in the right ICA are consistent with a 40-59%  °               stenosis.  ° °Left Carotid: Velocities in the left ICA are consistent with a 80-99%  °stenosis.  ° °Vertebrals:  Bilateral vertebral arteries demonstrate antegrade flow.  °Subclavians: Right subclavian artery flow was disturbed. Normal flow  °             hemodynamics were seen in the left subclavian artery.  °MEDICAL ISSUES:  ° °Asymptomatic left carotid stenosis: By ultrasound this was greater than 80%.  CT scan suggest 65% left carotid stenosis, however I think this underestimates the degree of stenosis.  I further discussed this with radiology and they are in agreement that this is under estimated stenosis.  In addition, there appears to be soft plaque at the bifurcation.  Therefore, I have recommended proceeding with intervention for stroke prevention.  I think she would be a candidate for TCAR on the left, however she does have a large bend.  We discussed the risk of stroke and bleeding.  She will continue her aspirin statin and Plavix.  If for some reason she is rejected for being a TCAR candidate, we would proceed with carotid endarterectomy. ° ° ° °Wells Kimarion Chery, IV, MD, FACS °[facility] [place] °Tel [phone] °Pager [phone]  ° °Case reviewed for TCAR.  I will plan on trying to land the stent just beyond the stenosis so as to not disrupt the curve within the artery.  I will likely need to deploy the stent with the wire not around the corner.  We also talked about preclamping. °

## 2021-02-09 NOTE — H&P (View-Only) (Signed)
[facility] [place]  Patient name: [name] MRN: [id number] DOB: [birth year] Sex: female   REASON FOR VISIT:    Follow up  HISOTRY OF PRESENT ILLNESS:    [name] is a 81 y.o. female,  who is s/p left femoropopliteal artery bypass graft using saphenous vein on 10/12/2012, left superficial femoral artery stent 03/11/2005, left popliteal artery stent 04/30/2009, and right superficial femoral artery stent on 10/17/2009.  At her last visit, she had a drop in her ABIs and was scheduled for angiography.  This occurred on 05/29/2019.  She was found to have in-stent stenosis within the right superficial femoral artery which was greater than 90%.  I treated this with laser atherectomy and drug-coated balloon angioplasty.  There was a native popliteal artery stenosis that was treated with laser atherectomy and drug-coated balloon angioplasty.  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  She is without claudication or rest pain, or nonhealing wounds.  Her renal function has been stable.  Her most recent carotid duplex suggested greater than 80% left carotid stenosis.  She was sent for CT scan and is back today for further discussions.      The patient continues to take dual antiplatelet therapy.  She is on Zocor for hypercholesterolemia.  She takes an ARB for hypertension.  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   Stroke [facility]) 2008     FAMILY HISTORY:   Family History  Problem Relation  Age of Onset   Coronary artery disease Mother    Heart disease Mother        After age 80   Heart attack Mother    Coronary artery disease Sister    Heart disease Sister        Before  age 70 -  Heart Transplant   Cancer Sister        Kidney   Heart attack Sister    Heart disease Father        After age 80   Other Father        amputation    SOCIAL HISTORY:   Social History   Tobacco Use   Smoking status: Every Day    Packs/day: 0.50    Years: 50.00    Pack years: 25.00    Types: Cigarettes   Smokeless tobacco: Never  Substance Use Topics   Alcohol use: No     ALLERGIES:   No Known Allergies   CURRENT MEDICATIONS:   Current Outpatient Medications  Medication Sig Dispense Refill   amLODipine (NORVASC) 5 MG tablet Take by mouth.     aspirin 81 MG tablet Take 81 mg by mouth daily.     Cholecalciferol (VITAMIN D3) 50 MCG (2000 UT) TABS Take 2,000 Units by mouth 4 (four) times daily.     clopidogrel (PLAVIX) 75 MG tablet Take 75 mg by mouth daily.     famotidine (PEPCID) 20 MG tablet Take 20 mg by mouth daily as needed  for heartburn.      losartan (COZAAR) 50 MG tablet Take 50 mg by mouth Daily.     metFORMIN (GLUCOPHAGE-XR) 500 MG 24 hr tablet Take 500 mg by mouth daily.      Omega-3 Fatty Acids (FISH OIL) 1000 MG CAPS Take by mouth.     Propylene Glycol (SYSTANE COMPLETE OP) Place 1 drop into the left eye daily as needed (Dry eye).     simvastatin (ZOCOR) 80 MG tablet Take 80 mg by mouth at bedtime.     No current facility-administered medications for this visit.    REVIEW OF SYSTEMS:   [X]  denotes positive finding, [ ]  denotes negative finding Cardiac  Comments:  Chest pain or chest pressure:    Shortness of breath upon exertion:    Short of breath when lying flat:    Irregular heart rhythm:        Vascular    Pain in calf, thigh, or hip brought on by ambulation:    Pain in feet at night that wakes you up from your sleep:     Blood clot in your veins:     Leg swelling:         Pulmonary    Oxygen at home:    Productive cough:     Wheezing:         Neurologic    Sudden weakness in arms or legs:     Sudden numbness in arms or legs:     Sudden onset of difficulty speaking or slurred speech:    Temporary loss of vision in one eye:     Problems with dizziness:         Gastrointestinal    Blood in stool:     Vomited blood:         Genitourinary    Burning when urinating:     Blood in urine:        Psychiatric    Major depression:         Hematologic    Bleeding problems:    Problems with blood clotting too easily:        Skin    Rashes or ulcers:        Constitutional    Fever or chills:      PHYSICAL EXAM:   Vitals:   02/09/21 1010 02/09/21 1012  BP: 122/74 139/68  Pulse: 66 67  Resp: 16   Temp: (!) 97.4 F (36.3 C)   TempSrc: Temporal   SpO2: 95%   Weight: 62.6 kg   Height: 5\' 3"  (1.6 m)     GENERAL: The patient is a well-nourished female, in no acute distress. The vital signs are documented above. CARDIAC: There is a regular rate and rhythm.  PULMONARY: Non-labored respirations MUSCULOSKELETAL: There are no major deformities or cyanosis. NEUROLOGIC: No focal weakness or paresthesias are detected. SKIN: There are no ulcers or rashes noted. PSYCHIATRIC: The patient has a normal affect.  STUDIES:   I have reviewed her CT scan with the following findings: 1. 65% stenosis in the proximal left ICA, secondary to calcified and noncalcified plaque. Plaque in the left common carotid artery also causes 50-60% luminal narrowing. 2. 60% stenosis in the right V1 segment, which is otherwise patent. No other hemodynamically significant stenosis in the neck. 3. Multiple hypoenhancing nodules in the thyroid, the largest of which measures up to 1.2 cm. Given the lesions' size, no follow-up is recommended. (Reference: J Am Coll Radiol. 2015 Feb;12(2): 143-50) Aortic  Atherosclerosis (ICD10-I70.0) and  Emphysema (ICD10-J43.9). 4 DUPLEX Summary:  Right Carotid: Velocities in the right ICA are consistent with a 40-59%                 stenosis.   Left Carotid: Velocities in the left ICA are consistent with a 80-99%  stenosis.   Vertebrals:  Bilateral vertebral arteries demonstrate antegrade flow.  Subclavians: Right subclavian artery flow was disturbed. Normal flow               hemodynamics were seen in the left subclavian artery.  MEDICAL ISSUES:   Asymptomatic left carotid stenosis: By ultrasound this was greater than 80%.  CT scan suggest 65% left carotid stenosis, however I think this underestimates the degree of stenosis.  I further discussed this with radiology and they are in agreement that this is under estimated stenosis.  In addition, there appears to be soft plaque at the bifurcation.  Therefore, I have recommended proceeding with intervention for stroke prevention.  I think she would be a candidate for TCAR on the left, however she does have a large bend.  We discussed the risk of stroke and bleeding.  She will continue her aspirin statin and Plavix.  If for some reason she is rejected for being a TCAR candidate, we would proceed with carotid endarterectomy.    Leia Alf, MD, FACS [facility] [phone] Pager [phone]   Case reviewed for TCAR.  I will plan on trying to land the stent just beyond the stenosis so as to not disrupt the curve within the artery.  I will likely need to deploy the stent with the wire not around the corner.  We also talked about preclamping.

## 2021-02-11 ENCOUNTER — Other Ambulatory Visit: Payer: Self-pay

## 2021-02-11 DIAGNOSIS — I6523 Occlusion and stenosis of bilateral carotid arteries: Secondary | ICD-10-CM

## 2021-02-12 ENCOUNTER — Other Ambulatory Visit: Payer: Self-pay | Admitting: *Deleted

## 2021-02-12 DIAGNOSIS — I701 Atherosclerosis of renal artery: Secondary | ICD-10-CM

## 2021-02-12 DIAGNOSIS — I70213 Atherosclerosis of native arteries of extremities with intermittent claudication, bilateral legs: Secondary | ICD-10-CM

## 2021-02-12 DIAGNOSIS — Z95828 Presence of other vascular implants and grafts: Secondary | ICD-10-CM

## 2021-02-23 NOTE — Congregational Nurse Program (Signed)
Surgical Instructions    Your procedure is scheduled on Friday, February 24th, 2023.   Report to [facility] Main Entrance "A" at 08:30 A.M., then check in with the Admitting office.  Call this number if you have problems the morning of surgery:  [phone]   If you have any questions prior to your surgery date call [phone]: Open Monday-Friday 8am-4pm    Remember:  Do not eat or drink after midnight the night before your surgery     Take these medicines the morning of surgery with A SIP OF WATER:   amLODipine (NORVASC)  Propylene Glycol (SYSTANE COMPLETE OP) - as needed  Follow your surgeon's instructions on when to stop Aspirin and Plavix.  If no instructions were given by your surgeon then you will need to call the office to get those instructions.     As of today, STOP taking any Aspirin (unless otherwise instructed by your surgeon) Aleve, Naproxen, Ibuprofen, Motrin, Advil, Goody's, BC's, all herbal medications, fish oil, and all vitamins.   WHAT DO I DO ABOUT MY DIABETES MEDICATION?   Do not take metFORMIN (GLUCOPHAGE-XR) the morning of surgery.   HOW TO MANAGE YOUR DIABETES BEFORE AND AFTER SURGERY  Why is it important to control my blood sugar before and after surgery? Improving blood sugar levels before and after surgery helps healing and can limit problems. A way of improving blood sugar control is eating a healthy diet by:  Eating less sugar and carbohydrates  Increasing activity/exercise  Talking with your doctor about reaching your blood sugar goals High blood sugars (greater than 180 mg/dL) can raise your risk of infections and slow your recovery, so you will need to focus on controlling your diabetes during the weeks before surgery. Make sure that the doctor who takes care of your diabetes knows about your planned surgery including the date and location.  How do I manage my blood sugar before surgery? Check your blood sugar at least 4 times a day,  starting 2 days before surgery, to make sure that the level is not too high or low.  Check your blood sugar the morning of your surgery when you wake up and every 2 hours until you get to the Short Stay unit.  If your blood sugar is less than 70 mg/dL, you will need to treat for low blood sugar: Do not take insulin. Treat a low blood sugar (less than 70 mg/dL) with  cup of clear juice (cranberry or apple), 4 glucose tablets, OR glucose gel. Recheck blood sugar in 15 minutes after treatment (to make sure it is greater than 70 mg/dL). If your blood sugar is not greater than 70 mg/dL on recheck, call [phone] for further instructions. Report your blood sugar to the short stay nurse when you get to Short Stay.  If you are admitted to the hospital after surgery: Your blood sugar will be checked by the staff and you will probably be given insulin after surgery (instead of oral diabetes medicines) to make sure you have good blood sugar levels. The goal for blood sugar control after surgery is 80-180 mg/dL.    The day of surgery:          Do not wear jewelry or makeup Do not wear lotions, powders, perfumes, or deodorant. Do not shave 48 hours prior to surgery.   Do not bring valuables to the hospital. Do not wear nail polish, gel polish, artificial nails, or any other type of covering on natural nails (fingers  and toes) If you have artificial nails or gel coating that need to be removed by a nail salon, please have this removed prior to surgery. Artificial nails or gel coating may interfere with anesthesia's ability to adequately monitor your vital signs.   [facility] is not responsible for any belongings or valuables. .   Do NOT Smoke (Tobacco/Vaping)  24 hours prior to your procedure  If you use a CPAP at night, you may bring your mask for your overnight stay.   Contacts, glasses, hearing aids, dentures or partials may not be worn into surgery, please bring cases for these belongings    For patients admitted to the hospital, discharge time will be determined by your treatment team.   Patients discharged the day of surgery will not be allowed to drive home, and someone needs to stay with them for 24 hours.  NO VISITORS WILL BE ALLOWED IN PRE-OP WHERE PATIENTS ARE PREPPED FOR SURGERY.  ONLY 1 SUPPORT PERSON MAY BE PRESENT IN THE WAITING ROOM WHILE YOU ARE IN SURGERY.  IF YOU ARE TO BE ADMITTED, ONCE YOU ARE IN YOUR ROOM YOU WILL BE ALLOWED TWO (2) VISITORS. 1 (ONE) VISITOR MAY STAY OVERNIGHT BUT MUST ARRIVE TO THE ROOM BY 8pm.  Minor children may have two parents present. Special consideration for safety and communication needs will be reviewed on a case by case basis.  Special instructions:    Oral Hygiene is also important to reduce your risk of infection.  Remember - BRUSH YOUR TEETH THE MORNING OF SURGERY WITH YOUR REGULAR TOOTHPASTE   [facility]- Preparing For Surgery  Before surgery, you can play an important role. Because skin is not sterile, your skin needs to be as free of germs as possible. You can reduce the number of germs on your skin by washing with CHG (chlorahexidine gluconate) Soap before surgery.  CHG is an antiseptic cleaner which kills germs and bonds with the skin to continue killing germs even after washing.     Please do not use if you have an allergy to CHG or antibacterial soaps. If your skin becomes reddened/irritated stop using the CHG.  Do not shave (including legs and underarms) for at least 48 hours prior to first CHG shower. It is OK to shave your face.  Please follow these instructions carefully.     Shower the NIGHT BEFORE SURGERY and the MORNING OF SURGERY with CHG Soap.   If you chose to wash your hair, wash your hair first as usual with your normal shampoo. After you shampoo, rinse your hair and body thoroughly to remove the shampoo.  Then [employer] and genitals (private parts) with your normal soap and rinse thoroughly to remove  soap.  After that Use CHG Soap as you would any other liquid soap. You can apply CHG directly to the skin and wash gently with a scrungie or a clean washcloth.   Apply the CHG Soap to your body ONLY FROM THE NECK DOWN.  Do not use on open wounds or open sores. Avoid contact with your eyes, ears, mouth and genitals (private parts). Wash Face and genitals (private parts)  with your normal soap.   Wash thoroughly, paying special attention to the area where your surgery will be performed.  Thoroughly rinse your body with warm water from the neck down.  DO NOT shower/wash with your normal soap after using and rinsing off the CHG Soap.  Pat yourself dry with a CLEAN TOWEL.  Wear CLEAN PAJAMAS  to bed the night before surgery  Place CLEAN SHEETS on your bed the night before your surgery  DO NOT SLEEP WITH PETS.   Day of Surgery:  Take a shower with CHG soap. Wear Clean/Comfortable clothing the morning of surgery Do not apply any deodorants/lotions.   Remember to brush your teeth WITH YOUR REGULAR TOOTHPASTE.    COVID testing  If you are going to stay overnight or be admitted after your procedure/surgery and require a pre-op COVID test, please follow these instructions after your COVID test   You are not required to quarantine however you are required to wear a well-fitting mask when you are out and around people not in your household.  If your mask becomes wet or soiled, replace with a new one.  Wash your hands often with soap and water for 20 seconds or clean your hands with an alcohol-based hand sanitizer that contains at least 60% alcohol.  Do not share personal items.  Notify your provider: if you are in close contact with someone who has COVID  or if you develop a fever of 100.4 or greater, sneezing, cough, sore throat, shortness of breath or body aches.    Please read over the following fact sheets that you were given.

## 2021-02-24 ENCOUNTER — Encounter (HOSPITAL_COMMUNITY)
Admission: RE | Admit: 2021-02-24 | Discharge: 2021-02-24 | Disposition: A | Discharge status: Home / Self Care | Payer: Medicare Other | Source: Ambulatory Visit | Attending: Surgery | Admitting: Surgery

## 2021-02-24 ENCOUNTER — Other Ambulatory Visit: Payer: Self-pay

## 2021-02-24 ENCOUNTER — Encounter (HOSPITAL_COMMUNITY): Payer: Self-pay

## 2021-02-24 VITALS — BP 139/65 | HR 66 | Temp 97.7°F | Resp 16 | Ht 62.0 in | Wt 138.7 lb

## 2021-02-24 DIAGNOSIS — F17210 Nicotine dependence, cigarettes, uncomplicated: Secondary | ICD-10-CM | POA: Insufficient documentation

## 2021-02-24 DIAGNOSIS — Z7982 Long term (current) use of aspirin: Secondary | ICD-10-CM | POA: Insufficient documentation

## 2021-02-24 DIAGNOSIS — Z01818 Encounter for other preprocedural examination: Secondary | ICD-10-CM

## 2021-02-24 DIAGNOSIS — I2510 Atherosclerotic heart disease of native coronary artery without angina pectoris: Secondary | ICD-10-CM | POA: Insufficient documentation

## 2021-02-24 DIAGNOSIS — Z20822 Contact with and (suspected) exposure to covid-19: Secondary | ICD-10-CM | POA: Insufficient documentation

## 2021-02-24 DIAGNOSIS — I10 Essential (primary) hypertension: Secondary | ICD-10-CM | POA: Insufficient documentation

## 2021-02-24 DIAGNOSIS — Z95820 Peripheral vascular angioplasty status with implants and grafts: Secondary | ICD-10-CM | POA: Insufficient documentation

## 2021-02-24 DIAGNOSIS — Z7902 Long term (current) use of antithrombotics/antiplatelets: Secondary | ICD-10-CM | POA: Insufficient documentation

## 2021-02-24 DIAGNOSIS — I6523 Occlusion and stenosis of bilateral carotid arteries: Secondary | ICD-10-CM

## 2021-02-24 DIAGNOSIS — E1151 Type 2 diabetes mellitus with diabetic peripheral angiopathy without gangrene: Secondary | ICD-10-CM | POA: Insufficient documentation

## 2021-02-24 DIAGNOSIS — Z8673 Personal history of transient ischemic attack (TIA), and cerebral infarction without residual deficits: Secondary | ICD-10-CM | POA: Insufficient documentation

## 2021-02-24 DIAGNOSIS — E785 Hyperlipidemia, unspecified: Secondary | ICD-10-CM | POA: Insufficient documentation

## 2021-02-24 DIAGNOSIS — I701 Atherosclerosis of renal artery: Secondary | ICD-10-CM | POA: Insufficient documentation

## 2021-02-24 HISTORY — DX: Atherosclerosis of renal artery: I70.1

## 2021-02-24 LAB — CBC
HCT: 42.3 % (ref 36.0–46.0)
Hemoglobin: 13 g/dL (ref 12.0–15.0)
MCH: 28.6 pg (ref 26.0–34.0)
MCHC: 30.7 g/dL (ref 30.0–36.0)
MCV: 93.2 fL (ref 80.0–100.0)
Platelets: 255 10*3/uL (ref 150–400)
RBC: 4.54 MIL/uL (ref 3.87–5.11)
RDW: 16.6 % — ABNORMAL HIGH (ref 11.5–15.5)
WBC: 7.9 10*3/uL (ref 4.0–10.5)
nRBC: 0 % (ref 0.0–0.2)

## 2021-02-24 LAB — COMPREHENSIVE METABOLIC PANEL
ALT: 15 U/L (ref 0–44)
AST: 18 U/L (ref 15–41)
Albumin: 3.6 g/dL (ref 3.5–5.0)
Alkaline Phosphatase: 74 U/L (ref 38–126)
Anion gap: 8 (ref 5–15)
BUN: 16 mg/dL (ref 8–23)
CO2: 24 mmol/L (ref 22–32)
Calcium: 9.1 mg/dL (ref 8.9–10.3)
Chloride: 110 mmol/L (ref 98–111)
Creatinine, Ser: 1.25 mg/dL — ABNORMAL HIGH (ref 0.44–1.00)
GFR, Estimated: 44 mL/min — ABNORMAL LOW (ref >60)
Glucose, Bld: 94 mg/dL (ref 70–99)
Potassium: 4.3 mmol/L (ref 3.5–5.1)
Sodium: 142 mmol/L (ref 135–145)
Total Bilirubin: 0.5 mg/dL (ref 0.3–1.2)
Total Protein: 7.2 g/dL (ref 6.5–8.1)

## 2021-02-24 LAB — PROTIME-INR
INR: 1.1 (ref 0.8–1.2)
Prothrombin Time: 13.7 seconds (ref 11.4–15.2)

## 2021-02-24 LAB — URINALYSIS, ROUTINE W REFLEX MICROSCOPIC
Bilirubin Urine: NEGATIVE
Glucose, UA: NEGATIVE mg/dL
Hgb urine dipstick: NEGATIVE
Ketones, ur: NEGATIVE mg/dL
Leukocytes,Ua: NEGATIVE
Nitrite: NEGATIVE
Protein, ur: NEGATIVE mg/dL
Specific Gravity, Urine: 1.02 (ref 1.005–1.030)
pH: 5 (ref 5.0–8.0)

## 2021-02-24 LAB — SURGICAL PCR SCREEN
MRSA, PCR: NEGATIVE
Staphylococcus aureus: NEGATIVE

## 2021-02-24 LAB — TYPE AND SCREEN
ABO/RH(D): O POS
Antibody Screen: NEGATIVE

## 2021-02-24 LAB — APTT: aPTT: 31 seconds (ref 24–36)

## 2021-02-24 LAB — SARS CORONAVIRUS 2 (TAT 6-24 HRS): SARS Coronavirus 2: NEGATIVE

## 2021-02-24 LAB — GLUCOSE, CAPILLARY: Glucose-Capillary: 90 mg/dL (ref 70–99)

## 2021-02-24 NOTE — Progress Notes (Signed)
PCP - Marinell Blight, MD Cardiologist - denies  PPM/ICD - denies Device Orders - n/a Rep Notified - n/a  Chest x-ray - n/a EKG - 02/24/2021 Stress Test - denies ECHO - denies Cardiac Cath - denies  Sleep Study - denies CPAP - n/a  Fasting Blood Sugar - 90 - 120 Checks Blood Sugar 3 times a week CBG today - 90 A1C - records requested  [facility], [place]) - patient verbalized she had one done in February 2023  Blood Thinner Instructions: Plavix - not holding prior surgery per patient   Aspirin Instructions: not holding prior surgery per patient.   Patient was instructed: As of today, STOP taking any Aspirin (unless otherwise instructed by your surgeon) Aleve, Naproxen, Ibuprofen, Motrin, Advil, Goody's, BC's, all herbal medications, fish oil, and all vitamins.    ERAS Protcol - n/a  COVID TEST- done in PAT on 02/24/2021   Anesthesia review: yes - cardiac history  Patient denies shortness of breath, fever, cough and chest pain at PAT appointment   All instructions explained to the patient, with a verbal understanding of the material. Patient agrees to go over the instructions while at home for a better understanding. Patient also instructed to self quarantine after being tested for COVID-19. The opportunity to ask questions was provided.

## 2021-02-25 ENCOUNTER — Encounter (HOSPITAL_COMMUNITY): Payer: Self-pay

## 2021-02-25 NOTE — Anesthesia Preprocedure Evaluation (Addendum)
Anesthesia Evaluation  Patient identified by MRN, date of birth, ID band Patient awake    Reviewed: Allergy & Precautions, H&P , NPO status , Patient's Chart, lab work & pertinent test results  Airway Mallampati: II  TM Distance: >3 FB Neck ROM: Full    Dental no notable dental hx. (+) Partial Upper, Dental Advisory Given   Pulmonary Current Smoker and Patient abstained from smoking.,    Pulmonary exam normal breath sounds clear to auscultation       Cardiovascular hypertension, Pt. on medications + Peripheral Vascular Disease   Rhythm:Regular Rate:Normal     Neuro/Psych CVA, No Residual Symptoms negative psych ROS   GI/Hepatic negative GI ROS, Neg liver ROS,   Endo/Other  diabetes, Type 2, Oral Hypoglycemic Agents  Renal/GU Renal disease  negative genitourinary   Musculoskeletal   Abdominal   Peds  Hematology negative hematology ROS (+)   Anesthesia Other Findings   Reproductive/Obstetrics negative OB ROS                           Anesthesia Physical Anesthesia Plan  ASA: 3  Anesthesia Plan: General   Post-op Pain Management: Tylenol PO (pre-op)*   Induction: Intravenous  PONV Risk Score and Plan: 3 and Ondansetron, Dexamethasone and Treatment may vary due to age or medical condition  Airway Management Planned: Oral ETT  Additional Equipment: Arterial line  Intra-op Plan:   Post-operative Plan: Extubation in OR  Informed Consent: I have reviewed the patients History and Physical, chart, labs and discussed the procedure including the risks, benefits and alternatives for the proposed anesthesia with the patient or authorized representative who has indicated his/her understanding and acceptance.     Dental advisory given  Plan Discussed with: CRNA  Anesthesia Plan Comments: (PAT note written 02/25/2021 by Shonna Chock, PA-C. )       Anesthesia Quick Evaluation

## 2021-02-25 NOTE — Progress Notes (Signed)
Anesthesia Chart Review:  Case: [id number] Date/Time: 02/27/21 1018   Procedure: Left Transcarotid Artery Revascularization (Left)   Anesthesia type: General   Pre-op diagnosis: Bilateral carotid stenosis   [facility] / [facility]   Surgeons: Nada Libman, MD       DISCUSSION: Patient is a 81 year old female scheduled for the above procedure.  History includes smoking, HTN, HLD, CVA (2008), DM2 (diet controlled), carotid artery disease, PAD (left SFA stent  03/11/05; right SFA & popliteal stents 10/17/08; left popliteal stent 04/29/09; Left common femoral to below knee popliteal bypass using LGSV graft 10/12/12; right SFA laser atherectomy, angioplasty 05/29/19), renal artery stenosis (left renal artery stent 06/19/19).   02/24/2021 presurgical COVID-19 test negative.  Anesthesia team to evaluate on the day of surgery. She is to continue ASA and Plavix per VVS.    VS: BP 139/65    Pulse 66    Temp 36.5 C (Oral)    Resp 16    Ht 5\' 2"  (1.575 m)    Wt 62.9 kg    SpO2 100%    BMI 25.37 kg/m    PROVIDERS: , MD is PCP [facility], some records available in Care Everywhere)   LABS: Labs reviewed: Acceptable for surgery. A1c 5.7% on 02/02/21 ([facility]). (all labs ordered are listed, but only abnormal results are displayed)  Labs Reviewed  CBC - Abnormal; Notable for the following components:      Result Value   RDW 16.6 (*)    All other components within normal limits  COMPREHENSIVE METABOLIC PANEL - Abnormal; Notable for the following components:   Creatinine, Ser 1.25 (*)    GFR, Estimated 44 (*)    All other components within normal limits  SURGICAL PCR SCREEN  SARS CORONAVIRUS 2 (TAT 6-24 HRS)  GLUCOSE, CAPILLARY  PROTIME-INR  APTT  URINALYSIS, ROUTINE W REFLEX MICROSCOPIC  TYPE AND SCREEN     IMAGES: CTA Neck 02/05/21: IMPRESSION: 1. 65% stenosis in the proximal left ICA, secondary to calcified and noncalcified plaque. Plaque in the left common carotid  artery also causes 50-60% luminal narrowing. 2. 60% stenosis in the right V1 segment, which is otherwise patent. No other hemodynamically significant stenosis in the neck. 3. Multiple hypoenhancing nodules in the thyroid, the largest of which measures up to 1.2 cm. Given the lesions' size, no follow-up is recommended. (Reference: J Am Coll Radiol. 2015 Feb;12(2): 143-50) Aortic Atherosclerosis (ICD10-I70.0) and Emphysema (ICD10-J43.9).  12-23-1995 Renal 01/01/21: Summary:  Renal:    Left: Normal size of left kidney. LRV flow present. Left renal        artery stent was not clearly visualized, however >60% stenosis        is noted in the origin and proximal renal artery segments.    EKG: 02/24/21:  Sinus bradycardia at 57 bpm with marked sinus arrhythmia Left axis deviation Left ventricular hypertrophy with QRS widening ( R in aVL , Cornell product ) Cannot rule out Septal infarct , age undetermined Abnormal ECG When compared with ECG of 29-May-2019 07:20, No significant change was found Confirmed by 31-May-2019 [phone]) on 02/24/2021 9:56:50 PM   CV: She denied prior echocardiogram, stress test, cardiac cath.  02/26/2021 Carotid 01/01/21: Summary:  - Right Carotid: Velocities in the right ICA are consistent with a 40-59%                 stenosis.  - Left Carotid: Velocities in the left ICA are consistent with a  80-99%  stenosis.  - Vertebrals:  Bilateral vertebral arteries demonstrate antegrade flow.  - Subclavians: Right subclavian artery flow was disturbed. Normal flow               hemodynamics were seen in the left subclavian artery.    Past Medical History:  Diagnosis Date   Carotid artery occlusion    Diabetes mellitus    borderline , no longer treated /w medicine    Hyperlipidemia    Hypertension    Peripheral vascular disease (HCC)    emboli of left foot from left superficial femoral and popliteal occulsive disease   Renal artery stenosis (HCC)    s/p left renal artery stent  06/19/19   Stroke (HCC) 01/04/2006    Past Surgical History:  Procedure Laterality Date   ABDOMINAL AORTAGRAM N/A 10/03/2012   Procedure: ABDOMINAL Ronny Flurry;  Surgeon: Nada Libman, MD;  Location: [facility] CATH LAB;  Service: Cardiovascular;  Laterality: N/A;   ABDOMINAL AORTOGRAM N/A 06/19/2019   Procedure: ABDOMINAL AORTOGRAM;  Surgeon: Nada Libman, MD;  [facility] INVASIVE CV LAB;  Service: Cardiovascular;  Laterality: N/A;   ABDOMINAL AORTOGRAM W/LOWER EXTREMITY Bilateral 05/29/2019   Procedure: ABDOMINAL AORTOGRAM W/LOWER EXTREMITY;  Surgeon: Nada Libman, MD;  [facility] INVAS[facility] CV LAB;  Service: Cardiovascular;  Laterality: Bilateral;   FEMORAL-POPLITEAL BYPASS GRAFT Left 10/12/2012   Procedure: BYPASS GRAFT FEMORAL-POPLITEAL ARTERY;  Surgeon: Pryor Ochoa, MD;  Location: [facility] OR;  Service: Vascular;  Laterality: Left;   INGUINAL HERNIA REPAIR     right   left popliteal stent   04/30/2009   LOWER EXTREMITY ANGIOGRAM Bilateral 10/03/2012   Procedure: LOWER EXTREMITY ANGIOGRAM;  Surgeon: Nada Libman, MD;  Location: [facility] CATH LAB;  Service: Cardiovascular;  Laterality: Bilateral;   PERIPHERAL VASCULAR ATHERECTOMY Right 05/29/2019   Procedure: PERIPHERAL VASCULAR ATHERECTOMY;  Surgeon: Nada Libman, MD;  [facility] CV [facility];  Service: Cardiovascular;  Laterality: Right;  laser  SFA   PERIPHERAL VASCULAR BALLOON ANGIOPLASTY Right 05/29/2019   Procedure: PERIPHERAL VASCULAR BALLOON ANGIOPLASTY;  Surgeon: Nada Libman, MD;  [facility] CV LAB;  Service: Cardiovascular;  Laterality: Right;  SFA   PERIPHERAL VASCULAR INTERVENTION Left 06/19/2019   Procedure: PERIPHERAL VASCULAR INTERVENTION;  Surgeon: Nada Libman, MD;  [facility] CV [facility];  Service: Cardiovascular;  Laterality: Left;  RENAL   right SFA stent   01/05/2008   SFA STENT  04/04/2005   LEFT   TUBAL LIGATION     VASCULAR SURGERY      MEDICATIONS:  amLODipine (NORVASC) 5 MG  tablet   aspirin 81 MG EC tablet   Cholecalciferol (VITAMIN D3) 50 MCG (2000 UT) TABS   clopidogrel (PLAVIX) 75 MG tablet   losartan (COZAAR) 50 MG tablet   metFORMIN (GLUCOPHAGE-XR) 500 MG 24 hr tablet   Propylene Glycol (SYSTANE COMPLETE OP)   simvastatin (ZOCOR) 80 MG tablet   vitamin B-12 (CYANOCOBALAMIN) 1000 MCG tablet   No current facility-administered medications for this encounter.    Shonna Chock, PA-C Surgical Short Stay/Anesthesiology [facility] Phone [phone] [facility] Phone [phone] 02/25/2021 4:15 PM

## 2021-02-27 ENCOUNTER — Encounter (HOSPITAL_COMMUNITY): Payer: Self-pay | Admitting: Surgery

## 2021-02-27 ENCOUNTER — Other Ambulatory Visit: Payer: Self-pay

## 2021-02-27 ENCOUNTER — Inpatient Hospital Stay (HOSPITAL_COMMUNITY)
Admission: RE | Admit: 2021-02-27 | Discharge: 2021-02-28 | DRG: 036 | Disposition: A | Discharge status: Home / Self Care | Payer: Medicare Other | Attending: Surgery | Admitting: Surgery

## 2021-02-27 ENCOUNTER — Inpatient Hospital Stay (HOSPITAL_COMMUNITY): Payer: Medicare Other

## 2021-02-27 ENCOUNTER — Inpatient Hospital Stay (HOSPITAL_COMMUNITY): Payer: Medicare Other | Admitting: Vascular Surgery

## 2021-02-27 ENCOUNTER — Encounter (HOSPITAL_COMMUNITY)
Admission: RE | Disposition: A | Discharge status: Home / Self Care | Payer: Self-pay | Source: Home / Self Care | Attending: Surgery

## 2021-02-27 ENCOUNTER — Inpatient Hospital Stay (HOSPITAL_COMMUNITY): Payer: Medicare Other | Admitting: Anesthesiology

## 2021-02-27 DIAGNOSIS — Z8249 Family history of ischemic heart disease and other diseases of the circulatory system: Secondary | ICD-10-CM | POA: Diagnosis not present

## 2021-02-27 DIAGNOSIS — E1151 Type 2 diabetes mellitus with diabetic peripheral angiopathy without gangrene: Secondary | ICD-10-CM | POA: Diagnosis present

## 2021-02-27 DIAGNOSIS — F17210 Nicotine dependence, cigarettes, uncomplicated: Secondary | ICD-10-CM | POA: Diagnosis present

## 2021-02-27 DIAGNOSIS — I10 Essential (primary) hypertension: Secondary | ICD-10-CM

## 2021-02-27 DIAGNOSIS — Z20822 Contact with and (suspected) exposure to covid-19: Secondary | ICD-10-CM | POA: Diagnosis present

## 2021-02-27 DIAGNOSIS — Z7984 Long term (current) use of oral hypoglycemic drugs: Secondary | ICD-10-CM | POA: Diagnosis not present

## 2021-02-27 DIAGNOSIS — I6522 Occlusion and stenosis of left carotid artery: Secondary | ICD-10-CM | POA: Diagnosis present

## 2021-02-27 DIAGNOSIS — Z8673 Personal history of transient ischemic attack (TIA), and cerebral infarction without residual deficits: Secondary | ICD-10-CM

## 2021-02-27 DIAGNOSIS — Z95828 Presence of other vascular implants and grafts: Secondary | ICD-10-CM | POA: Diagnosis not present

## 2021-02-27 DIAGNOSIS — Z79899 Other long term (current) drug therapy: Secondary | ICD-10-CM | POA: Diagnosis not present

## 2021-02-27 DIAGNOSIS — I2510 Atherosclerotic heart disease of native coronary artery without angina pectoris: Secondary | ICD-10-CM | POA: Diagnosis present

## 2021-02-27 DIAGNOSIS — Z7902 Long term (current) use of antithrombotics/antiplatelets: Secondary | ICD-10-CM | POA: Diagnosis not present

## 2021-02-27 DIAGNOSIS — E7800 Pure hypercholesterolemia, unspecified: Secondary | ICD-10-CM | POA: Diagnosis present

## 2021-02-27 DIAGNOSIS — Z006 Encounter for examination for normal comparison and control in clinical research program: Secondary | ICD-10-CM

## 2021-02-27 DIAGNOSIS — Z7982 Long term (current) use of aspirin: Secondary | ICD-10-CM | POA: Diagnosis not present

## 2021-02-27 HISTORY — PX: TRANSCAROTID ARTERY REVASCULARIZATIONÂ: SHX6778

## 2021-02-27 LAB — GLUCOSE, CAPILLARY
Glucose-Capillary: 85 mg/dL (ref 70–99)
Glucose-Capillary: 88 mg/dL (ref 70–99)
Glucose-Capillary: 95 mg/dL (ref 70–99)
Glucose-Capillary: 146 mg/dL — ABNORMAL HIGH (ref 70–99)

## 2021-02-27 SURGERY — TRANSCAROTID ARTERY REVASCULARIZATION (TCAR)
Anesthesia: General | Site: Neck | Laterality: Left

## 2021-02-27 MED ORDER — SUGAMMADEX SODIUM 200 MG/2ML IV SOLN
INTRAVENOUS | Status: DC | PRN
Start: 2021-02-27 — End: 2021-02-27
  Administered 2021-02-27: 127 mg via INTRAVENOUS

## 2021-02-27 MED ORDER — METOPROLOL TARTRATE 5 MG/5ML IV SOLN: 2.0000 mg | INTRAVENOUS | Status: DC | PRN

## 2021-02-27 MED ORDER — DEXAMETHASONE SODIUM PHOSPHATE 10 MG/ML IJ SOLN
INTRAMUSCULAR | Status: DC | PRN
  Administered 2021-02-27: 10 mg via INTRAVENOUS

## 2021-02-27 MED ORDER — PHENYLEPHRINE 40 MCG/ML (10ML) SYRINGE FOR IV PUSH (FOR BLOOD PRESSURE SUPPORT)
PREFILLED_SYRINGE | INTRAVENOUS | Status: AC
  Filled 2021-02-27: qty 30

## 2021-02-27 MED ORDER — DEXAMETHASONE SODIUM PHOSPHATE 10 MG/ML IJ SOLN
INTRAMUSCULAR | Status: AC
  Filled 2021-02-27: qty 2

## 2021-02-27 MED ORDER — PHENYLEPHRINE HCL-NACL 20-0.9 MG/250ML-% IV SOLN
INTRAVENOUS | Status: DC | PRN
  Administered 2021-02-27: 25 ug/min via INTRAVENOUS
  Administered 2021-02-27: 50 ug/min via INTRAVENOUS

## 2021-02-27 MED ORDER — ONDANSETRON HCL 4 MG/2ML IJ SOLN
INTRAMUSCULAR | Status: AC
  Filled 2021-02-27: qty 4

## 2021-02-27 MED ORDER — HEPARIN 6000 UNIT IRRIGATION SOLUTION
Status: DC | PRN
Start: 2021-02-27 — End: 2021-02-27
  Administered 2021-02-27: 1

## 2021-02-27 MED ORDER — HEPARIN SODIUM (PORCINE) 1000 UNIT/ML IJ SOLN
INTRAMUSCULAR | Status: DC | PRN
  Administered 2021-02-27: 7000 [IU] via INTRAVENOUS

## 2021-02-27 MED ORDER — ROCURONIUM BROMIDE 10 MG/ML (PF) SYRINGE
PREFILLED_SYRINGE | INTRAVENOUS | Status: AC
  Filled 2021-02-27: qty 20

## 2021-02-27 MED ORDER — ALBUTEROL SULFATE HFA 108 (90 BASE) MCG/ACT IN AERS
INHALATION_SPRAY | RESPIRATORY_TRACT | Status: DC | PRN
  Administered 2021-02-27 (×2): 2 via RESPIRATORY_TRACT

## 2021-02-27 MED ORDER — CEFAZOLIN SODIUM-DEXTROSE 2-4 GM/100ML-% IV SOLN
2.0000 g | Freq: Three times a day (TID) | INTRAVENOUS | Status: AC
  Administered 2021-02-27 – 2021-02-28 (×2): 2 g via INTRAVENOUS
  Filled 2021-02-27 (×2): qty 100

## 2021-02-27 MED ORDER — SURGIFLO WITH THROMBIN (HEMOSTATIC MATRIX KIT) OPTIME
TOPICAL | Status: DC | PRN
Start: 2021-02-27 — End: 2021-02-27
  Administered 2021-02-27: 1 via TOPICAL

## 2021-02-27 MED ORDER — SODIUM CHLORIDE 0.9 % IV SOLN: INTRAVENOUS | Status: DC

## 2021-02-27 MED ORDER — HYDROMORPHONE HCL 1 MG/ML IJ SOLN: 0.5000 mg | INTRAMUSCULAR | Status: DC | PRN

## 2021-02-27 MED ORDER — ACETAMINOPHEN 650 MG RE SUPP: 325.0000 mg | RECTAL | Status: DC | PRN

## 2021-02-27 MED ORDER — CEFAZOLIN SODIUM-DEXTROSE 2-4 GM/100ML-% IV SOLN
2.0000 g | INTRAVENOUS | Status: AC
  Administered 2021-02-27: 2 g via INTRAVENOUS
  Filled 2021-02-27: qty 100

## 2021-02-27 MED ORDER — IODIXANOL 320 MG/ML IV SOLN
INTRAVENOUS | Status: DC | PRN
  Administered 2021-02-27: 20 mL

## 2021-02-27 MED ORDER — ALBUTEROL SULFATE HFA 108 (90 BASE) MCG/ACT IN AERS
INHALATION_SPRAY | RESPIRATORY_TRACT | Status: AC
  Filled 2021-02-27: qty 6.7

## 2021-02-27 MED ORDER — INSULIN ASPART 100 UNIT/ML IJ SOLN: 0.0000 [IU] | INTRAMUSCULAR | Status: DC | PRN

## 2021-02-27 MED ORDER — OXYCODONE-ACETAMINOPHEN 5-325 MG PO TABS: 1.0000 | ORAL_TABLET | ORAL | Status: DC | PRN

## 2021-02-27 MED ORDER — MAGNESIUM SULFATE 2 GM/50ML IV SOLN: 2.0000 g | Freq: Every day | INTRAVENOUS | Status: DC | PRN

## 2021-02-27 MED ORDER — LIDOCAINE 2% (20 MG/ML) 5 ML SYRINGE
INTRAMUSCULAR | Status: AC
  Filled 2021-02-27: qty 10

## 2021-02-27 MED ORDER — PANTOPRAZOLE SODIUM 40 MG PO TBEC
40.0000 mg | DELAYED_RELEASE_TABLET | Freq: Every day | ORAL | Status: DC
  Administered 2021-02-27 – 2021-02-28 (×2): 40 mg via ORAL
  Filled 2021-02-27 (×2): qty 1

## 2021-02-27 MED ORDER — EPHEDRINE SULFATE-NACL 50-0.9 MG/10ML-% IV SOSY
PREFILLED_SYRINGE | INTRAVENOUS | Status: DC | PRN
  Administered 2021-02-27: 15 mg via INTRAVENOUS
  Administered 2021-02-27: 5 mg via INTRAVENOUS

## 2021-02-27 MED ORDER — CLOPIDOGREL BISULFATE 75 MG PO TABS: 75.0000 mg | ORAL_TABLET | Freq: Every evening | ORAL | Status: DC

## 2021-02-27 MED ORDER — ORAL CARE MOUTH RINSE: 15.0000 mL | Freq: Once | OROMUCOSAL | Status: AC

## 2021-02-27 MED ORDER — ONDANSETRON HCL 4 MG/2ML IJ SOLN
INTRAMUSCULAR | Status: DC | PRN
  Administered 2021-02-27: 4 mg via INTRAVENOUS

## 2021-02-27 MED ORDER — ALUM & MAG HYDROXIDE-SIMETH 200-200-20 MG/5ML PO SUSP
15.0000 mL | ORAL | Status: DC | PRN
  Administered 2021-02-28: 30 mL via ORAL
  Filled 2021-02-27: qty 30

## 2021-02-27 MED ORDER — HEPARIN SODIUM (PORCINE) 5000 UNIT/ML IJ SOLN
5000.0000 [IU] | Freq: Three times a day (TID) | INTRAMUSCULAR | Status: DC
  Administered 2021-02-28: 5000 [IU] via SUBCUTANEOUS
  Filled 2021-02-27: qty 1

## 2021-02-27 MED ORDER — ATORVASTATIN CALCIUM 40 MG PO TABS
40.0000 mg | ORAL_TABLET | Freq: Every day | ORAL | Status: DC
  Administered 2021-02-27 – 2021-02-28 (×2): 40 mg via ORAL
  Filled 2021-02-27 (×2): qty 1

## 2021-02-27 MED ORDER — POTASSIUM CHLORIDE CRYS ER 20 MEQ PO TBCR: 20.0000 meq | EXTENDED_RELEASE_TABLET | Freq: Every day | ORAL | Status: DC | PRN

## 2021-02-27 MED ORDER — PROTAMINE SULFATE 10 MG/ML IV SOLN
INTRAVENOUS | Status: DC | PRN
  Administered 2021-02-27: 50 mg via INTRAVENOUS

## 2021-02-27 MED ORDER — EPHEDRINE 5 MG/ML INJ
INTRAVENOUS | Status: AC
  Filled 2021-02-27: qty 5

## 2021-02-27 MED ORDER — LABETALOL HCL 5 MG/ML IV SOLN: 10.0000 mg | INTRAVENOUS | Status: DC | PRN

## 2021-02-27 MED ORDER — FENTANYL CITRATE (PF) 100 MCG/2ML IJ SOLN: 25.0000 ug | INTRAMUSCULAR | Status: DC | PRN

## 2021-02-27 MED ORDER — HEPARIN 6000 UNIT IRRIGATION SOLUTION
Status: AC
  Filled 2021-02-27: qty 500

## 2021-02-27 MED ORDER — PROPOFOL 500 MG/50ML IV EMUL
INTRAVENOUS | Status: DC | PRN
  Administered 2021-02-27: 75 ug/kg/min via INTRAVENOUS

## 2021-02-27 MED ORDER — SENNOSIDES-DOCUSATE SODIUM 8.6-50 MG PO TABS: 1.0000 | ORAL_TABLET | Freq: Every evening | ORAL | Status: DC | PRN

## 2021-02-27 MED ORDER — METFORMIN HCL ER 500 MG PO TB24: 500.0000 mg | ORAL_TABLET | Freq: Every day | ORAL | Status: DC

## 2021-02-27 MED ORDER — PHENYLEPHRINE 40 MCG/ML (10ML) SYRINGE FOR IV PUSH (FOR BLOOD PRESSURE SUPPORT)
PREFILLED_SYRINGE | INTRAVENOUS | Status: DC | PRN
  Administered 2021-02-27 (×2): 120 ug via INTRAVENOUS

## 2021-02-27 MED ORDER — PHENOL 1.4 % MT LIQD: 1.0000 | OROMUCOSAL | Status: DC | PRN

## 2021-02-27 MED ORDER — GLYCOPYRROLATE PF 0.2 MG/ML IJ SOSY
PREFILLED_SYRINGE | INTRAMUSCULAR | Status: AC
  Filled 2021-02-27: qty 2

## 2021-02-27 MED ORDER — ONDANSETRON HCL 4 MG/2ML IJ SOLN: 4.0000 mg | Freq: Four times a day (QID) | INTRAMUSCULAR | Status: DC | PRN

## 2021-02-27 MED ORDER — DOCUSATE SODIUM 100 MG PO CAPS
100.0000 mg | ORAL_CAPSULE | Freq: Every day | ORAL | Status: DC
  Administered 2021-02-28: 100 mg via ORAL
  Filled 2021-02-27: qty 1

## 2021-02-27 MED ORDER — LIDOCAINE 2% (20 MG/ML) 5 ML SYRINGE
INTRAMUSCULAR | Status: DC | PRN
  Administered 2021-02-27: 60 mg via INTRAVENOUS

## 2021-02-27 MED ORDER — ROCURONIUM BROMIDE 10 MG/ML (PF) SYRINGE
PREFILLED_SYRINGE | INTRAVENOUS | Status: DC | PRN
  Administered 2021-02-27: 50 mg via INTRAVENOUS

## 2021-02-27 MED ORDER — HYDRALAZINE HCL 20 MG/ML IJ SOLN: 5.0000 mg | INTRAMUSCULAR | Status: DC | PRN

## 2021-02-27 MED ORDER — CHLORHEXIDINE GLUCONATE 0.12 % MT SOLN
15.0000 mL | Freq: Once | OROMUCOSAL | Status: AC
  Administered 2021-02-27: 15 mL via OROMUCOSAL
  Filled 2021-02-27: qty 15

## 2021-02-27 MED ORDER — PROPOFOL 10 MG/ML IV BOLUS
INTRAVENOUS | Status: DC | PRN
  Administered 2021-02-27: 100 mg via INTRAVENOUS

## 2021-02-27 MED ORDER — ASPIRIN EC 81 MG PO TBEC: 81.0000 mg | DELAYED_RELEASE_TABLET | Freq: Every evening | ORAL | Status: DC

## 2021-02-27 MED ORDER — GUAIFENESIN-DM 100-10 MG/5ML PO SYRP: 15.0000 mL | ORAL_SOLUTION | ORAL | Status: DC | PRN

## 2021-02-27 MED ORDER — LACTATED RINGERS IV SOLN: INTRAVENOUS | Status: DC

## 2021-02-27 MED ORDER — AMLODIPINE BESYLATE 5 MG PO TABS
5.0000 mg | ORAL_TABLET | Freq: Every day | ORAL | Status: DC
  Administered 2021-02-27 – 2021-02-28 (×2): 5 mg via ORAL
  Filled 2021-02-27 (×2): qty 1

## 2021-02-27 MED ORDER — FENTANYL CITRATE (PF) 250 MCG/5ML IJ SOLN
INTRAMUSCULAR | Status: DC | PRN
Start: 2021-02-27 — End: 2021-02-27
  Administered 2021-02-27: 50 ug via INTRAVENOUS

## 2021-02-27 MED ORDER — ACETAMINOPHEN 325 MG PO TABS: 325.0000 mg | ORAL_TABLET | ORAL | Status: DC | PRN

## 2021-02-27 MED ORDER — CHLORHEXIDINE GLUCONATE CLOTH 2 % EX PADS: 6.0000 | MEDICATED_PAD | Freq: Once | CUTANEOUS | Status: DC

## 2021-02-27 MED ORDER — LIDOCAINE HCL (PF) 1 % IJ SOLN
INTRAMUSCULAR | Status: AC
  Filled 2021-02-27: qty 30

## 2021-02-27 MED ORDER — PROTAMINE SULFATE 10 MG/ML IV SOLN
INTRAVENOUS | Status: AC
  Filled 2021-02-27: qty 10

## 2021-02-27 MED ORDER — SODIUM CHLORIDE 0.9 % IV SOLN: 500.0000 mL | Freq: Once | INTRAVENOUS | Status: DC | PRN

## 2021-02-27 MED ORDER — 0.9 % SODIUM CHLORIDE (POUR BTL) OPTIME
TOPICAL | Status: DC | PRN
Start: 2021-02-27 — End: 2021-02-27
  Administered 2021-02-27: 1000 mL

## 2021-02-27 MED ORDER — LOSARTAN POTASSIUM 50 MG PO TABS
100.0000 mg | ORAL_TABLET | Freq: Every evening | ORAL | Status: DC
  Administered 2021-02-27: 100 mg via ORAL
  Filled 2021-02-27: qty 2

## 2021-02-27 MED ORDER — ACETAMINOPHEN 500 MG PO TABS
1000.0000 mg | ORAL_TABLET | Freq: Once | ORAL | Status: AC
  Administered 2021-02-27: 1000 mg via ORAL
  Filled 2021-02-27: qty 2

## 2021-02-27 MED ORDER — FENTANYL CITRATE (PF) 250 MCG/5ML IJ SOLN
INTRAMUSCULAR | Status: AC
  Filled 2021-02-27: qty 5

## 2021-02-27 SURGICAL SUPPLY — 53 items
BAG BANDED W/RUBBER/TAPE 36X54 (MISCELLANEOUS) ×2 IMPLANT
BAG COUNTER SPONGE SURGICOUNT (BAG) ×2 IMPLANT
BALLN STERLING RX 5X30X80 (BALLOONS) ×2
BALLOON STERLING RX 5X30X80 (BALLOONS) IMPLANT
CANISTER SUCT 3000ML PPV (MISCELLANEOUS) ×2 IMPLANT
CATH ROBINSON RED A/P 18FR (CATHETERS) ×1 IMPLANT
CATH SUCT 10FR WHISTLE TIP (CATHETERS) ×2 IMPLANT
CLIP VESOCCLUDE MED 6/CT (CLIP) ×2 IMPLANT
CLIP VESOCCLUDE SM WIDE 6/CT (CLIP) ×2 IMPLANT
COVER DOME SNAP 22 D (MISCELLANEOUS) ×2 IMPLANT
COVER PROBE W GEL 5X96 (DRAPES) ×2 IMPLANT
DERMABOND ADVANCED (GAUZE/BANDAGES/DRESSINGS) ×1
DERMABOND ADVANCED .7 DNX12 (GAUZE/BANDAGES/DRESSINGS) ×1 IMPLANT
DRAPE FEMORAL ANGIO 80X135IN (DRAPES) ×2 IMPLANT
DRSG TEGADERM 4X4.75 (GAUZE/BANDAGES/DRESSINGS) ×1 IMPLANT
ELECT REM PT RETURN 9FT ADLT (ELECTROSURGICAL) ×2
ELECTRODE REM PT RTRN 9FT ADLT (ELECTROSURGICAL) ×1 IMPLANT
GAUZE 4X4 16PLY ~~LOC~~+RFID DBL (SPONGE) ×1 IMPLANT
GAUZE SPONGE 4X4 12PLY STRL LF (GAUZE/BANDAGES/DRESSINGS) ×1 IMPLANT
GLOVE SRG 8 PF TXTR STRL LF DI (GLOVE) ×1 IMPLANT
GLOVE SURG POLYISO LF SZ7.5 (GLOVE) ×2 IMPLANT
GLOVE SURG UNDER POLY LF SZ8 (GLOVE) ×2
GOWN STRL REUS W/ TWL LRG LVL3 (GOWN DISPOSABLE) ×2 IMPLANT
GOWN STRL REUS W/ TWL XL LVL3 (GOWN DISPOSABLE) ×1 IMPLANT
GOWN STRL REUS W/TWL LRG LVL3 (GOWN DISPOSABLE) ×4
GOWN STRL REUS W/TWL XL LVL3 (GOWN DISPOSABLE) ×2
GUIDEWIRE ENROUTE 0.014 (WIRE) ×2 IMPLANT
INTRODUCER KIT GALT 7CM (INTRODUCER) ×2
KIT BASIN OR (CUSTOM PROCEDURE TRAY) ×2 IMPLANT
KIT ENCORE 26 ADVANTAGE (KITS) ×2 IMPLANT
KIT INTRODUCER GALT 7 (INTRODUCER) ×1 IMPLANT
KIT TURNOVER KIT B (KITS) ×2 IMPLANT
PACK CAROTID (CUSTOM PROCEDURE TRAY) ×2 IMPLANT
POSITIONER HEAD DONUT 9IN (MISCELLANEOUS) ×2 IMPLANT
SET MICROPUNCTURE 5F STIFF (MISCELLANEOUS) ×1 IMPLANT
SPONGE T-LAP 18X18 ~~LOC~~+RFID (SPONGE) ×1 IMPLANT
STENT TRANSCAROTID SYSTEM 8X40 (Permanent Stent) ×1 IMPLANT
SURGIFLO W/THROMBIN 8M KIT (HEMOSTASIS) ×1 IMPLANT
SUT ETHILON 3 0 PS 1 (SUTURE) ×1 IMPLANT
SUT PROLENE 5 0 C 1 24 (SUTURE) ×4 IMPLANT
SUT SILK 2 0 PERMA HAND 18 BK (SUTURE) ×2 IMPLANT
SUT SILK 2 0 SH (SUTURE) ×3 IMPLANT
SUT VIC AB 3-0 SH 27 (SUTURE) ×4
SUT VIC AB 3-0 SH 27X BRD (SUTURE) ×2 IMPLANT
SUT VIC AB 4-0 PS2 27 (SUTURE) ×2 IMPLANT
SYR 10ML LL (SYRINGE) ×6 IMPLANT
SYR 20ML LL LF (SYRINGE) ×2 IMPLANT
SYR CONTROL 10ML LL (SYRINGE) IMPLANT
SYSTEM TRANSCAROTID NEUROPRTCT (MISCELLANEOUS) ×1 IMPLANT
TOWEL GREEN STERILE (TOWEL DISPOSABLE) ×2 IMPLANT
TRANSCAROTID NEUROPROTECT SYS (MISCELLANEOUS) ×2
WATER STERILE IRR 1000ML POUR (IV SOLUTION) ×2 IMPLANT
WIRE BENTSON .035X145CM (WIRE) ×2 IMPLANT

## 2021-02-27 NOTE — Anesthesia Procedure Notes (Signed)
Procedure Name: Intubation Date/Time: 02/27/2021 11:05 AM Performed by: Minerva Ends, CRNA Pre-anesthesia Checklist: Patient identified, Emergency Drugs available, Suction available and Patient being monitored Patient Re-evaluated:Patient Re-evaluated prior to induction Oxygen Delivery Method: Circle system utilized Preoxygenation: Pre-oxygenation with 100% oxygen Induction Type: IV induction Ventilation: Mask ventilation without difficulty Laryngoscope Size: Mac and 3 Grade View: Grade I Tube type: Oral Tube size: 7.0 mm Number of attempts: 1 Airway Equipment and Method: Stylet and Oral airway Placement Confirmation: ETT inserted through vocal cords under direct vision, positive ETCO2 and breath sounds checked- equal and bilateral Secured at: 21 cm Tube secured with: Tape Dental Injury: Teeth and Oropharynx as per pre-operative assessment

## 2021-02-27 NOTE — Transfer of Care (Signed)
Immediate Anesthesia Transfer of Care Note  Patient: [name]  Procedure(s) Performed: Left Transcarotid Artery Revascularization (Left: Neck)  Patient Location: PACU  Anesthesia Type:General  Level of Consciousness: awake, alert  and oriented  Airway & Oxygen Therapy: Patient Spontanous Breathing and Patient connected to nasal cannula oxygen  Post-op Assessment: Report given to RN and Post -op Vital signs reviewed and stable  Post vital signs: Reviewed and stable  Last Vitals:  Vitals Value Taken Time  BP 95/47 02/27/21 1230  Temp    Pulse 73 02/27/21 1232  Resp 14 02/27/21 1232  SpO2 89 % 02/27/21 1232  Vitals shown include unvalidated device data.  Last Pain:  Vitals:   02/27/21 0856  TempSrc:   PainSc: 0-No pain         Complications: No notable events documented.

## 2021-02-27 NOTE — Progress Notes (Addendum)
[facility] [place]  Subjective  - No complaints   Objective 120/67 66 (!) 97.5 F (36.4 C) 13 98%  Intake/Output Summary (Last 24 hours) at 02/27/2021 1647 Last data filed at 02/27/2021 1213 Gross per 24 hour  Intake 1500 ml  Output 0 ml  Net 1500 ml    Left neck incisions without hematoma No tongue deviation or facial droop General no acute distress  Lungs non labored breathing  Assessment/Planning: S/P left TCAR  Stable post op disposition without neurologic deficits   [name] 02/27/2021 4:47 PM --  Laboratory Lab Results: No results for input(s): WBC, HGB, HCT, PLT in the last 72 hours. BMET No results for input(s): NA, K, CL, CO2, GLUCOSE, BUN, CREATININE, CALCIUM in the last 72 hours.  COAG Lab Results  Component Value Date   INR 1.1 02/24/2021   INR 1.01 10/10/2012   No results found for: PTT  I agree with the above.  The patient is neurologically intact status post left sided TCAR.  There is a bolster suture in the right groin that will need to be removed tomorrow.  [name]

## 2021-02-27 NOTE — Interval H&P Note (Signed)
History and Physical Interval Note: ° °02/27/2021 °10:01 AM ° °[name]  has presented today for surgery, with the diagnosis of Bilateral carotid stenosis.  The various methods of treatment have been discussed with the patient and family. After consideration of risks, benefits and other options for treatment, the patient has consented to  Procedure(s): °Left Transcarotid Artery Revascularization (Left) as a surgical intervention.  The patient's history has been reviewed, patient examined, no change in status, stable for surgery.  I have reviewed the patient's chart and labs.  Questions were answered to the patient's satisfaction.   ° ° °Wells Ermine Spofford ° ° °

## 2021-02-27 NOTE — Anesthesia Procedure Notes (Signed)
Arterial Line Insertion Start/End2/24/2023 9:20 AM, 02/27/2021 9:35 AM Performed by: Lance Coon, CRNA, CRNA  Patient location: Pre-op. Preanesthetic checklist: patient identified, IV checked, site marked, risks and benefits discussed, surgical consent, monitors and equipment checked, pre-op evaluation, timeout performed and anesthesia consent Lidocaine 1% used for infiltration Left, radial was placed Catheter size: 20 G Hand hygiene performed , maximum sterile barriers used  and Seldinger technique used  Attempts: 2 Procedure performed without using ultrasound guided technique. Following insertion, dressing applied and Biopatch. Post procedure assessment: normal  Patient tolerated the procedure well with no immediate complications.

## 2021-02-27 NOTE — Progress Notes (Signed)
Patient arrived to 4E from PACU. Vitals taken and stable. Tele placed and CCMD notified. Alert and oriented. Carotid and groin incisions assessed. Level 0. Patient oriented to unit. Call bell within reach.  [name]

## 2021-02-27 NOTE — Interval H&P Note (Signed)
History and Physical Interval Note:  02/27/2021 10:01 AM  [name]  has presented today for surgery, with the diagnosis of Bilateral carotid stenosis.  The various methods of treatment have been discussed with the patient and family. After consideration of risks, benefits and other options for treatment, the patient has consented to  Procedure(s): Left Transcarotid Artery Revascularization (Left) as a surgical intervention.  The patient's history has been reviewed, patient examined, no change in status, stable for surgery.  I have reviewed the patient's chart and labs.  Questions were answered to the patient's satisfaction.     [name]

## 2021-02-27 NOTE — Discharge Instructions (Signed)
   [facility] [place]  Discharge Instructions   Carotid Surgery  Please refer to the following instructions for your post-procedure care. Your surgeon or physician assistant will discuss any changes with you.  Activity  You are encouraged to walk as much as you can. You can slowly return to normal activities but must avoid strenuous activity and heavy lifting until your doctor tell you it's okay. Avoid activities such as vacuuming or swinging a golf club. You can drive after one week if you are comfortable and you are no longer taking prescription pain medications. It is normal to feel tired for serval weeks after your surgery. It is also normal to have difficulty with sleep habits, eating, and bowel movements after surgery. These will go away with time.  Bathing/Showering  Shower daily after you go home. Do not soak in a bathtub, hot tub, or swim until the incision heals completely.  Incision Care  Shower every day. Clean your incision with mild soap and water. Pat the area dry with a clean towel. You do not need a bandage unless otherwise instructed. Do not apply any ointments or creams to your incision. You may have skin glue on your incision. Do not peel it off. It will come off on its own in about one week. Your incision may feel thickened and raised for several weeks after your surgery. This is normal and the skin will soften over time.   For Men Only: It's okay to shave around the incision but do not shave the incision itself for 2 weeks. It is common to have numbness under your chin that could last for several months.  Diet  Resume your normal diet. There are no special food restrictions following this procedure. A low fat/low cholesterol diet is recommended for all patients with vascular disease. In order to heal from your surgery, it is CRITICAL to get adequate nutrition. Your body requires vitamins, minerals, and protein. Vegetables are the best source of  vitamins and minerals. Vegetables also provide the perfect balance of protein. Processed food has little nutritional value, so try to avoid this.  Medications  Resume taking all of your medications unless your doctor or physician assistant tells you not to. If your incision is causing pain, you may take over-the- counter pain relievers such as acetaminophen (Tylenol). If you were prescribed a stronger pain medication, please be aware these medications can cause nausea and constipation. Prevent nausea by taking the medication with a snack or meal. Avoid constipation by drinking plenty of fluids and eating foods with a high amount of fiber, such as fruits, vegetables, and grains.   Do not take Tylenol if you are taking prescription pain medications.  Follow Up  Our office will schedule a follow up appointment 2-3 weeks following discharge.  Please call us immediately for any of the following conditions  . Increased pain, redness, drainage (pus) from your incision site. . Fever of 101 degrees or higher. . If you should develop stroke (slurred speech, difficulty swallowing, weakness on one side of your body, loss of vision) you should call 911 and go to the nearest emergency room. .  Reduce your risk of vascular disease:  . Stop smoking. If you would like help call [facility] at 1-800-QUIT-NOW (1-800-784-8669) or [place] at 336-586-4000. . Manage your cholesterol . Maintain a desired weight . Control your diabetes . Keep your blood pressure down .  If you have any questions, please call the office at [phone]. 

## 2021-02-27 NOTE — Anesthesia Postprocedure Evaluation (Signed)
Anesthesia Post Note  Patient: [name]  Procedure(s) Performed: Left Transcarotid Artery Revascularization (Left: Neck)     Patient location during evaluation: PACU Anesthesia Type: General Level of consciousness: awake and alert Pain management: pain level controlled Vital Signs Assessment: post-procedure vital signs reviewed and stable Respiratory status: spontaneous breathing, nonlabored ventilation, respiratory function stable and patient connected to nasal cannula oxygen Cardiovascular status: blood pressure returned to baseline and stable Postop Assessment: no apparent nausea or vomiting Anesthetic complications: no   No notable events documented.  Last Vitals:  Vitals:   02/27/21 1300 02/27/21 1315  BP: (!) 107/51 (!) 121/54  Pulse: 63 60  Resp: 17 17  Temp:    SpO2: 96% 96%    Last Pain:  Vitals:   02/27/21 1300  TempSrc:   PainSc: 0-No pain                 Brie Eppard,W. EDMOND

## 2021-02-27 NOTE — Op Note (Signed)
Patient name: [name] MRN: [id number] DOB: [birth year] Sex: female  02/27/2021 Pre-operative Diagnosis: Asymptomatic left carotid stenosis Post-operative diagnosis:  Same Surgeon:  [name] Assistants:  Adonis Housekeeper, PA Procedure:   #1: Left carotid stent (TCAR)   #2: Retrograde flow reversal neuro protection   #3: Ultrasound-guided placement of right common femoral vein cannula Anesthesia:  General Blood Loss:  minimal Specimens:  none  Findings: 80% stenosis, resolved after stenting  Predilation balloon: 5 x 30 Stent:ENROUTE 8 x 40 Flow reversal time: 16 minutes Contrast: 20 cc Dose area: 532 Fluoroscopy time: 2.3 minutes Procedure time: 45 minutes  Indications: This is a 81 year old female with progressive asymptomatic left carotid stenosis.  She comes in today for TCAR.  Due to the distal extent of the lesion as well as her comorbidities, I felt that carotid stenting was the best option for revascularization.  Procedure:  The patient was identified in the holding area and taken to [facility] OR ROOM 16  The patient was then placed supine on the table. general anesthesia was administered.  The patient was prepped and draped in the usual sterile fashion.  A time out was called and antibiotics were administered.  A PA was necessary to expedite the procedure.  The PA assisted with facilitating exposure of the common carotid artery by suction and countertraction.  The PA was also necessary to facilitate arteriotomy closure as well as manual pressure on the groin cannulation site on the right  Ultrasound was used to evaluate the right common femoral artery which was widely patent and easily compressible.  It was cannulated under ultrasound guidance with a micropuncture needle.  A 018 wire was advanced without resistance followed by placement of a micropuncture sheath.  The 018 wire was removed and a Bentson wire was placed.  I then inserted the TCAR cannula which easily flushed and  aspirated.  Next, a supraclavicular transverse incision was made.  Cautery was used divide subcutaneous tissue and platysma muscle.  I developed an avascular plane between the 2 heads of the sternocleidomastoid.  Sharp dissection was then used to dissect out the common carotid artery which was disease-free.  It was encircled with a umbilical tape and a Potts vessel loop.  The patient was fully heparinized.  A 5-0 Prolene U stitch was placed in the adventitia of the carotid artery and then the artery was cannulated with a micropuncture needle.  A 018 wire was inserted up to 6 cm.  A micropuncture sheath was then inserted to 3 cm.  A contrast injection was then performed to locate the bifurcation and confirmed the 80% stenosis.  At this point I confirmed that the ACT was greater than 250 and that her hemodynamics were appropriate.  A TCAR timeout was called.  I elected to preeclampsia artery.  The vessel loop was tightened.  Next, the carotid Amplatz was inserted up to the carotid bifurcation.  The micropuncture sheath was removed and the TCAR sheath was placed.  Flow reversal tubing was connected.  Flow reversal was confirmed with a saline flush.  Next the TCAR sheath was sutured into position with 2 silk 2-0 sutures.  Using the detector at 30 degrees LAO, additional angiography was performed.  This showed that there was no access related complications.  I then inserted the 018 wire along with the 5 x 30 predilation balloon.  The wire was easily advanced across the stenosis and placed in the distal internal carotid artery.  The lesion was then predilated  for 30 seconds.  There was a mild hemodynamic response.  The balloon was removed and the stent was placed.  This was a8 x 40 ENROUTE.  It was deployed across the lesion.  I waited 2 minutes and then performed completion angiography which revealed resolution of the stenosis.  The wire was then removed.  Flow reversal was discontinued and the blood was returned to  the patient through the femoral sheath.  Next, the sheath was removed and the arteriotomy closed by securing the previously placed Prolene suture.  Hand-held Doppler was used to evaluate the signals in the common carotid artery which were appropriate.  The heparin was reversed with 50 mg of protamine.  The sheath in the groin was then removed and manual pressure was held for hemostasis.  The carotid incision was irrigated.  Hemostasis was achieved.  The platysma was reapproximated with 3-0 Vicryl and the skin was closed with 3-0 Vicryl followed by Dermabond.  There was still some oozing from the femoral cannulation site, and so a bolstered 2-0 nylon stitch was placed for hemostasis.  The patient was then successfully extubated, found to be neurologically intact and taken recovery room in stable condition.   Disposition: To PACU stable.   Juleen China, M.D., [facility] [place] Office: [phone] Pager:  [phone]

## 2021-02-28 LAB — BASIC METABOLIC PANEL
Anion gap: 7 (ref 5–15)
BUN: 19 mg/dL (ref 8–23)
CO2: 21 mmol/L — ABNORMAL LOW (ref 22–32)
Calcium: 8.5 mg/dL — ABNORMAL LOW (ref 8.9–10.3)
Chloride: 110 mmol/L (ref 98–111)
Creatinine, Ser: 1.22 mg/dL — ABNORMAL HIGH (ref 0.44–1.00)
GFR, Estimated: 45 mL/min — ABNORMAL LOW (ref >60)
Glucose, Bld: 119 mg/dL — ABNORMAL HIGH (ref 70–99)
Potassium: 4.3 mmol/L (ref 3.5–5.1)
Sodium: 138 mmol/L (ref 135–145)

## 2021-02-28 LAB — CBC
HCT: 33.7 % — ABNORMAL LOW (ref 36.0–46.0)
Hemoglobin: 10.9 g/dL — ABNORMAL LOW (ref 12.0–15.0)
MCH: 29.1 pg (ref 26.0–34.0)
MCHC: 32.3 g/dL (ref 30.0–36.0)
MCV: 90.1 fL (ref 80.0–100.0)
Platelets: 233 10*3/uL (ref 150–400)
RBC: 3.74 MIL/uL — ABNORMAL LOW (ref 3.87–5.11)
RDW: 16.5 % — ABNORMAL HIGH (ref 11.5–15.5)
WBC: 13 10*3/uL — ABNORMAL HIGH (ref 4.0–10.5)
nRBC: 0 % (ref 0.0–0.2)

## 2021-02-28 LAB — LIPID PANEL
Cholesterol: 123 mg/dL (ref 0–200)
HDL: 28 mg/dL — ABNORMAL LOW (ref >40)
LDL Cholesterol: 74 mg/dL (ref 0–99)
Total CHOL/HDL Ratio: 4.4 RATIO
Triglycerides: 104 mg/dL (ref <150)
VLDL: 21 mg/dL (ref 0–40)

## 2021-02-28 MED ORDER — OXYCODONE-ACETAMINOPHEN 5-325 MG PO TABS: 1.0000 | ORAL_TABLET | Freq: Four times a day (QID) | ORAL | 0 refills | Status: AC | PRN

## 2021-02-28 NOTE — Progress Notes (Addendum)
[facility]  Subjective  - doing well without new complaints   Objective 117/60 70 98.1 F (36.7 C) (Oral) 20 94%  Intake/Output Summary (Last 24 hours) at 02/28/2021 0830 Last data filed at 02/27/2021 1213 Gross per 24 hour  Intake 1500 ml  Output 0 ml  Net 1500 ml    Left carotid incision healing well without hematoma Right groin "red robin" removed dry guaze placed over the stick site.  No bleeding or hematoma. No tongue deviation or facial droop Lungs non labored breathing   Assessment/Planning: POD # 1 s/p Left TCAR for asymptomatic carotid stenosis   Tolerating PO's, voiding, will ambulate prior to discharge No neurologic deficits Tol PO's, voided, and will ambulate Cr elevated 02/05/21 1.3 mg/dl, today 1.22.  No documentation of CKD.  She has had a renal stent placement left by Dr. Trula Slade 06/19/19.  She has an  occluded right renal artery. Plan for discharge home in stable condition with a 4 week f/u and carotid duplex.    Roxy Horseman 02/28/2021 8:30 AM --  Laboratory Lab Results: Recent Labs    02/28/21 0634  WBC 13.0*  HGB 10.9*  HCT 33.7*  PLT 233   BMET Recent Labs    02/28/21 0634  NA 138  K 4.3  CL 110  CO2 21*  GLUCOSE 119*  BUN 19  CREATININE 1.22*  CALCIUM 8.5*    COAG Lab Results  Component Value Date   INR 1.1 02/24/2021   INR 1.01 10/10/2012   No results found for: PTT  I have seen and evaluated the patient. I agree with the PA note as documented above.  Postop day 1 status post left TCAR for asymptomatic carotid stenosis.  Looks good this morning.  Neurologically intact.  Discussed aspirin statin Plavix at discharge.  We will arrange follow-up in 1 month with carotid duplex for surveillance.  She feels she is at her neurologic baseline.  Marty Heck, MD [facility] Office: [phone]

## 2021-02-28 NOTE — Progress Notes (Signed)
Pt discharged  IV removed. Has some bleeding so I had to hold pressure for a while.  Bleeding stopped  Pt educated and daughter picked up the patient.  I transported the patient in a wheelchair and into her daughter's car

## 2021-02-28 NOTE — Progress Notes (Signed)
PHARMACIST LIPID MONITORING   [name] is a 81 y.o. female admitted on 02/27/2021 with carotid stenosis.  Pharmacy has been consulted to optimize lipid-lowering therapy with the indication of secondary prevention for clinical ASCVD.  Recent Labs:  Lipid Panel (last 6 months):   Lab Results  Component Value Date   CHOL 123 02/28/2021   TRIG 104 02/28/2021   HDL 28 (L) 02/28/2021   CHOLHDL 4.4 02/28/2021   VLDL 21 02/28/2021   LDLCALC 74 02/28/2021    Hepatic function panel (last 6 months):   Lab Results  Component Value Date   AST 18 02/24/2021   ALT 15 02/24/2021   ALKPHOS 74 02/24/2021   BILITOT 0.5 02/24/2021    SCr (since admission):   Serum creatinine: 1.22 mg/dL (H) 02/28/21 [id number] Estimated creatinine clearance: 33.3 mL/min (A)  Current therapy and lipid therapy tolerance Current lipid-lowering therapy: Atorvastatin 40 mg QD Previous lipid-lowering therapies (if applicable): na Documented or reported allergies or intolerances to lipid-lowering therapies (if applicable): na  Assessment:   Patient prefers no changes in lipid-lowering therapy at this time due to close to goal  Plan:    1.Statin intensity (high intensity recommended for all patients regardless of the LDL):  No statin changes. The patient is already on a high intensity statin.  2.Add ezetimibe (if any one of the following):   Not indicated at this time.  3.Refer to lipid clinic:   No  4.Follow-up with:  Primary care provider - Carlisle Beers, MD  5.Follow-up labs after discharge:  No changes in lipid therapy, repeat a lipid panel in one year.     Ursula Beath, PharmD 02/28/2021, 12:44 PM

## 2021-03-02 ENCOUNTER — Encounter (HOSPITAL_COMMUNITY): Payer: Self-pay | Admitting: Surgery

## 2021-03-02 LAB — POCT ACTIVATED CLOTTING TIME
Activated Clotting Time: 137 seconds
Activated Clotting Time: 257 seconds

## 2021-03-02 NOTE — Discharge Summary (Signed)
[facility] Discharge Summary   Patient ID:  [name] MRN: [id number] DOB/AGE: 1940/07/22 81 y.o.  Admit date: 02/27/2021 Discharge date: 02/28/21 Date of Surgery: 02/27/2021 Surgeon: Surgeon(s): Nada Libman, MD  Admission Diagnosis: Carotid stenosis, asymptomatic, left [I65.22]  Discharge Diagnoses:  Carotid stenosis, asymptomatic, left [I65.22]  Secondary Diagnoses: Past Medical History:  Diagnosis Date   Carotid artery occlusion    Diabetes mellitus    borderline , no longer treated /w medicine    Hyperlipidemia    Hypertension    Peripheral vascular disease (HCC)    emboli of left foot from left superficial femoral and popliteal occulsive disease   Renal artery stenosis (HCC)    s/p left renal artery stent 06/19/19   Stroke (HCC) 01/04/2006    Procedure(s): Left Transcarotid Artery Revascularization  Discharged Condition: good  HPI: 81 y/o female with asymptomatic left ICA stenosis > 80%.  According to Dr. Coralee Pesa review she will undergo TCAR intervention secondary to the distal extent of the lesion as well as her co morbidities.   Hospital Course:  [name] is a 81 y.o. female is S/P  Procedure(s): Left Transcarotid Artery Revascularization Post day 1 she was stable without neurologic deficits, tol PO's, left neck incision healing well without hematoma.  Red Robins removed from right groin vein access.  No active bleeding and no hematoma.  Stable post op disposition f/u in 4 weeks with carotid duplex.    Significant Diagnostic Studies: CBC Lab Results  Component Value Date   WBC 13.0 (H) 02/28/2021   HGB 10.9 (L) 02/28/2021   HCT 33.7 (L) 02/28/2021   MCV 90.1 02/28/2021   PLT 233 02/28/2021    BMET    Component Value Date/Time   NA 138 02/28/2021 0634   K 4.3 02/28/2021 0634   CL 110 02/28/2021 0634   CO2 21 (L) 02/28/2021 0634   GLUCOSE 119 (H) 02/28/2021 0634   BUN 19 02/28/2021 0634   CREATININE 1.22 (H)  02/28/2021 0634   CALCIUM 8.5 (L) 02/28/2021 0634   GFRNONAA 45 (L) 02/28/2021 0634   GFRAA >60 05/30/2019 0355   COAG Lab Results  Component Value Date   INR 1.1 02/24/2021   INR 1.01 10/10/2012     Disposition:  Discharge to :Home Discharge Instructions     Call MD for:  redness, tenderness, or signs of infection (pain, swelling, bleeding, redness, odor or green/yellow discharge around incision site)   Complete by: As directed    Call MD for:  severe or increased pain, loss or decreased feeling  in affected limb(s)   Complete by: As directed    Call MD for:  temperature >100.5   Complete by: As directed    Resume previous diet   Complete by: As directed       Allergies as of 02/28/2021   No Known Allergies      Medication List     TAKE these medications    amLODipine 5 MG tablet Commonly known as: NORVASC Take 5 mg by mouth in the morning.   aspirin 81 MG EC tablet Take 81 mg by mouth every evening.   clopidogrel 75 MG tablet Commonly known as: PLAVIX Take 75 mg by mouth every evening.   losartan 50 MG tablet Commonly known as: COZAAR Take 100 mg by mouth every evening.   metFORMIN 500 MG 24 hr tablet Commonly known as: GLUCOPHAGE-XR Take 500 mg by mouth every evening.   oxyCODONE-acetaminophen 5-325 MG tablet  Commonly known as: PERCOCET/ROXICET Take 1 tablet by mouth every 6 (six) hours as needed for moderate pain.   simvastatin 80 MG tablet Commonly known as: ZOCOR Take 80 mg by mouth at bedtime.   SYSTANE COMPLETE OP Place 1 drop into the left eye 3 (three) times daily as needed (dry/irritated eyes).   vitamin B-12 1000 MCG tablet Commonly known as: CYANOCOBALAMIN Take 1,000 mcg by mouth in the morning.   Vitamin D3 50 MCG (2000 UT) Tabs Take 2,000 Units by mouth 4 (four) times a week.       Verbal and written Discharge instructions given to the patient. Wound care per Discharge AVS  Follow-up Information     Nada Libman, MD  Follow up in 4 week(s).   Specialties: Vascular Surgery, Cardiology Why: Office will call you to arrange your appt (sent) Contact information: [address] [place] [address] [phone]                 Signed: Mosetta Pigeon 03/02/2021, 10:06 AM  --- For VQI Registry use --- Instructions: Press F2 to tab through selections.  Delete question if not applicable.   Modified Rankin score at D/C (0-6): Rankin Score=0  IV medication needed for:  1. Hypertension: No 2. Hypotension: No  Post-op Complications: No  1. Post-op CVA or TIA: No  If yes: Event classification (right eye, left eye, right cortical, left cortical, verterobasilar, other):   If yes: Timing of event (intra-op, <6 hrs post-op, >=6 hrs post-op, unknown):   2. CN injury: No  If yes: CN  injuried   3. Myocardial infarction: No  If yes: Dx by (EKG or clinical, Troponin):   4.  CHF: No  5.  Dysrhythmia (new): No  6. Wound infection: No  7. Reperfusion symptoms: No  8. Return to OR: No  If yes: return to OR for (bleeding, neurologic, other CEA incision, other):   Discharge medications: Statin use:  Yes ASA use:  Yes Beta blocker use:  No  for medical reason not indicated ACE-Inhibitor use:  No  for medical reason not indicated P2Y12 Antagonist use: [ ]  None, [x ] Plavix, [ ]  Plasugrel, [ ]  Ticlopinine, [ ]  Ticagrelor, [ ]  Other, [ ]  No for medical reason, [ ]  Non-compliant, [ ]  Not-indicated Anti-coagulant use:  [ ]  None, [ ]  Warfarin, [ ]  Rivaroxaban, [ ]  Dabigatran, [ ]  Other, [ ]  No for medical reason, [ ]  Non-compliant, [ ]  Not-indicated

## 2021-03-28 ENCOUNTER — Other Ambulatory Visit: Payer: Self-pay

## 2021-03-28 DIAGNOSIS — I6523 Occlusion and stenosis of bilateral carotid arteries: Secondary | ICD-10-CM

## 2021-04-06 ENCOUNTER — Ambulatory Visit (INDEPENDENT_AMBULATORY_CARE_PROVIDER_SITE_OTHER): Payer: Medicare Other | Admitting: Surgery

## 2021-04-06 ENCOUNTER — Encounter: Payer: Self-pay | Admitting: Surgery

## 2021-04-06 ENCOUNTER — Ambulatory Visit (HOSPITAL_COMMUNITY)
Admission: RE | Admit: 2021-04-06 | Discharge: 2021-04-06 | Disposition: A | Discharge status: Home / Self Care | Payer: Medicare Other | Source: Ambulatory Visit | Attending: Surgery | Admitting: Surgery

## 2021-04-06 VITALS — BP 126/67 | HR 74 | Temp 97.9°F | Resp 20 | Ht 62.0 in | Wt 139.0 lb

## 2021-04-06 DIAGNOSIS — I6523 Occlusion and stenosis of bilateral carotid arteries: Secondary | ICD-10-CM | POA: Diagnosis present

## 2021-04-06 DIAGNOSIS — I6522 Occlusion and stenosis of left carotid artery: Secondary | ICD-10-CM

## 2021-04-06 DIAGNOSIS — I70213 Atherosclerosis of native arteries of extremities with intermittent claudication, bilateral legs: Secondary | ICD-10-CM

## 2021-04-06 DIAGNOSIS — I701 Atherosclerosis of renal artery: Secondary | ICD-10-CM

## 2021-04-06 NOTE — Progress Notes (Signed)
? ?Patient name: [name] MRN: [id number] DOB: Jun 25, 1940 Sex: female ? ?REASON FOR VISIT:  ? ? ? Post op ? ?HISTORY OF PRESENT ILLNESS:  ? ?[name] is a 81 y.o. female,  who is s/p left femoropopliteal artery bypass graft using saphenous vein on 10/12/2012, left superficial femoral artery stent 03/11/2005, left popliteal artery stent 04/30/2009, and right superficial femoral artery stent on 10/17/2009.  At her last visit, she had a drop in her ABIs and was scheduled for angiography.  This occurred on 05/29/2019.  She was found to have in-stent stenosis within the right superficial femoral artery which was greater than 90%.  I treated this with laser atherectomy and drug-coated balloon angioplasty.  There was a native popliteal artery stenosis that was treated with laser atherectomy and drug-coated balloon angioplasty.  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 ?  ?She is without claudication or rest pain, or nonhealing wounds.  Her renal function has been stable.  She developed progressive left carotid stenosis.  On 02/27/2021 she underwent left-sided TCAR for a 80% stenosis that resolved after stenting.  This was for asymptomatic stenosis.  She was discharged home the following day.  She is back today for follow-up without complaints ?  ?  ?  ?The patient continues to take dual antiplatelet therapy.  She is on Zocor for hypercholesterolemia.  She takes an ARB for hypertension. ? ?CURRENT MEDICATIONS:  ? ? ?Current Outpatient Medications  ?Medication Sig Dispense Refill  ? amLODipine (NORVASC) 5 MG tablet Take 5 mg by mouth in the morning.    ? aspirin 81 MG EC tablet Take 81 mg by mouth every evening.    ? Cholecalciferol (VITAMIN D3) 50 MCG (2000 UT) TABS Take 2,000 Units by mouth 4 (four) times a week.    ? clopidogrel (PLAVIX) 75  MG tablet Take 75 mg by mouth every evening.    ? losartan (COZAAR) 50 MG tablet Take 100 mg by mouth every evening.    ? metFORMIN (GLUCOPHAGE-XR) 500 MG 24 hr tablet Take 500 mg by mouth every evening.    ? oxyCODONE-acetaminophen (PERCOCET/ROXICET) 5-325 MG tablet Take 1 tablet by mouth every 6 (six) hours as needed for moderate pain. 12 tablet 0  ? Propylene Glycol (SYSTANE COMPLETE OP) Place 1 drop into the left eye 3 (three) times daily as needed (dry/irritated eyes).    ? simvastatin (ZOCOR) 80 MG tablet Take 80 mg by mouth at bedtime.    ? vitamin B-12 (CYANOCOBALAMIN) 1000 MCG tablet Take 1,000 mcg by mouth in the morning.    ? ?No current facility-administered medications for this visit.  ? ? ?REVIEW OF SYSTEMS:  ? ?[X]  denotes positive finding, [ ]  denotes negative finding ?Cardiac  Comments:  ?Chest pain or chest pressure:    ?Shortness of breath upon exertion:    ?Short of breath when lying flat:    ?Irregular heart rhythm:    ?Constitutional    ?Fever or chills:    ? ? ?PHYSICAL EXAM:  ? ?Vitals:  ? 04/06/21 1445  ?BP: 126/67  ?Pulse: 74  ?Resp: 20  ?Temp: 97.9 ?F (36.6 ?C)  ?SpO2: 94%  ?Weight: 139 lb (63 kg)  ?Height: 5\' 2"  (1.575 m)  ? ? ?GENERAL: The patient is a well-nourished female, in no acute distress. The vital signs are documented  above. ?CARDIOVASCULAR: There is a regular rate and rhythm. ?PULMONARY: Non-labored respirations ?Incision has healed nicely ? ?STUDIES:  ? ?Carotid duplex: ? ?Right Carotid: Velocities in the right ICA are consistent with a 40-59%  ?stenosis  ?               (low end of range).  ? ?Left         There is no evidence of stenosis in the left ICA. Patent  ?carotid  ?Carotid:     stent.  ? ?Vertebrals:  Bilateral vertebral arteries demonstrate antegrade flow.  ?Subclavians: Right subclavian artery flow was disturbed. Normal flow  ?             hemodynamics were seen in the left subclavian artery.  ? ?MEDICAL ISSUES:  ? ?Status post left carotid stent which is widely  patent.  The patient will return in 6 months with repeat ultrasound ? ?Renal: Status post left renal artery stenting, repeat ultrasound in 6 months ? ?PAD: She will have bilateral lower extremity duplex and ABIs when she returns in 6 months. ? ?She will continue aspirin Plavix and statin as long as she tolerates this. ? ?Annamarie Major, IV, MD, FACS ?[facility] ?Tel [phone] ?Pager [phone] ? ? ?  ?

## 2021-04-15 ENCOUNTER — Other Ambulatory Visit: Payer: Self-pay | Admitting: *Deleted

## 2021-04-15 DIAGNOSIS — I6522 Occlusion and stenosis of left carotid artery: Secondary | ICD-10-CM

## 2021-04-15 DIAGNOSIS — I6523 Occlusion and stenosis of bilateral carotid arteries: Secondary | ICD-10-CM

## 2021-04-15 DIAGNOSIS — I701 Atherosclerosis of renal artery: Secondary | ICD-10-CM

## 2021-04-15 DIAGNOSIS — I70213 Atherosclerosis of native arteries of extremities with intermittent claudication, bilateral legs: Secondary | ICD-10-CM

## 2021-04-15 DIAGNOSIS — Z95828 Presence of other vascular implants and grafts: Secondary | ICD-10-CM

## 2022-01-15 ENCOUNTER — Other Ambulatory Visit: Payer: Self-pay | Admitting: *Deleted

## 2022-01-15 DIAGNOSIS — I701 Atherosclerosis of renal artery: Secondary | ICD-10-CM

## 2022-01-15 DIAGNOSIS — I70213 Atherosclerosis of native arteries of extremities with intermittent claudication, bilateral legs: Secondary | ICD-10-CM

## 2022-01-15 DIAGNOSIS — Z95828 Presence of other vascular implants and grafts: Secondary | ICD-10-CM

## 2022-02-01 ENCOUNTER — Ambulatory Visit (INDEPENDENT_AMBULATORY_CARE_PROVIDER_SITE_OTHER)
Admission: RE | Admit: 2022-02-01 | Discharge: 2022-02-01 | Disposition: A | Discharge status: Home / Self Care | Payer: Medicare Other | Source: Ambulatory Visit | Attending: Surgery | Admitting: Surgery

## 2022-02-01 ENCOUNTER — Ambulatory Visit (INDEPENDENT_AMBULATORY_CARE_PROVIDER_SITE_OTHER): Payer: Medicare Other | Admitting: Physician Assistant

## 2022-02-01 ENCOUNTER — Ambulatory Visit (HOSPITAL_COMMUNITY)
Admission: RE | Admit: 2022-02-01 | Discharge: 2022-02-01 | Disposition: A | Discharge status: Home / Self Care | Payer: Medicare Other | Source: Ambulatory Visit | Attending: Surgery | Admitting: Surgery

## 2022-02-01 VITALS — BP 143/73 | HR 68 | Temp 97.8°F | Resp 20 | Ht 62.0 in | Wt 141.1 lb

## 2022-02-01 DIAGNOSIS — I6523 Occlusion and stenosis of bilateral carotid arteries: Secondary | ICD-10-CM

## 2022-02-01 DIAGNOSIS — I701 Atherosclerosis of renal artery: Secondary | ICD-10-CM | POA: Insufficient documentation

## 2022-02-01 DIAGNOSIS — I70213 Atherosclerosis of native arteries of extremities with intermittent claudication, bilateral legs: Secondary | ICD-10-CM

## 2022-02-01 DIAGNOSIS — Z95828 Presence of other vascular implants and grafts: Secondary | ICD-10-CM | POA: Diagnosis present

## 2022-02-01 DIAGNOSIS — I779 Disorder of arteries and arterioles, unspecified: Secondary | ICD-10-CM | POA: Diagnosis not present

## 2022-02-01 DIAGNOSIS — F17200 Nicotine dependence, unspecified, uncomplicated: Secondary | ICD-10-CM

## 2022-02-01 DIAGNOSIS — I6522 Occlusion and stenosis of left carotid artery: Secondary | ICD-10-CM | POA: Diagnosis not present

## 2022-02-01 DIAGNOSIS — I714 Abdominal aortic aneurysm, without rupture, unspecified: Secondary | ICD-10-CM

## 2022-02-01 LAB — VAS US ABI WITH/WO TBI
Left ABI: 0.8
Right ABI: 0.58

## 2022-02-01 NOTE — Progress Notes (Signed)
HISTORY AND PHYSICAL     CC:  follow up. Requesting Provider:  Carlisle Beers, MD  HPI: This is a 82 y.o. female who is here today for follow up for PAD.  Pt has hx of  left femoropopliteal artery bypass graft using saphenous vein on 10/12/2012, left superficial femoral artery stent 03/11/2005, left popliteal artery stent 04/30/2009, and right superficial femoral artery stent on 10/17/2009.  she subsequently underwent angiogram with laser atherectomy of right SFA and drug coated balloon angioplasty of the right SFA on 05/29/2019 by Dr. Arita Miss. On 06/19/2019, she underwent left renal artery stent by Dr. Trula Slade.  She has hx of left TCAR on 02/27/2021 by Dr. Trula Slade for asymptomatic carotid artery stenosis.   Pt was last seen 04/06/2021 and at that time, she was doing well overall.    The pt returns today for follow up.  She states that she is doing well overall.  She states that she does get occasional cramping in her legs with walking, but she really doesn't walk enough to elicit the cramping.  She mostly walks around in her house. She does not have any non healing wounds or rest pain.  She denies any amaurosis fugax, unilateral weakness, numbness, paralysis or speech difficulties.  She is compliant with her asa/statin/plavix.  She continues to smoke.  She states that her PCP has taken her off of the Metformin after she had her labs drawn.  She follows up with PCP next month.    The pt is on a statin for cholesterol management.    The pt is on an aspirin.    Other AC:  Plavix The pt is on CCB, ARB for hypertension.  The pt does  have diabetes. Tobacco hx:  current-really no interest in quitting  Pt does not have family hx of AAA.  Past Medical History:  Diagnosis Date   Carotid artery occlusion    Diabetes mellitus    borderline , no longer treated /w medicine    Hyperlipidemia    Hypertension    Peripheral vascular disease ([place])    emboli of left foot from left superficial femoral and  popliteal occulsive disease   Renal artery stenosis ([facility])    s/p left renal artery stent 06/19/19   Stroke ([place]) 01/04/2006    Past Surgical History:  Procedure Laterality Date   ABDOMINAL AORTAGRAM N/A 10/03/2012   Procedure: ABDOMINAL Maxcine Ham;  Surgeon: Serafina Mitchell, MD;  Location: [facility] CATH LAB;  Service: Cardiovascular;  Laterality: N/A;   ABDOMINAL AORTOGRAM N/A 06/19/2019   Procedure: ABDOMINAL AORTOGRAM;  Surgeon: Serafina Mitchell, MD;  Location: [place] CV LAB;  Service: Cardiovascular;  Laterality: N/A;   ABDOMINAL AORTOGRAM W/LOWER EXTREMITY Bilateral 05/29/2019   Procedure: ABDOMINAL AORTOGRAM W/LOWER EXTREMITY;  Surgeon: Serafina Mitchell, MD;  Location: [place] CV LAB;  Service: Cardiovascular;  Laterality: Bilateral;   FEMORAL-POPLITEAL BYPASS GRAFT Left 10/12/2012   Procedure: BYPASS GRAFT FEMORAL-POPLITEAL ARTERY;  Surgeon: Mal Misty, MD;  Location: [place];  Service: Vascular;  Laterality: Left;   INGUINAL HERNIA REPAIR     right   left popliteal stent   04/30/2009   LOWER EXTREMITY ANGIOGRAM Bilateral 10/03/2012   Procedure: LOWER EXTREMITY ANGIOGRAM;  Surgeon: Serafina Mitchell, MD;  Location: [facility] CATH LAB;  Service: Cardiovascular;  Laterality: Bilateral;   PERIPHERAL VASCULAR ATHERECTOMY Right 05/29/2019   Procedure: PERIPHERAL VASCULAR ATHERECTOMY;  Surgeon: Serafina Mitchell, MD;  Location: [place] CV LAB;  Service: Cardiovascular;  Laterality: Right;  laser  SFA   PERIPHERAL VASCULAR BALLOON ANGIOPLASTY Right 05/29/2019   Procedure: PERIPHERAL VASCULAR BALLOON ANGIOPLASTY;  Surgeon: Serafina Mitchell, MD;  Location: [place] CV LAB;  Service: Cardiovascular;  Laterality: Right;  SFA   PERIPHERAL VASCULAR INTERVENTION Left 06/19/2019   Procedure: PERIPHERAL VASCULAR INTERVENTION;  Surgeon: Serafina Mitchell, MD;  Location: [place] CV LAB;  Service: Cardiovascular;  Laterality: Left;  RENAL   right SFA stent   01/05/2008   SFA STENT  04/04/2005    LEFT   TRANSCAROTID ARTERY REVASCULARIZATION  Left 02/27/2021   Procedure: Left Transcarotid Artery Revascularization;  Surgeon: Serafina Mitchell, MD;  Location: [facility] OR;  Service: Vascular;  Laterality: Left;   TUBAL LIGATION     VASCULAR SURGERY      No Known Allergies  Current Outpatient Medications  Medication Sig Dispense Refill   amLODipine (NORVASC) 5 MG tablet Take 5 mg by mouth in the morning.     aspirin 81 MG EC tablet Take 81 mg by mouth every evening.     Cholecalciferol (VITAMIN D3) 50 MCG (2000 UT) TABS Take 2,000 Units by mouth 4 (four) times a week.     clopidogrel (PLAVIX) 75 MG tablet Take 75 mg by mouth every evening.     losartan (COZAAR) 50 MG tablet Take 100 mg by mouth every evening.     metFORMIN (GLUCOPHAGE-XR) 500 MG 24 hr tablet Take 500 mg by mouth every evening.     oxyCODONE-acetaminophen (PERCOCET/ROXICET) 5-325 MG tablet Take 1 tablet by mouth every 6 (six) hours as needed for moderate pain. 12 tablet 0   Propylene Glycol (SYSTANE COMPLETE OP) Place 1 drop into the left eye 3 (three) times daily as needed (dry/irritated eyes).     simvastatin (ZOCOR) 80 MG tablet Take 80 mg by mouth at bedtime.     vitamin B-12 (CYANOCOBALAMIN) 1000 MCG tablet Take 1,000 mcg by mouth in the morning.     No current facility-administered medications for this visit.    Family History  Problem Relation Age of Onset   Coronary artery disease Mother    Heart disease Mother        After age 7   Heart attack Mother    Coronary artery disease Sister    Heart disease Sister        Before  age 23 -  Heart Transplant   Cancer Sister        Kidney   Heart attack Sister    Heart disease Father        After age 74   Other Father        amputation    Social History   Socioeconomic History   Marital status: Married    Spouse name: Not on file   Number of children: Not on file   Years of education: Not on file   Highest education level: Not on file  Occupational  History   Not on file  Tobacco Use   Smoking status: Every Day    Packs/day: 0.50    Years: 50.00    Total pack years: 25.00    Types: Cigarettes   Smokeless tobacco: Never  Vaping Use   Vaping Use: Never used  Substance and Sexual Activity   Alcohol use: No   Drug use: No   Sexual activity: Not on file  Other Topics Concern   Not on file  Social History Narrative   Not on file   Social Determinants of Health  Financial Resource Strain: Not on file  Food Insecurity: Not on file  Transportation Needs: Not on file  Physical Activity: Not on file  Stress: Not on file  Social Connections: Not on file  Intimate Partner Violence: Not on file     REVIEW OF SYSTEMS:   [X]  denotes positive finding, [ ]  denotes negative finding Cardiac  Comments:  Chest pain or chest pressure:    Shortness of breath upon exertion:    Short of breath when lying flat:    Irregular heart rhythm:        Vascular    Pain in calf, thigh, or hip brought on by ambulation:    Pain in feet at night that wakes you up from your sleep:     Blood clot in your veins:    Leg swelling:         Pulmonary    Oxygen at home:    Productive cough:     Wheezing:         Neurologic    Sudden weakness in arms or legs:     Sudden numbness in arms or legs:     Sudden onset of difficulty speaking or slurred speech:    Temporary loss of vision in one eye:     Problems with dizziness:         Gastrointestinal    Blood in stool:     Vomited blood:         Genitourinary    Burning when urinating:     Blood in urine:        Psychiatric    Major depression:         Hematologic    Bleeding problems:    Problems with blood clotting too easily:        Skin    Rashes or ulcers:        Constitutional    Fever or chills:      PHYSICAL EXAMINATION:  Today's Vitals   02/01/22 1013  BP: (!) 143/73  Pulse: 68  Resp: 20  Temp: 97.8 F (36.6 C)  TempSrc: Temporal  SpO2: 97%  Weight: 141 lb 1.6 oz  (64 kg)  Height: 5\' 2"  (1.575 m)   Body mass index is 25.81 kg/m.   General:  WDWN in NAD; vital signs documented above Gait: Not observed HENT: WNL, normocephalic Pulmonary: normal non-labored breathing , without wheezing Cardiac: regular HR, without carotid bruits Abdomen: soft, NT; aortic pulse is not palpable Skin: without rashes Vascular Exam/Pulses:  Right Left  Radial 2+ (normal) 2+ (normal)  Femoral 2+ (normal) 2+ (normal)  Popliteal Unable to palpate Unable to palpate  DP monophasic Monophasic   PT monophasic monophasic  Peroneal monophasic Monophasic    Extremities: without ischemic changes, without Gangrene , without cellulitis; without open wounds Musculoskeletal: no muscle wasting or atrophy  Neurologic: A&O X 3 Psychiatric:  The pt has Normal affect.   Non-Invasive Vascular Imaging:   ABI's/TBI's on 02/01/2022 Right:  0.58/0.26 - Great toe pressure: 35 Left:  0.80/absent - Great toe pressure:  absent  Arterial duplex on 02/01/2022: +-----------+-------+-----+--------------+----------+----------------------  RIGHT     PSV    RatioStenosis      Waveform  Comments                          cm/s                                                       +-----------+-------+-----+--------------+----------+----------------------  CFA Prox   147                       biphasic                         +-----------+-------+-----+--------------+----------+----------------------  CFA Distal 76                        biphasic                         +-----------+-------+-----+--------------+----------+----------------------  DFA       218         50-74%        biphasic stenosis                                       +-----------+-------+-----+--------------+----------+----------------------  SFA Prox   119                       biphasic                         +-----------+-------+-----+--------------+----------+----------------------  POP  Prox   47                        monophasic                       +-----------+-------+-----+--------------+----------+----------------------  POP Distal 56                        monophasic                           +-----------+-------+-----+--------------+----------+----------------------  TP Trunk   36                        monophasic                       +-----------+-------+-----+--------------+----------+----------------------  ATA Distal 51                        monophasic                       +-----------+-------+-----+--------------+----------+----------------------  PTA Mid                occluded                                       +-----------+-------+-----+--------------+----------+----------------------  PTA Distal 30                        monophasicReconstitutes via collateral             +-----------+-------+-----+--------------+----------+----------------------   PERO Distal28                        monophasic                       +-----------+-------+-----+--------------+----------+----------------------    A focal velocity elevation of 218  cm/s was obtained at [facility] with a VR of 2.9. Findings are characteristic of 50-74% stenosis.    Right Stent(s):  +---------------+--++----------++  Prox to Stent  61biphasic    +---------------+--++----------++  Proximal Stent 46monophasic  +---------------+--++----------++  Mid Stent      48monophasic  +---------------+--++----------++  Distal Stent   60monophasic  +---------------+--++----------++  Distal to Stent93monophasic  +---------------+--++----------++   +----------+--------+-----+---------------+-------------------+--------+  LEFT     PSV cm/sRatioStenosis       Waveform           Comments  +----------+--------+-----+---------------+-------------------+--------+  CFA Distal56                          monophasic                    +----------+--------+-----+---------------+-------------------+--------+  DFA      347          75-99% stenosismonophasic                   +----------+--------+-----+---------------+-------------------+--------+  POP Distal49                          monophasic                   +----------+--------+-----+---------------+-------------------+--------+  TP Trunk  59                          monophasic                   +----------+--------+-----+---------------+-------------------+--------+  ATA Distal16                          Dampened monophasic          +----------+--------+-----+---------------+-------------------+--------+  PTA Distal48                          monophasic                   +----------+--------+-----+---------------+-------------------+--------+   A focal velocity elevation of 347 cm/s was obtained at [facility] with a VR of  6.2. Findings are characteristic of 75-99% stenosis.    Left Graft #1: Fem-pop  +--------------------+--------+--------+----------+--------+                     PSV cm/sStenosisWaveform  Comments  +--------------------+--------+--------+----------+--------+  Inflow             56              monophasic          +--------------------+--------+--------+----------+--------+  Proximal Anastomosis33              biphasic            +--------------------+--------+--------+----------+--------+  Proximal Graft      24              biphasic            +--------------------+--------+--------+----------+--------+  Mid Graft           29              monophasic          +--------------------+--------+--------+----------+--------+  Distal Graft        45              monophasic          +--------------------+--------+--------+----------+--------+  Distal Anastomosis  51              monophasic          +--------------------+--------+--------+----------+--------+  Outflow             49              monophasic          +--------------------+--------+--------+----------+--------+   Summary:  Right:  Patent right superficial femoral artery stent without evidence of hemodynamically significant stenosis.   50-74% stenosis of the deep femoral artery.   Occluded mid posterior tibial artery with reconstitution distally via collateral flow.   Left:  Patent left femoral-popliteal bypass graft without evidence of hemodynamically significant stenosis.   75-99% stenosis of the deep femoral artery. Low velocities in the graft could suggest a threatened bypass.    Previous ABI's/TBI's on 01/01/2021: Right:  0.64/0.36 - Great toe pressure: 60 Left:  0.79/0.24 - Great toe pressure: 40   Previous arterial duplex on 01/01/2021: +----------+--------+-----+---------------+--------+--------+  RIGHT    PSV cm/sRatioStenosis       WaveformComments  +----------+--------+-----+---------------+--------+--------+  CFA Prox  112                         biphasic          +----------+--------+-----+---------------+--------+--------+  CFA Distal82                          biphasic          +----------+--------+-----+---------------+--------+--------+  DFA      80                          biphasic          +----------+--------+-----+---------------+--------+--------+  SFA Prox  178          30-49% stenosisbiphasic          +----------+--------+-----+---------------+--------+--------+  SFA Mid                                       stent     +----------+--------+-----+---------------+--------+--------+  SFA Distal                                    stent     +----------+--------+-----+---------------+--------+--------+  POP Prox  105                         biphasic          +----------+--------+-----+---------------+--------+--------+    Right Stent(s):  +---------------+--------+---------------+--------+--------+  SFA            PSV cm/sStenosis       WaveformComments  +---------------+--------+---------------+--------+--------+  Prox to Stent  68                     biphasic          +---------------+--------+---------------+--------+--------+  Proximal Stent 204     50-99% stenosisbiphasic          +---------------+--------+---------------+--------+--------+  Mid Stent      35                     biphasic          +---------------+--------+---------------+--------+--------+  Distal Stent  37                     biphasic          +---------------+--------+---------------+--------+--------+  Distal to Stent49                     biphasic          +---------------+--------+---------------+--------+--------+   Left Graft #1: Femoral-popliteal  +--------------------+--------+--------+--------+--------+                     PSV cm/sStenosisWaveformComments  +--------------------+--------+--------+--------+--------+  Inflow             118             biphasic          +--------------------+--------+--------+--------+--------+  Proximal Anastomosis106             biphasic          +--------------------+--------+--------+--------+--------+  Proximal Graft      36              biphasic          +--------------------+--------+--------+--------+--------+  Mid Graft           44              biphasic          +--------------------+--------+--------+--------+--------+  Distal Graft        69              biphasic          +--------------------+--------+--------+--------+--------+  Distal Anastomosis  84              biphasic          +--------------------+--------+--------+--------+--------+  Outflow            63              biphasic          +--------------------+--------+--------+--------+--------+   Previous renal artery duplex 01/01/2021: Left: Normal size of left kidney. LRV flow present. Left renal  artery stent was not  clearly visualized, however >60% stenosis is noted in the origin and proximal renal artery segments.   Previous carotid duplex on 04/06/2021: Right 40-59% ICA stenosis Left:  patent ICA stent   ASSESSMENT/PLAN:: 82 y.o. female here for follow up for PAD with hx of left femoropopliteal artery bypass graft using saphenous vein on 10/12/2012, left superficial femoral artery stent 03/11/2005, left popliteal artery stent 04/30/2009, and right superficial femoral artery stent on 10/17/2009.  she subsequently underwent angiogram with laser atherectomy of right SFA and drug coated balloon angioplasty of the right SFA on 05/29/2019 by Dr. Arita Miss. On 06/19/2019, she underwent left renal artery stent by Dr. Trula Slade.  She has hx of left TCAR on 02/27/2021 by Dr. Trula Slade for asymptomatic carotid artery stenosis.   PAD: -pt has monophasic doppler flow bilateral feet.  She has decreased velocities throughout the LLE bypass, which can indicate a threatened bypass graft.  Discussed undergoing arteriogram to evaluate this, however, pt would like to return in 6 months with repeat duplex.  I discussed with pt that if her bypass goes down, she can be at risk for limb loss.  She would still like to wait.  Discussed that if she develops pain in her feet or non healing wounds, she should call us sooner.  -continue asa/statin/plavix  Renal artery stenosis: -left renal artery stent with velocities that suggest > 60% stenosis, which is unchanged from  previous arteriogram.  Blood pressure today is 143/73 and she is on ARB and CCB.    Carotid artery stenosis -carotid duplex in April 2023 revealed 40-59% ICA stenosis and the left ICA stent was patent.  She remains asymptomatic.  Plan for carotid duplex in one year. -discussed if she develops any neurological sx, to call 911 or go to the ER.  Current smoker: -discussed importance of smoking cessation with risk of heart attack, stroke, limb loss.  She does not really have any  interest in quitting at this time.    Incidental finding AAA -on renal artery duplex today, aorta measured 3.4cm.  when she returns in 6 months, will get formal AAA study.  Discussed that risk of rupture is low currently but not zero.  Discussed that if she develops severe sudden onset of abdominal or back pain, to call 911 as this could be indication of rupture.     Leontine Locket, [facility] [phone]  Clinic MD:   Trula Slade

## 2022-02-03 ENCOUNTER — Other Ambulatory Visit: Payer: Self-pay

## 2022-02-03 DIAGNOSIS — I714 Abdominal aortic aneurysm, without rupture, unspecified: Secondary | ICD-10-CM

## 2022-02-03 DIAGNOSIS — I70213 Atherosclerosis of native arteries of extremities with intermittent claudication, bilateral legs: Secondary | ICD-10-CM

## 2022-05-23 IMAGING — CT CT ANGIO NECK
2 of 7 series · 8 of 33 positions shown · non-contrast
Comparison: None.

CLINICAL DATA: Carotid artery stenosis



[Series 4: cta neck · axial · 0.47mm/px · z∈[-108,-38]mm · 2 of 107 slices shown]
[im 36/107  soft-tissue]
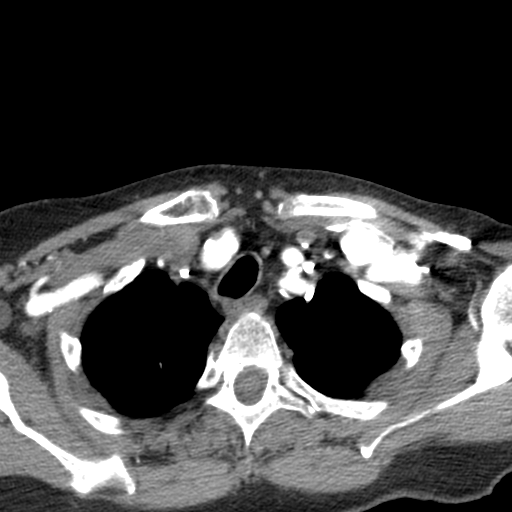
[im 71/107  soft-tissue]
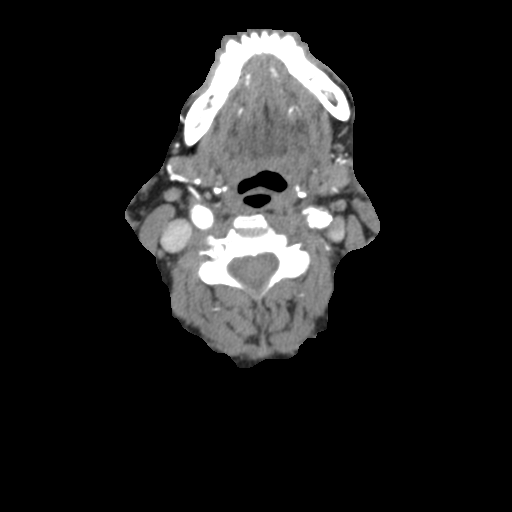

[Series 6: ax thin · axial · 0.39mm/px · z∈[-148,+2]mm · 6 of 211 slices shown]
[im 31/211  soft-tissue]
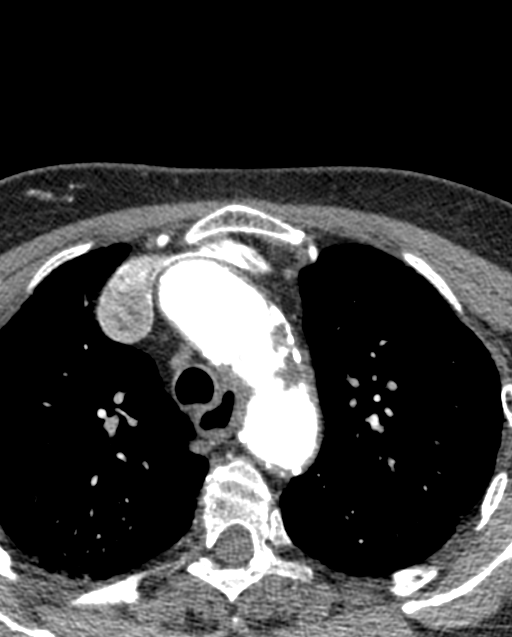
[im 61/211  bone]
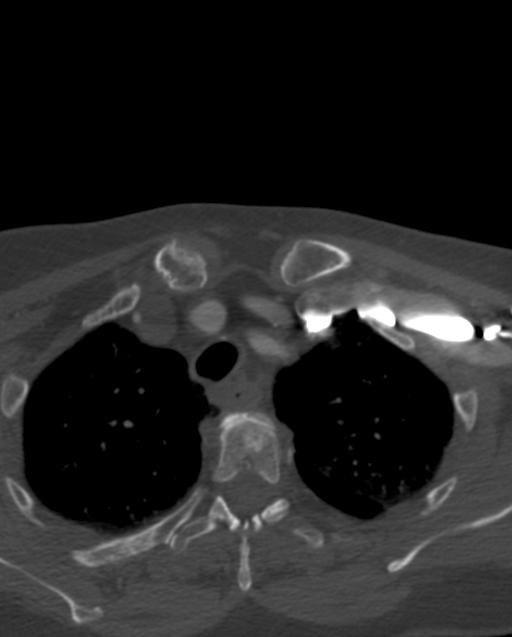
[im 91/211  soft-tissue]
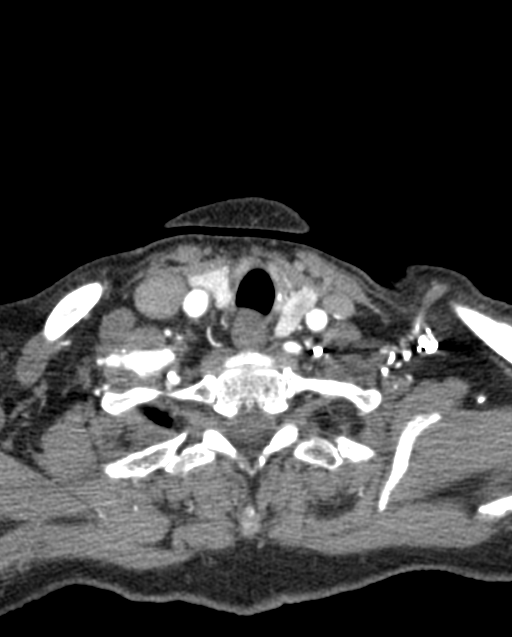
[im 121/211  bone]
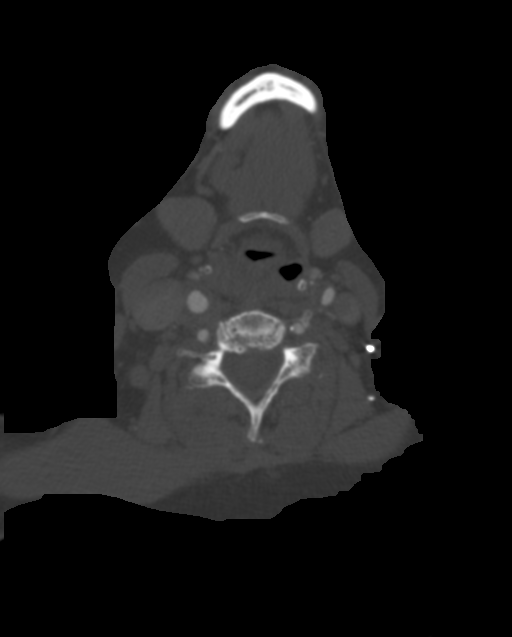
[im 151/211  soft-tissue]
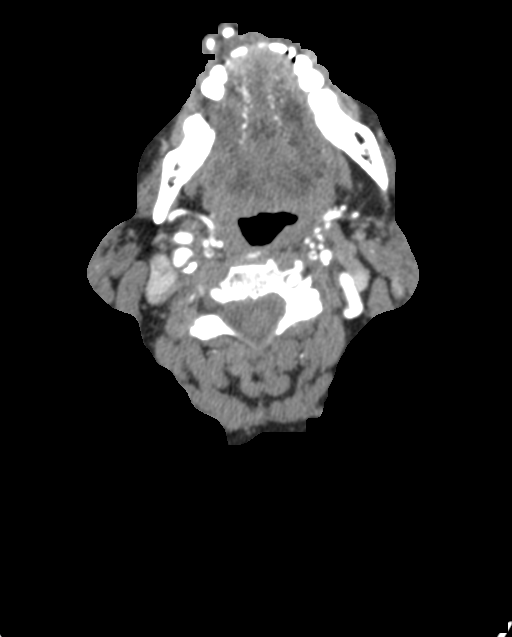
[im 181/211  bone]
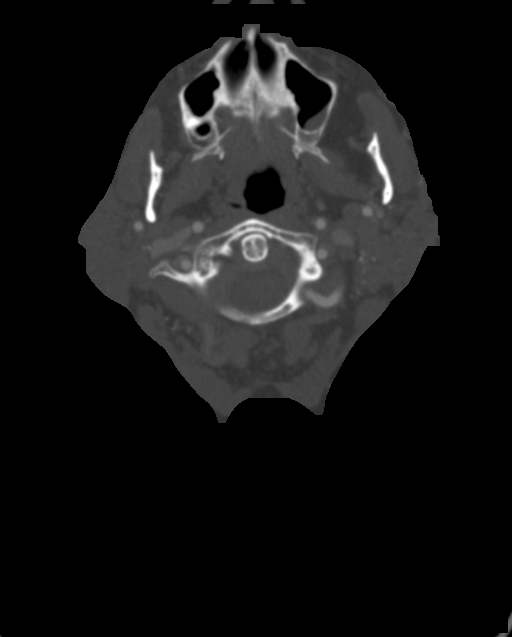

[8 of 33 positions shown; findings below may reference images not displayed]

RADIATION DOSE REDUCTION: This exam was performed according to the
departmental dose-optimization program which includes automated
exposure control, adjustment of the mA and/or kV according to
patient size and/or use of iterative reconstruction technique.

CONTRAST:  60mL OMNIPAQUE IOHEXOL 350 MG/ML SOLN
FINDINGS: Aortic arch: Standard branching. Imaged portion shows no evidence of
aneurysm or dissection. Aortic atherosclerosis, with significant
calcified and noncalcified plaque, which extends into the branch
vessels without significant narrowing.

Right carotid system: No evidence of dissection, stenosis (50% or
greater) or occlusion. Calcified and noncalcified plaque at the
bifurcation and in the proximal right ICA causes approximately 30%
luminal narrowing.

Left carotid system: 65% luminal narrowing in the proximal left ICA,
secondary to calcified and noncalcified plaque. No evidence of
dissection or occlusion. Calcified and noncalcified plaque in the
left common carotid causes 50-60% luminal narrowing (series 6, image
93).

Vertebral arteries: Approximately 60% luminal narrowing in the right
V1 segment (series 6, image 123), secondary to calcified and
noncalcified plaque. No other hemodynamically significant stenosis
in the extracranial right vertebral artery. The left vertebral
artery is patent from its origin to the skull base, without
significant luminal narrowing.

Skeleton: Degenerative changes in the cervical spine, with trace
anterolisthesis of C2 on C3 and C3 on C4. Straightening of the
normal cervical lordosis. No acute osseous abnormality.

Other neck: Multiple hypoenhancing nodules in the thyroid, the
largest of which measures up to 1.2 cm. The neck is otherwise
negative.

Upper chest: Paraseptal and centrilobular emphysema. No focal
pulmonary opacity or pleural effusion.
IMPRESSION: 1. 65% stenosis in the proximal left ICA, secondary to calcified and
noncalcified plaque. Plaque in the left common carotid artery also
causes 50-60% luminal narrowing.
2. 60% stenosis in the right V1 segment, which is otherwise patent.
No other hemodynamically significant stenosis in the neck.
3. Multiple hypoenhancing nodules in the thyroid, the largest of
which measures up to 1.2 cm. Given the lesions' size, no follow-up
is recommended. (Reference: [HOSPITAL]. [DATE]):
143-50) Aortic Atherosclerosis (Q0BZL-FU8.8) and Emphysema
([id number].T).
4.

## 2022-07-29 NOTE — Progress Notes (Signed)
HISTORY AND PHYSICAL     CC:  follow up. Requesting Provider:  Marinell Blight, MD  HPI: This is a 82 y.o. female who is here today for follow up for PAD.  Pt has hx of  left femoropopliteal artery bypass graft using saphenous vein on 10/12/2012 by Dr. Myra Gianotti, left superficial femoral artery stent 03/11/2005, left popliteal artery stent 04/30/2009, and right superficial femoral artery stent on 10/17/2009.  she subsequently underwent angiogram with laser atherectomy of right SFA and drug coated balloon angioplasty of the right SFA on 05/29/2019 by Dr. Cory Roughen.  On 06/19/2019, she underwent left renal artery stent by Dr. Myra Gianotti.  She has hx of left TCAR on 02/27/2021 by Dr. Myra Gianotti for asymptomatic carotid artery stenosis.   Pt was last seen 02/01/2022 and at that time, she was doing well overall.  She would get occasional cramping with walking but she wasn't really walking enough to elicit cramping.  She was not having any non healing wounds or rest pain.  She was doing well from her carotid standpoint and no neurological sx.  Her bypass had decreased velocities but she did not want to undergo arteriogram and wanted to repeat the u/s in 6 months.  There was an incidental finding of small AAA on renal artery duplex as well. She was continuing to smoke with no interest in quitting.   The pt returns today for follow up.  She states that she does not have any claudication, rest pain or non healing wounds.  She does have some left ankle swelling and pain in the ankle.  She does not have any new abdominal or back pain.  She states that her blood pressure is not normally high.  She is compliant with here asa/statin/plavix. She continues to smoke.    The pt is on a statin for cholesterol management.    The pt is on an aspirin.    Other AC:  Plavix The pt is on CCB, ARB for hypertension.  The pt is is on medication for diabetes. Tobacco hx:  current  Pt does not have family hx of AAA.  Past Medical History:   Diagnosis Date   Carotid artery occlusion    Diabetes mellitus    borderline , no longer treated /w medicine    Hyperlipidemia    Hypertension    Peripheral vascular disease (HCC)    emboli of left foot from left superficial femoral and popliteal occulsive disease   Renal artery stenosis (HCC)    s/p left renal artery stent 06/19/19   Stroke (HCC) 01/04/2006    Past Surgical History:  Procedure Laterality Date   ABDOMINAL AORTAGRAM N/A 10/03/2012   Procedure: ABDOMINAL Ronny Flurry;  Surgeon: Nada Libman, MD;  Location: [facility] CATH LAB;  Service: Cardiovascular;  Laterality: N/A;   ABDOMINAL AORTOGRAM N/A 06/19/2019   Procedure: ABDOMINAL AORTOGRAM;  Surgeon: Nada Libman, MD;  Location: [facility] INVASIVE CV LAB;  Service: Cardiovascular;  Laterality: N/A;   ABDOMINAL AORTOGRAM W/LOWER EXTREMITY Bilateral 05/29/2019   Procedure: ABDOMINAL AORTOGRAM W/LOWER EXTREMITY;  Surgeon: Nada Libman, MD;  Location: [facility] INVASIVE CV LAB;  Service: Cardiovascular;  Laterality: Bilateral;   FEMORAL-POPLITEAL BYPASS GRAFT Left 10/12/2012   Procedure: BYPASS GRAFT FEMORAL-POPLITEAL ARTERY;  Surgeon: Pryor Ochoa, MD;  Location: [facility] OR;  Service: Vascular;  Laterality: Left;   INGUINAL HERNIA REPAIR     right   left popliteal stent   04/30/2009   LOWER EXTREMITY ANGIOGRAM Bilateral 10/03/2012   Procedure: LOWER EXTREMITY ANGIOGRAM;  Surgeon: Nada Libman, MD;  Location: [facility] CATH LAB;  Service: Cardiovascular;  Laterality: Bilateral;   PERIPHERAL VASCULAR ATHERECTOMY Right 05/29/2019   Procedure: PERIPHERAL VASCULAR ATHERECTOMY;  Surgeon: Nada Libman, MD;  Location: [facility] INVASIVE CV LAB;  Service: Cardiovascular;  Laterality: Right;  laser  SFA   PERIPHERAL VASCULAR BALLOON ANGIOPLASTY Right 05/29/2019   Procedure: PERIPHERAL VASCULAR BALLOON ANGIOPLASTY;  Surgeon: Nada Libman, MD;  Location: [facility] INVASIVE CV LAB;  Service: Cardiovascular;  Laterality: Right;  SFA   PERIPHERAL VASCULAR  INTERVENTION Left 06/19/2019   Procedure: PERIPHERAL VASCULAR INTERVENTION;  Surgeon: Nada Libman, MD;  Location: [facility] INVASIVE CV LAB;  Service: Cardiovascular;  Laterality: Left;  RENAL   right SFA stent   01/05/2008   SFA STENT  04/04/2005   LEFT   TRANSCAROTID ARTERY REVASCULARIZATION  Left 02/27/2021   Procedure: Left Transcarotid Artery Revascularization;  Surgeon: Nada Libman, MD;  Location: [facility] OR;  Service: Vascular;  Laterality: Left;   TUBAL LIGATION     VASCULAR SURGERY      No Known Allergies  Current Outpatient Medications  Medication Sig Dispense Refill   amLODipine (NORVASC) 5 MG tablet Take 5 mg by mouth in the morning.     aspirin 81 MG EC tablet Take 81 mg by mouth every evening.     Cholecalciferol (VITAMIN D3) 50 MCG (2000 UT) TABS Take 2,000 Units by mouth 4 (four) times a week.     clopidogrel (PLAVIX) 75 MG tablet Take 75 mg by mouth every evening.     losartan (COZAAR) 50 MG tablet Take 100 mg by mouth every evening.     metFORMIN (GLUCOPHAGE-XR) 500 MG 24 hr tablet Take 500 mg by mouth every evening.     oxyCODONE-acetaminophen (PERCOCET/ROXICET) 5-325 MG tablet Take 1 tablet by mouth every 6 (six) hours as needed for moderate pain. 12 tablet 0   Propylene Glycol (SYSTANE COMPLETE OP) Place 1 drop into the left eye 3 (three) times daily as needed (dry/irritated eyes).     simvastatin (ZOCOR) 80 MG tablet Take 80 mg by mouth at bedtime.     vitamin B-12 (CYANOCOBALAMIN) 1000 MCG tablet Take 1,000 mcg by mouth in the morning.     No current facility-administered medications for this visit.    Family History  Problem Relation Age of Onset   Coronary artery disease Mother    Heart disease Mother        After age 61   Heart attack Mother    Coronary artery disease Sister    Heart disease Sister        Before  age 46 -  Heart Transplant   Cancer Sister        Kidney   Heart attack Sister    Heart disease Father        After age 31   Other Father         amputation    Social History   Socioeconomic History   Marital status: Married    Spouse name: Not on file   Number of children: Not on file   Years of education: Not on file   Highest education level: Not on file  Occupational History   Not on file  Tobacco Use   Smoking status: Every Day    Current packs/day: 0.50    Average packs/day: 0.5 packs/day for 50.0 years (25.0 ttl pk-yrs)    Types: Cigarettes    Passive exposure: Current (Daughter Is a smoker)  Smokeless tobacco: Never  Vaping Use   Vaping status: Never Used  Substance and Sexual Activity   Alcohol use: No   Drug use: No   Sexual activity: Not on file  Other Topics Concern   Not on file  Social History Narrative   Not on file   Social Determinants of Health   Financial Resource Strain: Not on file  Food Insecurity: Not on file  Transportation Needs: Not on file  Physical Activity: Not on file  Stress: Not on file  Social Connections: Not on file  Intimate Partner Violence: Not on file     REVIEW OF SYSTEMS:   [X]  denotes positive finding, [ ]  denotes negative finding Cardiac  Comments:  Chest pain or chest pressure:    Shortness of breath upon exertion:    Short of breath when lying flat:    Irregular heart rhythm:        Vascular    Pain in calf, thigh, or hip brought on by ambulation:    Pain in feet at night that wakes you up from your sleep:     Blood clot in your veins:    Leg swelling:         Pulmonary    Oxygen at home:    Productive cough:     Wheezing:         Neurologic    Sudden weakness in arms or legs:     Sudden numbness in arms or legs:     Sudden onset of difficulty speaking or slurred speech:    Temporary loss of vision in one eye:     Problems with dizziness:         Gastrointestinal    Blood in stool:     Vomited blood:         Genitourinary    Burning when urinating:     Blood in urine:        Psychiatric    Major depression:         Hematologic     Bleeding problems:    Problems with blood clotting too easily:        Skin    Rashes or ulcers:        Constitutional    Fever or chills:      PHYSICAL EXAMINATION:  Today's Vitals   08/02/22 1002  BP: (!) 167/74  Pulse: 60  Resp: 18  Temp: (!) 97.3 F (36.3 C)  TempSrc: Temporal  SpO2: 99%  Weight: 142 lb 4.8 oz (64.5 kg)  Height: 5\' 2"  (1.575 m)   Body mass index is 26.03 kg/m.   General:  WDWN in NAD; vital signs documented above Gait: Not observed HENT: WNL, normocephalic Pulmonary: normal non-labored breathing , without wheezing Cardiac: regular HR, without carotid bruits Abdomen: soft, NT; aortic pulse is palpable Skin: without rashes Vascular Exam/Pulses:  Right Left  Radial 2+ (normal) 2+ (normal)  Femoral 2+ (normal) 2+ (normal)  Popliteal Unable to palpate Unable to palpate  DP monophasic monophasic  PT monophasic monophasic  Peroneal monophasic Faint monophasic   Extremities: without ischemic changes, without Gangrene , without cellulitis; without open wounds Musculoskeletal: no muscle wasting or atrophy  Neurologic: A&O X 3 Psychiatric:  The pt has Normal affect.   Non-Invasive Vascular Imaging:   ABI's/TBI's on 08/02/2022: Right:  0.56/0.46 - Great toe pressure: 78 Left:  0.63/0.38 - Great toe pressure: 65  Arterial duplex on 08/02/2022: Left Graft #1: Femoral-below knee popliteal  +--------------------+--------+--------+----------+--------+  PSV cm/sStenosisWaveform  Comments  +--------------------+--------+--------+----------+--------+  Inflow             45              monophasic          +--------------------+--------+--------+----------+--------+  Proximal Anastomosis29              monophasic          +--------------------+--------+--------+----------+--------+  Proximal Graft      24              monophasic          +--------------------+--------+--------+----------+--------+  Mid Graft            21              monophasic          +--------------------+--------+--------+----------+--------+  Distal Graft        29              monophasic          +--------------------+--------+--------+----------+--------+  Distal Anastomosis  32              monophasic          +--------------------+--------+--------+----------+--------+  Outflow            48              monophasic          +--------------------+--------+--------+----------+--------+   Summary:  Left: Patent left femoral-below knee popliteal bypass graft with low  velocities, suggestive of possible threatened graft. When compared to  prior study 02/01/22, velocities are diffusely lower throughout the graft  on today's exam. Area of dilatation of the   vein graft at the proximal end, just distal to the proximal anastomosis,  measuring 1.1 x 1.1cm   AAA duplex 08/02/2022: Abdominal Aorta Findings:  +-----------+-------+----------+----------+----------+--------+--------+  Location  AP (cm)Trans (cm)PSV (cm/s)Waveform  ThrombusComments  +-----------+-------+----------+----------+----------+--------+--------+  Proximal  3.10   3.80      55                          saccular  +-----------+-------+----------+----------+----------+--------+--------+  Mid       2.42   2.56      41                                    +-----------+-------+----------+----------+----------+--------+--------+  Distal    1.93   2.45      31                                    +-----------+-------+----------+----------+----------+--------+--------+  RT CIA Prox1.7    1.6       128       monophasic                  +-----------+-------+----------+----------+----------+--------+--------+  LT CIA Prox1.6    1.2       23        monophasic                  +-----------+-------+----------+----------+----------+--------+--------+      Previous ABI's/TBI's on 02/01/2022: Right:   0.58/0.26 - Great toe pressure: 35 Left:  0.80/unable to insonate - Great toe pressure:  unable to insonate  Previous arterial duplex: Renal artery duplex 02/01/2022: Left: LRV flow present.  Unable to visualize left renal artery stent, however elevated velocities suggest >60% stenosis.    Incidental finding: Aneurysmal dilatation of the proximal aorta, largest  measurement= 3.40cm (transverse).   BLE arterial duplex 02/01/2022: Summary:  Right:  Patent right superficial femoral artery stent without evidence of hemodynamically significant stenosis.   50-74% stenosis of the deep femoral artery.   Occluded mid posterior tibial artery with reconstitution distally via collateral flow.   Left:  Patent left femoral-popliteal bypass graft without evidence of hemodynamically significant stenosis.   75-99% stenosis of the deep femoral artery. Low velocities in the graft could suggest a threatened bypass.   Carotid duplex 02/01/2022: Right:  40-59% Left: patent without stenosis   ASSESSMENT/PLAN:: 82 y.o. female here for follow up for PAD with hx of  left femoropopliteal artery bypass graft using saphenous vein on 10/12/2012 by Dr. Myra Gianotti, left superficial femoral artery stent 03/11/2005, left popliteal artery stent 04/30/2009, and right superficial femoral artery stent on 10/17/2009.  she subsequently underwent angiogram with laser atherectomy of right SFA and drug coated balloon angioplasty of the right SFA on 05/29/2019 by Dr. Cory Roughen.  On 06/19/2019, she underwent left renal artery stent by Dr. Myra Gianotti.  She has hx of left TCAR on 02/27/2021 by Dr. Myra Gianotti for asymptomatic carotid artery stenosis.   PAD: -LLE duplex today reveals decreased velocities and are lower than they were 6 months ago.  Discussed scheduling angiogram to evaluate as bypass is threatened for failure given decreased velocities.  She has palpable femoral pulses bilaterally.  Her ABI has decreased on the left and ABI on the  right is essentially unchanged but TBI improved.  This will be scheduled with Dr. Myra Gianotti.  -BLE arterial duplex and ABI also ordered for January 2025.   Carotid artery stenosis: -pt will have carotid duplex in January 2025.  The right ICA stenosis was 40-59% in January 2024. She remains asymptomatic  Incidental finding of AAA on renal artery duplex 02/01/2022 -duplex today reveals that AAA has increased in size at 3.8cm from 3.4cm 6 months ago and saccular in nature.   -Discussed that if she develops severe sudden onset of abdominal or back pain, to call 911 as this could be indication of rupture.  -follow up in 6 months with AAA duplex.  If increased in size at that time, may need CTA to evaluate given saccular nature.    Renal artery stenosis -BP today is elevated but she states that it does not run that high at home.  -will check renal artery duplex in January 2025.   Current smoker: -discussed importance of smoking cessation.   -she does not appear interested in quitting.  -continue asa/statin/plavix     Doreatha Massed, [facility] [phone]  Clinic MD:   Lenell Antu

## 2022-07-29 NOTE — H&P (View-Only) (Signed)
 HISTORY AND PHYSICAL     CC:  follow up. Requesting Provider:  Marinell Blight, MD  HPI: This is a 82 y.o. female who is here today for follow up for PAD.  Pt has hx of  left femoropopliteal artery bypass graft using saphenous vein on 10/12/2012 by Dr. Myra Gianotti, left superficial femoral artery stent 03/11/2005, left popliteal artery stent 04/30/2009, and right superficial femoral artery stent on 10/17/2009.  she subsequently underwent angiogram with laser atherectomy of right SFA and drug coated balloon angioplasty of the right SFA on 05/29/2019 by Dr. Cory Roughen.  On 06/19/2019, she underwent left renal artery stent by Dr. Myra Gianotti.  She has hx of left TCAR on 02/27/2021 by Dr. Myra Gianotti for asymptomatic carotid artery stenosis.   Pt was last seen 02/01/2022 and at that time, she was doing well overall.  She would get occasional cramping with walking but she wasn't really walking enough to elicit cramping.  She was not having any non healing wounds or rest pain.  She was doing well from her carotid standpoint and no neurological sx.  Her bypass had decreased velocities but she did not want to undergo arteriogram and wanted to repeat the u/s in 6 months.  There was an incidental finding of small AAA on renal artery duplex as well. She was continuing to smoke with no interest in quitting.   The pt returns today for follow up.  She states that she does not have any claudication, rest pain or non healing wounds.  She does have some left ankle swelling and pain in the ankle.  She does not have any new abdominal or back pain.  She states that her blood pressure is not normally high.  She is compliant with here asa/statin/plavix. She continues to smoke.    The pt is on a statin for cholesterol management.    The pt is on an aspirin.    Other AC:  Plavix The pt is on CCB, ARB for hypertension.  The pt is is on medication for diabetes. Tobacco hx:  current  Pt does not have family hx of AAA.  Past Medical History:   Diagnosis Date   Carotid artery occlusion    Diabetes mellitus    borderline , no longer treated /w medicine    Hyperlipidemia    Hypertension    Peripheral vascular disease (HCC)    emboli of left foot from left superficial femoral and popliteal occulsive disease   Renal artery stenosis (HCC)    s/p left renal artery stent 06/19/19   Stroke (HCC) 01/04/2006    Past Surgical History:  Procedure Laterality Date   ABDOMINAL AORTAGRAM N/A 10/03/2012   Procedure: ABDOMINAL Ronny Flurry;  Surgeon: Nada Libman, MD;  Location: [facility] CATH LAB;  Service: Cardiovascular;  Laterality: N/A;   ABDOMINAL AORTOGRAM N/A 06/19/2019   Procedure: ABDOMINAL AORTOGRAM;  Surgeon: Nada Libman, MD;  Location: [facility] INVASIVE CV LAB;  Service: Cardiovascular;  Laterality: N/A;   ABDOMINAL AORTOGRAM W/LOWER EXTREMITY Bilateral 05/29/2019   Procedure: ABDOMINAL AORTOGRAM W/LOWER EXTREMITY;  Surgeon: Nada Libman, MD;  Location: [facility] INVASIVE CV LAB;  Service: Cardiovascular;  Laterality: Bilateral;   FEMORAL-POPLITEAL BYPASS GRAFT Left 10/12/2012   Procedure: BYPASS GRAFT FEMORAL-POPLITEAL ARTERY;  Surgeon: Pryor Ochoa, MD;  Location: [facility];  Service: Vascular;  Laterality: Left;   INGUINAL HERNIA REPAIR     right   left popliteal stent   04/30/2009   LOWER EXTREMITY ANGIOGRAM Bilateral 10/03/2012   Procedure: LOWER EXTREMITY ANGIOGRAM;  Surgeon: Nada Libman, MD;  Location: [facility] CATH LAB;  Service: Cardiovascular;  Laterality: Bilateral;   PERIPHERAL VASCULAR ATHERECTOMY Right 05/29/2019   Procedure: PERIPHERAL VASCULAR ATHERECTOMY;  Surgeon: Nada Libman, MD;  Location: [facility] INVASIVE CV LAB;  Service: Cardiovascular;  Laterality: Right;  laser  SFA   PERIPHERAL VASCULAR BALLOON ANGIOPLASTY Right 05/29/2019   Procedure: PERIPHERAL VASCULAR BALLOON ANGIOPLASTY;  Surgeon: Nada Libman, MD;  Location: [facility] INVASIVE CV LAB;  Service: Cardiovascular;  Laterality: Right;  SFA   PERIPHERAL VASCULAR  INTERVENTION Left 06/19/2019   Procedure: PERIPHERAL VASCULAR INTERVENTION;  Surgeon: Nada Libman, MD;  Location: [facility] INVASIVE CV LAB;  Service: Cardiovascular;  Laterality: Left;  RENAL   right SFA stent   01/05/2008   SFA STENT  04/04/2005   LEFT   TRANSCAROTID ARTERY REVASCULARIZATION  Left 02/27/2021   Procedure: Left Transcarotid Artery Revascularization;  Surgeon: Nada Libman, MD;  Location: [facility] OR;  Service: Vascular;  Laterality: Left;   TUBAL LIGATION     VASCULAR SURGERY      No Known Allergies  Current Outpatient Medications  Medication Sig Dispense Refill   amLODipine (NORVASC) 5 MG tablet Take 5 mg by mouth in the morning.     aspirin 81 MG EC tablet Take 81 mg by mouth every evening.     Cholecalciferol (VITAMIN D3) 50 MCG (2000 UT) TABS Take 2,000 Units by mouth 4 (four) times a week.     clopidogrel (PLAVIX) 75 MG tablet Take 75 mg by mouth every evening.     losartan (COZAAR) 50 MG tablet Take 100 mg by mouth every evening.     metFORMIN (GLUCOPHAGE-XR) 500 MG 24 hr tablet Take 500 mg by mouth every evening.     oxyCODONE-acetaminophen (PERCOCET/ROXICET) 5-325 MG tablet Take 1 tablet by mouth every 6 (six) hours as needed for moderate pain. 12 tablet 0   Propylene Glycol (SYSTANE COMPLETE OP) Place 1 drop into the left eye 3 (three) times daily as needed (dry/irritated eyes).     simvastatin (ZOCOR) 80 MG tablet Take 80 mg by mouth at bedtime.     vitamin B-12 (CYANOCOBALAMIN) 1000 MCG tablet Take 1,000 mcg by mouth in the morning.     No current facility-administered medications for this visit.    Family History  Problem Relation Age of Onset   Coronary artery disease Mother    Heart disease Mother        After age 61   Heart attack Mother    Coronary artery disease Sister    Heart disease Sister        Before  age 46 -  Heart Transplant   Cancer Sister        Kidney   Heart attack Sister    Heart disease Father        After age 31   Other Father         amputation    Social History   Socioeconomic History   Marital status: Married    Spouse name: Not on file   Number of children: Not on file   Years of education: Not on file   Highest education level: Not on file  Occupational History   Not on file  Tobacco Use   Smoking status: Every Day    Current packs/day: 0.50    Average packs/day: 0.5 packs/day for 50.0 years (25.0 ttl pk-yrs)    Types: Cigarettes    Passive exposure: Current (Daughter Is a smoker)  Smokeless tobacco: Never  Vaping Use   Vaping status: Never Used  Substance and Sexual Activity   Alcohol use: No   Drug use: No   Sexual activity: Not on file  Other Topics Concern   Not on file  Social History Narrative   Not on file   Social Determinants of Health   Financial Resource Strain: Not on file  Food Insecurity: Not on file  Transportation Needs: Not on file  Physical Activity: Not on file  Stress: Not on file  Social Connections: Not on file  Intimate Partner Violence: Not on file     REVIEW OF SYSTEMS:   [X]  denotes positive finding, [ ]  denotes negative finding Cardiac  Comments:  Chest pain or chest pressure:    Shortness of breath upon exertion:    Short of breath when lying flat:    Irregular heart rhythm:        Vascular    Pain in calf, thigh, or hip brought on by ambulation:    Pain in feet at night that wakes you up from your sleep:     Blood clot in your veins:    Leg swelling:         Pulmonary    Oxygen at home:    Productive cough:     Wheezing:         Neurologic    Sudden weakness in arms or legs:     Sudden numbness in arms or legs:     Sudden onset of difficulty speaking or slurred speech:    Temporary loss of vision in one eye:     Problems with dizziness:         Gastrointestinal    Blood in stool:     Vomited blood:         Genitourinary    Burning when urinating:     Blood in urine:        Psychiatric    Major depression:         Hematologic     Bleeding problems:    Problems with blood clotting too easily:        Skin    Rashes or ulcers:        Constitutional    Fever or chills:      PHYSICAL EXAMINATION:  Today's Vitals   08/02/22 1002  BP: (!) 167/74  Pulse: 60  Resp: 18  Temp: (!) 97.3 F (36.3 C)  TempSrc: Temporal  SpO2: 99%  Weight: 142 lb 4.8 oz (64.5 kg)  Height: 5\' 2"  (1.575 m)   Body mass index is 26.03 kg/m.   General:  WDWN in NAD; vital signs documented above Gait: Not observed HENT: WNL, normocephalic Pulmonary: normal non-labored breathing , without wheezing Cardiac: regular HR, without carotid bruits Abdomen: soft, NT; aortic pulse is palpable Skin: without rashes Vascular Exam/Pulses:  Right Left  Radial 2+ (normal) 2+ (normal)  Femoral 2+ (normal) 2+ (normal)  Popliteal Unable to palpate Unable to palpate  DP monophasic monophasic  PT monophasic monophasic  Peroneal monophasic Faint monophasic   Extremities: without ischemic changes, without Gangrene , without cellulitis; without open wounds Musculoskeletal: no muscle wasting or atrophy  Neurologic: A&O X 3 Psychiatric:  The pt has Normal affect.   Non-Invasive Vascular Imaging:   ABI's/TBI's on 08/02/2022: Right:  0.56/0.46 - Great toe pressure: 78 Left:  0.63/0.38 - Great toe pressure: 65  Arterial duplex on 08/02/2022: Left Graft #1: Femoral-below knee popliteal  +--------------------+--------+--------+----------+--------+  PSV cm/sStenosisWaveform  Comments  +--------------------+--------+--------+----------+--------+  Inflow             45              monophasic          +--------------------+--------+--------+----------+--------+  Proximal Anastomosis29              monophasic          +--------------------+--------+--------+----------+--------+  Proximal Graft      24              monophasic          +--------------------+--------+--------+----------+--------+  Mid Graft            21              monophasic          +--------------------+--------+--------+----------+--------+  Distal Graft        29              monophasic          +--------------------+--------+--------+----------+--------+  Distal Anastomosis  32              monophasic          +--------------------+--------+--------+----------+--------+  Outflow            48              monophasic          +--------------------+--------+--------+----------+--------+   Summary:  Left: Patent left femoral-below knee popliteal bypass graft with low  velocities, suggestive of possible threatened graft. When compared to  prior study 02/01/22, velocities are diffusely lower throughout the graft  on today's exam. Area of dilatation of the   vein graft at the proximal end, just distal to the proximal anastomosis,  measuring 1.1 x 1.1cm   AAA duplex 08/02/2022: Abdominal Aorta Findings:  +-----------+-------+----------+----------+----------+--------+--------+  Location  AP (cm)Trans (cm)PSV (cm/s)Waveform  ThrombusComments  +-----------+-------+----------+----------+----------+--------+--------+  Proximal  3.10   3.80      55                          saccular  +-----------+-------+----------+----------+----------+--------+--------+  Mid       2.42   2.56      41                                    +-----------+-------+----------+----------+----------+--------+--------+  Distal    1.93   2.45      31                                    +-----------+-------+----------+----------+----------+--------+--------+  RT CIA Prox1.7    1.6       128       monophasic                  +-----------+-------+----------+----------+----------+--------+--------+  LT CIA Prox1.6    1.2       23        monophasic                  +-----------+-------+----------+----------+----------+--------+--------+      Previous ABI's/TBI's on 02/01/2022: Right:   0.58/0.26 - Great toe pressure: 35 Left:  0.80/unable to insonate - Great toe pressure:  unable to insonate  Previous arterial duplex: Renal artery duplex 02/01/2022: Left: LRV flow present.  Unable to visualize left renal artery stent, however elevated velocities suggest >60% stenosis.    Incidental finding: Aneurysmal dilatation of the proximal aorta, largest  measurement= 3.40cm (transverse).   BLE arterial duplex 02/01/2022: Summary:  Right:  Patent right superficial femoral artery stent without evidence of hemodynamically significant stenosis.   50-74% stenosis of the deep femoral artery.   Occluded mid posterior tibial artery with reconstitution distally via collateral flow.   Left:  Patent left femoral-popliteal bypass graft without evidence of hemodynamically significant stenosis.   75-99% stenosis of the deep femoral artery. Low velocities in the graft could suggest a threatened bypass.   Carotid duplex 02/01/2022: Right:  40-59% Left: patent without stenosis   ASSESSMENT/PLAN:: 82 y.o. female here for follow up for PAD with hx of  left femoropopliteal artery bypass graft using saphenous vein on 10/12/2012 by Dr. Myra Gianotti, left superficial femoral artery stent 03/11/2005, left popliteal artery stent 04/30/2009, and right superficial femoral artery stent on 10/17/2009.  she subsequently underwent angiogram with laser atherectomy of right SFA and drug coated balloon angioplasty of the right SFA on 05/29/2019 by Dr. Cory Roughen.  On 06/19/2019, she underwent left renal artery stent by Dr. Myra Gianotti.  She has hx of left TCAR on 02/27/2021 by Dr. Myra Gianotti for asymptomatic carotid artery stenosis.   PAD: -LLE duplex today reveals decreased velocities and are lower than they were 6 months ago.  Discussed scheduling angiogram to evaluate as bypass is threatened for failure given decreased velocities.  She has palpable femoral pulses bilaterally.  Her ABI has decreased on the left and ABI on the  right is essentially unchanged but TBI improved.  This will be scheduled with Dr. Myra Gianotti.  -BLE arterial duplex and ABI also ordered for January 2025.   Carotid artery stenosis: -pt will have carotid duplex in January 2025.  The right ICA stenosis was 40-59% in January 2024. She remains asymptomatic  Incidental finding of AAA on renal artery duplex 02/01/2022 -duplex today reveals that AAA has increased in size at 3.8cm from 3.4cm 6 months ago and saccular in nature.   -Discussed that if she develops severe sudden onset of abdominal or back pain, to call 911 as this could be indication of rupture.  -follow up in 6 months with AAA duplex.  If increased in size at that time, may need CTA to evaluate given saccular nature.    Renal artery stenosis -BP today is elevated but she states that it does not run that high at home.  -will check renal artery duplex in January 2025.   Current smoker: -discussed importance of smoking cessation.   -she does not appear interested in quitting.  -continue asa/statin/plavix     Doreatha Massed, [facility] [phone]  Clinic MD:   Lenell Antu

## 2022-08-02 ENCOUNTER — Other Ambulatory Visit: Payer: Self-pay | Admitting: Surgery

## 2022-08-02 ENCOUNTER — Ambulatory Visit (INDEPENDENT_AMBULATORY_CARE_PROVIDER_SITE_OTHER)
Admission: RE | Admit: 2022-08-02 | Discharge: 2022-08-02 | Disposition: A | Discharge status: Home / Self Care | Payer: Medicare Other | Source: Ambulatory Visit | Attending: Surgery | Admitting: Surgery

## 2022-08-02 ENCOUNTER — Ambulatory Visit (INDEPENDENT_AMBULATORY_CARE_PROVIDER_SITE_OTHER): Payer: Medicare Other | Admitting: Physician Assistant

## 2022-08-02 ENCOUNTER — Ambulatory Visit (HOSPITAL_COMMUNITY)
Admission: RE | Admit: 2022-08-02 | Discharge: 2022-08-02 | Disposition: A | Discharge status: Home / Self Care | Payer: Medicare Other | Source: Ambulatory Visit | Attending: Surgery | Admitting: Surgery

## 2022-08-02 VITALS — BP 167/74 | HR 60 | Temp 97.3°F | Resp 18 | Ht 62.0 in | Wt 142.3 lb

## 2022-08-02 DIAGNOSIS — I714 Abdominal aortic aneurysm, without rupture, unspecified: Secondary | ICD-10-CM

## 2022-08-02 DIAGNOSIS — F17200 Nicotine dependence, unspecified, uncomplicated: Secondary | ICD-10-CM | POA: Diagnosis not present

## 2022-08-02 DIAGNOSIS — I70213 Atherosclerosis of native arteries of extremities with intermittent claudication, bilateral legs: Secondary | ICD-10-CM | POA: Insufficient documentation

## 2022-08-02 DIAGNOSIS — I779 Disorder of arteries and arterioles, unspecified: Secondary | ICD-10-CM | POA: Diagnosis not present

## 2022-08-02 LAB — VAS US ABI WITH/WO TBI
Left ABI: 0.63
Right ABI: 0.56

## 2022-08-04 ENCOUNTER — Other Ambulatory Visit: Payer: Self-pay | Admitting: *Deleted

## 2022-08-04 DIAGNOSIS — I779 Disorder of arteries and arterioles, unspecified: Secondary | ICD-10-CM

## 2022-08-17 ENCOUNTER — Encounter (HOSPITAL_COMMUNITY)
Admission: RE | Disposition: A | Discharge status: Home / Self Care | Payer: Self-pay | Source: Home / Self Care | Attending: Surgery

## 2022-08-17 ENCOUNTER — Ambulatory Visit (HOSPITAL_COMMUNITY)
Admission: RE | Admit: 2022-08-17 | Discharge: 2022-08-17 | Disposition: A | Discharge status: Home / Self Care | Payer: Medicare Other | Attending: Surgery | Admitting: Surgery

## 2022-08-17 ENCOUNTER — Other Ambulatory Visit: Payer: Self-pay

## 2022-08-17 ENCOUNTER — Telehealth: Payer: Self-pay | Admitting: Surgery

## 2022-08-17 DIAGNOSIS — T82858A Stenosis of vascular prosthetic devices, implants and grafts, initial encounter: Secondary | ICD-10-CM | POA: Diagnosis not present

## 2022-08-17 DIAGNOSIS — I6521 Occlusion and stenosis of right carotid artery: Secondary | ICD-10-CM | POA: Insufficient documentation

## 2022-08-17 DIAGNOSIS — I714 Abdominal aortic aneurysm, without rupture, unspecified: Secondary | ICD-10-CM | POA: Insufficient documentation

## 2022-08-17 DIAGNOSIS — E1151 Type 2 diabetes mellitus with diabetic peripheral angiopathy without gangrene: Secondary | ICD-10-CM | POA: Diagnosis not present

## 2022-08-17 DIAGNOSIS — Z7984 Long term (current) use of oral hypoglycemic drugs: Secondary | ICD-10-CM | POA: Diagnosis not present

## 2022-08-17 DIAGNOSIS — E785 Hyperlipidemia, unspecified: Secondary | ICD-10-CM | POA: Insufficient documentation

## 2022-08-17 DIAGNOSIS — Z8673 Personal history of transient ischemic attack (TIA), and cerebral infarction without residual deficits: Secondary | ICD-10-CM | POA: Insufficient documentation

## 2022-08-17 DIAGNOSIS — I701 Atherosclerosis of renal artery: Secondary | ICD-10-CM | POA: Diagnosis not present

## 2022-08-17 DIAGNOSIS — I779 Disorder of arteries and arterioles, unspecified: Secondary | ICD-10-CM

## 2022-08-17 DIAGNOSIS — Z7982 Long term (current) use of aspirin: Secondary | ICD-10-CM | POA: Diagnosis not present

## 2022-08-17 DIAGNOSIS — F17210 Nicotine dependence, cigarettes, uncomplicated: Secondary | ICD-10-CM | POA: Diagnosis not present

## 2022-08-17 DIAGNOSIS — Z8249 Family history of ischemic heart disease and other diseases of the circulatory system: Secondary | ICD-10-CM | POA: Diagnosis not present

## 2022-08-17 DIAGNOSIS — I119 Hypertensive heart disease without heart failure: Secondary | ICD-10-CM | POA: Diagnosis not present

## 2022-08-17 DIAGNOSIS — Y831 Surgical operation with implant of artificial internal device as the cause of abnormal reaction of the patient, or of later complication, without mention of misadventure at the time of the procedure: Secondary | ICD-10-CM | POA: Insufficient documentation

## 2022-08-17 DIAGNOSIS — Z7902 Long term (current) use of antithrombotics/antiplatelets: Secondary | ICD-10-CM | POA: Diagnosis not present

## 2022-08-17 HISTORY — PX: PERIPHERAL VASCULAR INTERVENTION: CATH118257

## 2022-08-17 HISTORY — PX: ABDOMINAL AORTOGRAM W/LOWER EXTREMITY: CATH118223

## 2022-08-17 LAB — POCT I-STAT, CHEM 8
BUN: 23 mg/dL (ref 8–23)
BUN: 33 mg/dL — ABNORMAL HIGH (ref 8–23)
Calcium, Ion: 1.21 mmol/L (ref 1.15–1.40)
Calcium, Ion: 1.27 mmol/L (ref 1.15–1.40)
Chloride: 107 mmol/L (ref 98–111)
Chloride: 107 mmol/L (ref 98–111)
Creatinine, Ser: 1.3 mg/dL — ABNORMAL HIGH (ref 0.44–1.00)
Creatinine, Ser: 1.4 mg/dL — ABNORMAL HIGH (ref 0.44–1.00)
Glucose, Bld: 103 mg/dL — ABNORMAL HIGH (ref 70–99)
Glucose, Bld: 109 mg/dL — ABNORMAL HIGH (ref 70–99)
HCT: 42 % (ref 36.0–46.0)
HCT: 44 % (ref 36.0–46.0)
Hemoglobin: 14.3 g/dL (ref 12.0–15.0)
Hemoglobin: 15 g/dL (ref 12.0–15.0)
Potassium: 4.6 mmol/L (ref 3.5–5.1)
Potassium: 5.8 mmol/L — ABNORMAL HIGH (ref 3.5–5.1)
Sodium: 141 mmol/L (ref 135–145)
Sodium: 142 mmol/L (ref 135–145)
TCO2: 26 mmol/L (ref 22–32)
TCO2: 28 mmol/L (ref 22–32)

## 2022-08-17 SURGERY — ABDOMINAL AORTOGRAM W/LOWER EXTREMITY
Anesthesia: LOCAL | Laterality: Left

## 2022-08-17 MED ORDER — MIDAZOLAM HCL 2 MG/2ML IJ SOLN
INTRAMUSCULAR | Status: AC
  Filled 2022-08-17: qty 2

## 2022-08-17 MED ORDER — FENTANYL CITRATE (PF) 100 MCG/2ML IJ SOLN
INTRAMUSCULAR | Status: DC | PRN
  Administered 2022-08-17: 50 ug via INTRAVENOUS

## 2022-08-17 MED ORDER — ACETAMINOPHEN 325 MG PO TABS: 650.0000 mg | ORAL_TABLET | ORAL | Status: DC | PRN

## 2022-08-17 MED ORDER — HEPARIN (PORCINE) IN NACL 1000-0.9 UT/500ML-% IV SOLN
INTRAVENOUS | Status: DC | PRN
  Administered 2022-08-17 (×2): 500 mL

## 2022-08-17 MED ORDER — FENTANYL CITRATE (PF) 100 MCG/2ML IJ SOLN
INTRAMUSCULAR | Status: AC
  Filled 2022-08-17: qty 2

## 2022-08-17 MED ORDER — OXYCODONE HCL 5 MG PO TABS: 5.0000 mg | ORAL_TABLET | ORAL | Status: DC | PRN

## 2022-08-17 MED ORDER — SODIUM CHLORIDE 0.9% FLUSH: 3.0000 mL | INTRAVENOUS | Status: DC | PRN

## 2022-08-17 MED ORDER — IODIXANOL 320 MG/ML IV SOLN
INTRAVENOUS | Status: DC | PRN
  Administered 2022-08-17: 98 mL

## 2022-08-17 MED ORDER — LIDOCAINE HCL (PF) 1 % IJ SOLN
INTRAMUSCULAR | Status: DC | PRN
  Administered 2022-08-17: 15 mL

## 2022-08-17 MED ORDER — SODIUM CHLORIDE 0.9 % IV SOLN: 250.0000 mL | INTRAVENOUS | Status: DC | PRN

## 2022-08-17 MED ORDER — LABETALOL HCL 5 MG/ML IV SOLN: 10.0000 mg | INTRAVENOUS | Status: DC | PRN

## 2022-08-17 MED ORDER — SODIUM CHLORIDE 0.9 % IV SOLN: INTRAVENOUS | Status: DC

## 2022-08-17 MED ORDER — LIDOCAINE HCL (PF) 1 % IJ SOLN
INTRAMUSCULAR | Status: AC
  Filled 2022-08-17: qty 30

## 2022-08-17 MED ORDER — SODIUM CHLORIDE 0.9% FLUSH: 3.0000 mL | Freq: Two times a day (BID) | INTRAVENOUS | Status: DC

## 2022-08-17 MED ORDER — ONDANSETRON HCL 4 MG/2ML IJ SOLN: 4.0000 mg | Freq: Four times a day (QID) | INTRAMUSCULAR | Status: DC | PRN

## 2022-08-17 MED ORDER — HYDRALAZINE HCL 20 MG/ML IJ SOLN: 5.0000 mg | INTRAMUSCULAR | Status: DC | PRN

## 2022-08-17 MED ORDER — HEPARIN SODIUM (PORCINE) 1000 UNIT/ML IJ SOLN
INTRAMUSCULAR | Status: AC
  Filled 2022-08-17: qty 10

## 2022-08-17 MED ORDER — SODIUM CHLORIDE 0.9 % WEIGHT BASED INFUSION: 1.0000 mL/kg/h | INTRAVENOUS | Status: DC

## 2022-08-17 MED ORDER — MIDAZOLAM HCL 2 MG/2ML IJ SOLN
INTRAMUSCULAR | Status: DC | PRN
  Administered 2022-08-17: 2 mg via INTRAVENOUS

## 2022-08-17 MED ORDER — MORPHINE SULFATE (PF) 2 MG/ML IV SOLN: 2.0000 mg | INTRAVENOUS | Status: DC | PRN

## 2022-08-17 SURGICAL SUPPLY — 22 items
BALLN MUSTANG 6.0X40 75 (BALLOONS) ×2
BALLOON MUSTANG 6.0X40 75 (BALLOONS) IMPLANT
CATH ANGIO 5F BER2 65CM (CATHETERS) IMPLANT
CATH NAVICROSS ANG 65CM (CATHETERS) IMPLANT
CATH OMNI FLUSH 5F 65CM (CATHETERS) IMPLANT
CATHETER NAVICROSS ANG 65CM (CATHETERS) ×2
DEVICE TORQUE SEADRAGON GRN (MISCELLANEOUS) IMPLANT
DEVICE VASC CLSR CELT ART 6 (Vascular Products) IMPLANT
GUIDEWIRE ANGLED .035X150CM (WIRE) IMPLANT
KIT ENCORE 26 ADVANTAGE (KITS) IMPLANT
KIT MICROPUNCTURE NIT STIFF (SHEATH) IMPLANT
PROTECTION STATION PRESSURIZED (MISCELLANEOUS) ×2
SET ATX-X65L (MISCELLANEOUS) IMPLANT
SHEATH CATAPULT 6F 45 MP (SHEATH) IMPLANT
SHEATH PINNACLE 5F 10CM (SHEATH) IMPLANT
SHEATH PINNACLE 6F 10CM (SHEATH) IMPLANT
SHEATH PROBE COVER 6X72 (BAG) IMPLANT
STATION PROTECTION PRESSURIZED (MISCELLANEOUS) IMPLANT
STENT ELUVIA 7X40X130 (Permanent Stent) IMPLANT
TRAY PV CATH (CUSTOM PROCEDURE TRAY) IMPLANT
WIRE BENTSON .035X145CM (WIRE) IMPLANT
WIRE HI TORQ VERSACORE 300 (WIRE) IMPLANT

## 2022-08-17 NOTE — Telephone Encounter (Signed)
-----   Message from [name] sent at 08/17/2022 11:33 AM EDT ----- 08/17/2022:   Surgeon:  [name] Procedure Performed:  1.  Ultrasound-guided access, right femoral artery  2.  Abdominal aortogram  3.  Catheter selection, left external iliac artery  4.  Bilateral leg angiogram  5.  Stent, left external iliac artery (Eluvia 7 x 40)  6.  Conscious sedation 57 minutes  7.  Closure device, Celt   Follow-up in [facility] clinic in 3 months with ABIs and left leg arterial duplex

## 2022-08-17 NOTE — Op Note (Signed)
Patient name: [name] MRN: [id number] DOB: October 11, 1940 Sex: female  08/17/2022 Pre-operative Diagnosis: Left fem pop bypass graft stenosis Post-operative diagnosis:  Same Surgeon:  [name] Procedure Performed:  1.  Ultrasound-guided access, right femoral artery  2.  Abdominal aortogram  3.  Catheter selection, left external iliac artery  4.  Bilateral leg angiogram  5.  Stent, left external iliac artery (Eluvia 7 x 40)  6.  Conscious sedation 57 minutes  7.  Closure device, Celt   Indications: This is an 82 year old female who has undergone multiple vascular interventions.  She has a left femoral to below-knee popliteal bypass graft with vein which has had progressively lowering velocities, the velocities are at a point now we are concerned about bypass graft failure and so she comes in today for angiography.  Procedure:  The patient was identified in the holding area and taken to room 8.  The patient was then placed supine on the table and prepped and draped in the usual sterile fashion.  A time out was called.  Conscious sedation was administered with the use of IV fentanyl and Versed under continuous physician and nurse monitoring.  Heart rate, blood pressure, and oxygen saturation were continuously monitored.  Total sedation time was 57 minutes.  Ultrasound was used to evaluate the right common femoral artery.  It was patent .  A digital ultrasound image was acquired.  A micropuncture needle was used to access the right common femoral artery under ultrasound guidance.  An 018 wire was advanced without resistance and a micropuncture sheath was placed.  The 018 wire was removed and a benson wire was placed.  The micropuncture sheath was exchanged for a 5 french sheath.  An omniflush catheter was advanced over the wire to the level of L-1.  An abdominal angiogram was obtained.  Next, using the omniflush catheter and a benson wire, the aortic bifurcation was crossed and the catheter  was placed into theleft external iliac artery and left runoff was obtained.  right runoff was performed via retrograde sheath injections.  Findings:   Aortogram: Left renal artery stent is widely patent.  The right renal artery was poorly visualized.  The infrarenal abdominal aorta is ectatic without significant stenosis.  There are lesions within bilateral common iliac arteries that are approximately 50%.  Diffuse disease throughout the right external iliac artery.  The left external iliac artery has a relatively short segment greater than 70% stenosis  Right Lower Extremity: The right common femoral artery is patent without significant stenosis.  The profundofemoral artery is also patent.  The superficial femoral artery has stents within the midportion that are widely patent without stenosis however the above-knee popliteal artery has a greater than 80% stenosis however.  Popliteal artery below the knee is occluded.  There is reconstitution of the peroneal artery which is the single-vessel runoff that does reconstitute the posterior tibial at the ankle  Left Lower Extremity: Left common femoral and profundofemoral artery are patent.  The femoral to below-knee popliteal artery bypass graft is widely patent.  Both anastomoses are without stenosis.  Distal anastomosis is to a very small popliteal artery.  There is single-vessel runoff via the peroneal artery  Intervention: After the above images were acquired the decision was made to proceed with intervention.  A 6 x 45 cm sheath was advanced over the bifurcation into the left common iliac artery.  The patient was fully heparinized I elected to primarily stent the external iliac stenosis.  A 7 x 40 Eluvia balloon expandable stent was deployed across the stenosis and postdilated with a 6 mm balloon.  Completion imaging showed resolution of the stenosis.  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  Intervention would only be recommended for limb salvage situations     V. [name], M.D., [facility] [place] Office: [phone] Pager:  [phone]

## 2022-08-17 NOTE — Interval H&P Note (Signed)
History and Physical Interval Note:  08/17/2022 7:04 AM  [name]  has presented today for surgery, with the diagnosis of pad.  The various methods of treatment have been discussed with the patient and family. After consideration of risks, benefits and other options for treatment, the patient has consented to  Procedure(s): ABDOMINAL AORTOGRAM W/LOWER EXTREMITY (N/A) as a surgical intervention.  The patient's history has been reviewed, patient examined, no change in status, stable for surgery.  I have reviewed the patient's chart and labs.  Questions were answered to the patient's satisfaction.     [name]

## 2022-08-17 NOTE — Telephone Encounter (Signed)
Pt currently still in hospital. 

## 2022-08-17 NOTE — Progress Notes (Signed)
Patient and daughter was given discharge instructions. Both verbalized understanding. 

## 2022-08-18 ENCOUNTER — Encounter (HOSPITAL_COMMUNITY): Payer: Self-pay | Admitting: Surgery

## 2022-11-12 ENCOUNTER — Other Ambulatory Visit: Payer: Self-pay

## 2022-11-12 DIAGNOSIS — I779 Disorder of arteries and arterioles, unspecified: Secondary | ICD-10-CM

## 2022-11-29 ENCOUNTER — Ambulatory Visit (INDEPENDENT_AMBULATORY_CARE_PROVIDER_SITE_OTHER)
Admission: RE | Admit: 2022-11-29 | Discharge: 2022-11-29 | Disposition: A | Discharge status: Home / Self Care | Payer: Medicare Other | Source: Ambulatory Visit | Attending: Surgery | Admitting: Surgery

## 2022-11-29 ENCOUNTER — Ambulatory Visit (HOSPITAL_COMMUNITY)
Admission: RE | Admit: 2022-11-29 | Discharge: 2022-11-29 | Disposition: A | Discharge status: Home / Self Care | Payer: Medicare Other | Source: Ambulatory Visit | Attending: Surgery | Admitting: Surgery

## 2022-11-29 ENCOUNTER — Ambulatory Visit (INDEPENDENT_AMBULATORY_CARE_PROVIDER_SITE_OTHER): Payer: Medicare Other | Admitting: Physician Assistant

## 2022-11-29 VITALS — BP 142/70 | HR 65 | Temp 97.9°F | Ht 62.0 in | Wt 140.6 lb

## 2022-11-29 DIAGNOSIS — I6523 Occlusion and stenosis of bilateral carotid arteries: Secondary | ICD-10-CM | POA: Diagnosis not present

## 2022-11-29 DIAGNOSIS — I779 Disorder of arteries and arterioles, unspecified: Secondary | ICD-10-CM

## 2022-11-29 LAB — VAS US ABI WITH/WO TBI
Left ABI: 0.86
Right ABI: 0.53

## 2022-11-29 NOTE — Progress Notes (Signed)
[facility] [place] HISTORY AND PHYSICAL   History of Present Illness:  Patient is a 82 y.o. year old female who presents for evaluation of  Of PAD.  She has a history of left femoropopliteal artery bypass graft using saphenous vein on 10/12/2012 by Dr. Myra Gianotti, left superficial femoral artery stent 03/11/2005, left popliteal artery stent 04/30/2009, and right superficial femoral artery stent on 10/17/2009.  she subsequently underwent angiogram with laser atherectomy of right SFA and drug coated balloon angioplasty of the right SFA on 05/29/2019 by Dr. Cory Roughen.   On 06/19/2019, she underwent left renal artery stent by Dr. Myra Gianotti.  She has hx of left TCAR on 02/27/2021 by Dr. Myra Gianotti for asymptomatic carotid artery stenosis.    She has history of chronic left ankle pain and edema.  This is not a new symptoms.  She states she can walk a couple of hundred feet before her ankle makes her rest.  She denies calve pain, non healing wounds or rest pain at night.  Her daughter is with her today.  She asked if we could get her a scooter for mobility.    I suggested she ask her PCP.    She is medically managed on ASA, Plavix and Statin daily.       Past Medical History:  Diagnosis Date   Carotid artery occlusion    Diabetes mellitus    borderline , no longer treated /w medicine    Hyperlipidemia    Hypertension    Peripheral arterial disease (HCC)    Peripheral vascular disease (HCC)    emboli of left foot from left superficial femoral and popliteal occulsive disease   Renal artery stenosis (HCC)    s/p left renal artery stent 06/19/19   Stroke (HCC) 01/04/2006    Past Surgical History:  Procedure Laterality Date   ABDOMINAL AORTAGRAM N/A 10/03/2012   Procedure: ABDOMINAL Ronny Flurry;  Surgeon: Nada Libman, MD;  Location: [facility] CATH LAB;  Service: Cardiovascular;  Laterality: N/A;   ABDOMINAL AORTOGRAM N/A 06/19/2019   Procedure: ABDOMINAL AORTOGRAM;  Surgeon: Nada Libman,  MD;  Location: [facility] INVASIVE CV LAB;  Service: Cardiovascular;  Laterality: N/A;   ABDOMINAL AORTOGRAM W/LOWER EXTREMITY Bilateral 05/29/2019   Procedure: ABDOMINAL AORTOGRAM W/LOWER EXTREMITY;  Surgeon: Nada Libman, MD;  Location: [facility] INVASIVE CV LAB;  Service: Cardiovascular;  Laterality: Bilateral;   ABDOMINAL AORTOGRAM W/LOWER EXTREMITY Bilateral 08/17/2022   Procedure: ABDOMINAL AORTOGRAM W/LOWER EXTREMITY;  Surgeon: Nada Libman, MD;  Location: [facility] INVASIVE CV LAB;  Service: Cardiovascular;  Laterality: Bilateral;   FEMORAL-POPLITEAL BYPASS GRAFT Left 10/12/2012   Procedure: BYPASS GRAFT FEMORAL-POPLITEAL ARTERY;  Surgeon: Pryor Ochoa, MD;  Location: [facility] OR;  Service: Vascular;  Laterality: Left;   INGUINAL HERNIA REPAIR     right   left popliteal stent   04/30/2009   LOWER EXTREMITY ANGIOGRAM Bilateral 10/03/2012   Procedure: LOWER EXTREMITY ANGIOGRAM;  Surgeon: Nada Libman, MD;  Location: [facility] CATH LAB;  Service: Cardiovascular;  Laterality: Bilateral;   PERIPHERAL VASCULAR ATHERECTOMY Right 05/29/2019   Procedure: PERIPHERAL VASCULAR ATHERECTOMY;  Surgeon: Nada Libman, MD;  Location: [facility] INVASIVE CV LAB;  Service: Cardiovascular;  Laterality: Right;  laser  SFA   PERIPHERAL VASCULAR BALLOON ANGIOPLASTY Right 05/29/2019   Procedure: PERIPHERAL VASCULAR BALLOON ANGIOPLASTY;  Surgeon: Nada Libman, MD;  Location: [facility] INVASIVE CV LAB;  Service: Cardiovascular;  Laterality: Right;  SFA   PERIPHERAL VASCULAR INTERVENTION Left 06/19/2019   Procedure: PERIPHERAL VASCULAR INTERVENTION;  Surgeon: Nada Libman, MD;  Location: [facility] INVASIVE CV LAB;  Service: Cardiovascular;  Laterality: Left;  RENAL   PERIPHERAL VASCULAR INTERVENTION Left 08/17/2022   Procedure: PERIPHERAL VASCULAR INTERVENTION;  Surgeon: Nada Libman, MD;  Location: [facility] INVASIVE CV LAB;  Service: Cardiovascular;  Laterality: Left;  Iliac   right SFA stent   01/05/2008   SFA STENT  04/04/2005   LEFT    TRANSCAROTID ARTERY REVASCULARIZATION  Left 02/27/2021   Procedure: Left Transcarotid Artery Revascularization;  Surgeon: Nada Libman, MD;  Location: [facility] OR;  Service: Vascular;  Laterality: Left;   TUBAL LIGATION     VASCULAR SURGERY      ROS:   General:  No weight loss, Fever, chills  HEENT: No recent headaches, no nasal bleeding, no visual changes, no sore throat  Neurologic: No dizziness, blackouts, seizures. No recent symptoms of stroke or mini- stroke. No recent episodes of slurred speech, or temporary blindness.  Cardiac: No recent episodes of chest pain/pressure, no shortness of breath at rest.  No shortness of breath with exertion.  Denies history of atrial fibrillation or irregular heartbeat  Vascular: No history of rest pain in feet.  No history of claudication.  No history of non-healing ulcer, No history of DVT   Pulmonary: No home oxygen, no productive cough, no hemoptysis,  No asthma or wheezing  Musculoskeletal:  [ ]  Arthritis, [ ]  Low back pain,  [ ]  Joint pain  Hematologic:No history of hypercoagulable state.  No history of easy bleeding.  No history of anemia  Gastrointestinal: No hematochezia or melena,  No gastroesophageal reflux, no trouble swallowing  Urinary: [ ]  chronic Kidney disease, [ ]  on HD - [ ]  MWF or [ ]  TTHS, [ ]  Burning with urination, [ ]  Frequent urination, [ ]  Difficulty urinating;   Skin: No rashes  Psychological: No history of anxiety,  No history of depression  Social History Social History   Tobacco Use   Smoking status: Every Day    Current packs/day: 0.50    Average packs/day: 0.5 packs/day for 50.0 years (25.0 ttl pk-yrs)    Types: Cigarettes    Passive exposure: Current (Daughter Is a smoker)   Smokeless tobacco: Never  Vaping Use   Vaping status: Never Used  Substance Use Topics   Alcohol use: No   Drug use: No    Family History Family History  Problem Relation Age of Onset   Coronary artery disease Mother     Heart disease Mother        After age 37   Heart attack Mother    Coronary artery disease Sister    Heart disease Sister        Before  age 80 -  Heart Transplant   Cancer Sister        Kidney   Heart attack Sister    Heart disease Father        After age 36   Other Father        amputation    Allergies  No Known Allergies   Current Outpatient Medications  Medication Sig Dispense Refill   acetaminophen (TYLENOL) 325 MG tablet Take 650 mg by mouth every 6 (six) hours as needed for moderate pain.     amLODipine (NORVASC) 5 MG tablet Take 5 mg by mouth in the morning.     Ascorbic Acid (VITAMIN C PO) Take 1 tablet by mouth daily.     aspirin 81 MG EC tablet Take 81  mg by mouth every evening.     clopidogrel (PLAVIX) 75 MG tablet Take 75 mg by mouth every evening.     losartan (COZAAR) 50 MG tablet Take 100 mg by mouth every evening.     metFORMIN (GLUCOPHAGE-XR) 500 MG 24 hr tablet Take 500 mg by mouth every evening.     Propylene Glycol (SYSTANE COMPLETE OP) Place 1 drop into the left eye in the morning.     simvastatin (ZOCOR) 80 MG tablet Take 80 mg by mouth at bedtime.     vitamin B-12 (CYANOCOBALAMIN) 1000 MCG tablet Take 1,000 mcg by mouth in the morning.     oxyCODONE-acetaminophen (PERCOCET/ROXICET) 5-325 MG tablet Take 1 tablet by mouth every 6 (six) hours as needed for moderate pain. (Patient not taking: Reported on 11/29/2022) 12 tablet 0   No current facility-administered medications for this visit.    Physical Examination  Vitals:   11/29/22 1027  BP: (!) 142/70  Pulse: 65  Temp: 97.9 F (36.6 C)  TempSrc: Temporal  SpO2: 94%  Weight: 140 lb 9.6 oz (63.8 kg)  Height: 5\' 2"  (1.575 m)    Body mass index is 25.72 kg/m.  General:  Alert and oriented, no acute distress HEENT: Normal Neck: No bruit or JVD Pulmonary: Clear to auscultation bilaterally Cardiac: Regular Rate and Rhythm without murmur Abdomen: Soft, non-tender, non-distended, no mass, no  scars Skin: No rash Extremity Pulses:  2+ radial, brachial, femoral, dorsalis pedis, posterior tibial pulses bilaterally Musculoskeletal: No deformity or edema  Neurologic: Upper and lower extremity motor 5/5 and symmetric  DATA:  Left Graft #1: Femoral-below knee popliteal  +--------------------+--------+---------------+----------+--------+                     PSV cm/sStenosis       Waveform  Comments  +--------------------+--------+---------------+----------+--------+  Inflow             88                     biphasic            +--------------------+--------+---------------+----------+--------+  Proximal Anastomosis87                     biphasic            +--------------------+--------+---------------+----------+--------+  Proximal Graft      47                     monophasic          +--------------------+--------+---------------+----------+--------+  Mid Graft           70                     monophasic          +--------------------+--------+---------------+----------+--------+  Distal Graft        80                     monophasic          +--------------------+--------+---------------+----------+--------+  Distal Anastomosis  170                    monophasic          +--------------------+--------+---------------+----------+--------+  Outflow            300     50-74% stenosismonophasic          +--------------------+--------+---------------+----------+--------+   Summary:  Left: Patent femoral-below knee popliteal bypas  graft with elevated  velocities in the outflow artery suggestive of 50-74% stenosis.   ABI Findings:  +---------+------------------+-----+----------+--------+  Right   Rt Pressure (mmHg)IndexWaveform  Comment   +---------+------------------+-----+----------+--------+  Brachial 143                                        +---------+------------------+-----+----------+--------+  PTA     69                 0.45 monophasic          +---------+------------------+-----+----------+--------+  DP      82                0.53 monophasic          +---------+------------------+-----+----------+--------+  Great Toe48                0.31                     +---------+------------------+-----+----------+--------+   +---------+------------------+-----+----------+-------+  Left    Lt Pressure (mmHg)IndexWaveform  Comment  +---------+------------------+-----+----------+-------+  Brachial 155                                       +---------+------------------+-----+----------+-------+  PTA     101               0.65 monophasic         +---------+------------------+-----+----------+-------+  DP      134               0.86 monophasic         +---------+------------------+-----+----------+-------+  Great Toe63                0.41                    +---------+------------------+-----+----------+-------+   +-------+-----------+-----------+------------+------------+  ABI/TBIToday's ABIToday's TBIPrevious ABIPrevious TBI  +-------+-----------+-----------+------------+------------+  Right 0.53       0.31       0.56        0.46          +-------+-----------+-----------+------------+------------+  Left  0.86       0.41       0.63        0.35          +-------+-----------+-----------+------------+------------+      Arterial wall calcification precludes accurate ankle pressures and ABIs.  Right ABIs appear essentially unchanged compared to prior study on  08/02/22. Left ABIs appear increased compared to prior study on 08/02/22.    Summary:  Right: Resting right ankle-brachial index indicates moderate right lower  extremity arterial disease. The right toe-brachial index is abnormal.   Left: Resting left ankle-brachial index indicates mild left lower  extremity arterial disease. The left toe-brachial index is abnormal.    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  The femoral to below-knee popliteal artery bypass graft is widely patent.  Both anastomoses are without stenosis.  Distal anastomosis is to a very small popliteal artery.  There is single-vessel runoff via the peroneal artery.    Right LE: The right common femoral artery is  patent without significant stenosis.  The profundofemoral artery is also patent.  The superficial femoral artery has stents within the midportion that are widely patent without stenosis however the above-knee popliteal artery has a greater than 80% stenosis however.  Popliteal artery below the knee is occluded.  There is reconstitution of the peroneal artery which is the single-vessel runoff that does reconstitute the posterior tibial at the ankle   Occlusion of right popliteal artery with peroneal reconstitution. Intervention would only be recommended for limb salvage situations      Her inflow is improved and the flow in the stent is improved.  The outflow shows increased stenosis with a PSV of 300 50-74 % stenosis.  Left ABIs appear increased compared to prior study on 08/02/22. Right ABIs appear essentially unchanged compared to prior study on  08/02/22.  Her symptoms have not changed her chief complaint is her left ankle pain and she has chronic mild edema.  She has a history of Left TCAR and has a f/u in 3 months for carotid follow up I will order repeat LE duplex and ABI's to follow the outflow stenosis.  She will try to increase her walking until then.  If she develops rest pain, wounds or claudication at short distances she will call sooner.       Mosetta Pigeon PA-C [facility] [place] Office: [phone]  MD on call Hetty Blend

## 2022-11-30 ENCOUNTER — Other Ambulatory Visit: Payer: Self-pay

## 2022-11-30 DIAGNOSIS — I779 Disorder of arteries and arterioles, unspecified: Secondary | ICD-10-CM

## 2022-11-30 DIAGNOSIS — I6523 Occlusion and stenosis of bilateral carotid arteries: Secondary | ICD-10-CM

## 2023-02-28 ENCOUNTER — Encounter: Payer: Self-pay | Admitting: Surgery

## 2023-02-28 ENCOUNTER — Ambulatory Visit (HOSPITAL_COMMUNITY)
Admission: RE | Admit: 2023-02-28 | Discharge: 2023-02-28 | Disposition: A | Discharge status: Home / Self Care | Payer: Medicare Other | Source: Ambulatory Visit | Attending: Surgery | Admitting: Surgery

## 2023-02-28 ENCOUNTER — Ambulatory Visit (INDEPENDENT_AMBULATORY_CARE_PROVIDER_SITE_OTHER): Payer: Medicare Other | Admitting: Surgery

## 2023-02-28 ENCOUNTER — Ambulatory Visit (INDEPENDENT_AMBULATORY_CARE_PROVIDER_SITE_OTHER)
Admission: RE | Admit: 2023-02-28 | Discharge: 2023-02-28 | Disposition: A | Discharge status: Home / Self Care | Payer: Medicare Other | Source: Ambulatory Visit | Attending: Surgery | Admitting: Surgery

## 2023-02-28 VITALS — BP 146/91 | HR 76 | Temp 98.1°F | Resp 20 | Ht 62.0 in | Wt 138.0 lb

## 2023-02-28 DIAGNOSIS — I6523 Occlusion and stenosis of bilateral carotid arteries: Secondary | ICD-10-CM

## 2023-02-28 DIAGNOSIS — I779 Disorder of arteries and arterioles, unspecified: Secondary | ICD-10-CM

## 2023-02-28 DIAGNOSIS — Z95828 Presence of other vascular implants and grafts: Secondary | ICD-10-CM | POA: Diagnosis not present

## 2023-02-28 LAB — VAS US ABI WITH/WO TBI
Left ABI: 0.81
Right ABI: 0.53

## 2023-02-28 NOTE — Progress Notes (Signed)
 [facility] [place]  Patient name: [name] MRN: [id number] DOB: Nov 04, 1940 Sex: female   REASON FOR VISIT:    Follow up  HISOTRY OF PRESENT ILLNESS:    [name] is a 83 y.o. female who has undergone the following vascular procedures: 03/11/2005: Left SFA angioplasty Hart [facility]) 10/03/2012: Diagnostic angiography for claudication 10/12/2012: Left femoral to below-knee popliteal artery bypass graft with saphenous vein [facility]) 05/29/2019: Laser atherectomy with DCB, right leg (Myeisha Kruser) 06/19/2019: Left renal artery stent (Durelle Zepeda) 02/27/2021:  Left-sided TCAR (: Left-sided TCAR )(asymptomatic (Kearra Calkin) 08/17/2022: Left external iliac stent (Nettie Wyffels)  She is back today for follow-up.  She takes a statin for hypercholesterolemia.  She is medically managed for hypertension.  She is a diabetic.  She has been having some left ankle issues that did improve after iliac stenting.  She denies any neurologic symptoms. PAST MEDICAL HISTORY:   Past Medical History:  Diagnosis Date   Carotid artery occlusion    Diabetes mellitus    borderline , no longer treated /w medicine    Hyperlipidemia    Hypertension    Peripheral arterial disease (HCC)    Peripheral vascular disease (HCC)    emboli of left foot from left superficial femoral and popliteal occulsive disease   Renal artery stenosis (HCC)    s/p left renal artery stent 06/19/19   Stroke (HCC) 01/04/2006     FAMILY HISTORY:   Family History  Problem Relation Age of Onset   Coronary artery disease Mother    Heart disease Mother        After age 49   Heart attack Mother    Coronary artery disease Sister    Heart disease Sister        Before  age 46 -  Heart Transplant   Cancer Sister        Kidney   Heart attack Sister    Heart disease Father        After age 76   Other Father        amputation    SOCIAL HISTORY:   Social History   Tobacco Use   Smoking  status: Every Day    Current packs/day: 0.50    Average packs/day: 0.5 packs/day for 50.0 years (25.0 ttl pk-yrs)    Types: Cigarettes    Passive exposure: Current (Daughter Is a smoker)   Smokeless tobacco: Never  Substance Use Topics   Alcohol use: No     ALLERGIES:   No Known Allergies   CURRENT MEDICATIONS:   Current Outpatient Medications  Medication Sig Dispense Refill   acetaminophen (TYLENOL) 325 MG tablet Take 650 mg by mouth every 6 (six) hours as needed for moderate pain.     amLODipine (NORVASC) 5 MG tablet Take 5 mg by mouth in the morning.     Ascorbic Acid (VITAMIN C PO) Take 1 tablet by mouth daily.     aspirin 81 MG EC tablet Take 81 mg by mouth every evening.     clopidogrel (PLAVIX) 75 MG tablet Take 75 mg by mouth every evening.     losartan (COZAAR) 50 MG tablet Take 100 mg by mouth every evening.     metFORMIN (GLUCOPHAGE-XR) 500 MG 24 hr tablet Take 500 mg by mouth every evening.     oxyCODONE-acetaminophen (PERCOCET/ROXICET) 5-325 MG tablet Take 1 tablet by mouth every 6 (six) hours as needed for moderate pain. 12 tablet 0   Propylene Glycol (SYSTANE COMPLETE OP) Place  1 drop into the left eye in the morning.     simvastatin (ZOCOR) 80 MG tablet Take 80 mg by mouth at bedtime.     vitamin B-12 (CYANOCOBALAMIN) 1000 MCG tablet Take 1,000 mcg by mouth in the morning.     No current facility-administered medications for this visit.    REVIEW OF SYSTEMS:   [X]  denotes positive finding, [ ]  denotes negative finding Cardiac  Comments:  Chest pain or chest pressure:    Shortness of breath upon exertion:    Short of breath when lying flat:    Irregular heart rhythm:        Vascular    Pain in calf, thigh, or hip brought on by ambulation:    Pain in feet at night that wakes you up from your sleep:     Blood clot in your veins:    Leg swelling:         Pulmonary    Oxygen at home:    Productive cough:     Wheezing:         Neurologic    Sudden  weakness in arms or legs:     Sudden numbness in arms or legs:     Sudden onset of difficulty speaking or slurred speech:    Temporary loss of vision in one eye:     Problems with dizziness:         Gastrointestinal    Blood in stool:     Vomited blood:         Genitourinary    Burning when urinating:     Blood in urine:        Psychiatric    Major depression:         Hematologic    Bleeding problems:    Problems with blood clotting too easily:        Skin    Rashes or ulcers:        Constitutional    Fever or chills:      PHYSICAL EXAM:   Vitals:   02/28/23 1525 02/28/23 1528  BP: (!) 159/76 (!) 146/91  Pulse: 76   Resp: 20   Temp: 98.1 F (36.7 C)   SpO2: 93%   Weight: 138 lb (62.6 kg)   Height: 5\' 2"  (1.575 m)     GENERAL: The patient is a well-nourished female, in no acute distress. The vital signs are documented above. CARDIAC: There is a regular rate and rhythm.  PULMONARY: Non-labored respirations MUSCULOSKELETAL: There are no major deformities or cyanosis. NEUROLOGIC: No focal weakness or paresthesias are detected. SKIN: There are no ulcers or rashes noted. PSYCHIATRIC: The patient has a normal affect.  STUDIES:   I have reviewed the following: +-------+-----------+-----------+------------+------------+  ABI/TBIToday's ABIToday's TBIPrevious ABIPrevious TBI  +-------+-----------+-----------+------------+------------+  Right 0.53       0.38       0.53        0.31          +-------+-----------+-----------+------------+------------+  Left  0.81       0.38       0.86        0.41          +-----  Left: Patent femoral-below knee popliteal bypas graft with elevated  velocities in the outflow artery suggestive of 50-74% stenosis.      Right Carotid: Velocities in the right ICA are consistent with a 1-39%  stenosis.   Left Carotid: The ECA appears >50% stenosed. Patent left  carotid artery  stent               with no evidence of  hemodynamically significant stenosis.   Vertebrals: Bilateral vertebral arteries demonstrate antegrade flow.   MEDICAL ISSUES:   Renal: Stent was not imaged today, I will have this looked at in 6 months  Carotid: TCAR is widely patent.  She is asymptomatic.  She will need surveillance imaging in 1 year  Lower extremity PAD: ABIs remained stable.  There is concern of a distal anastomotic bypass graft stenosis.  Velocities are 300 cm/s.  She is asymptomatic and so I will continue to monitor this.  I will bring her back in 6 months with repeat ultrasound.  If the velocity profile has increased she will need repeat imaging.  I did look at this from her recent angiogram in August 2024 and it looks like there may be a valve at that level.  I will reevaluate this again in 6 months and plan angiography if her velocity profile has increased.    Charlena Cross, MD, FACS [facility] [phone] Pager [phone]

## 2023-03-03 ENCOUNTER — Other Ambulatory Visit: Payer: Self-pay | Admitting: *Deleted

## 2023-03-03 DIAGNOSIS — Z95828 Presence of other vascular implants and grafts: Secondary | ICD-10-CM

## 2023-03-03 DIAGNOSIS — I714 Abdominal aortic aneurysm, without rupture, unspecified: Secondary | ICD-10-CM

## 2023-09-12 ENCOUNTER — Encounter: Payer: Self-pay | Admitting: Surgery

## 2023-09-12 ENCOUNTER — Ambulatory Visit (HOSPITAL_COMMUNITY)
Admission: RE | Admit: 2023-09-12 | Discharge: 2023-09-12 | Disposition: A | Discharge status: Home / Self Care | Payer: Medicare Other | Source: Ambulatory Visit | Attending: Surgery | Admitting: Surgery

## 2023-09-12 ENCOUNTER — Ambulatory Visit (HOSPITAL_BASED_OUTPATIENT_CLINIC_OR_DEPARTMENT_OTHER)
Admission: RE | Admit: 2023-09-12 | Discharge: 2023-09-12 | Disposition: A | Discharge status: Home / Self Care | Payer: Medicare Other | Source: Ambulatory Visit | Attending: Surgery | Admitting: Surgery

## 2023-09-12 ENCOUNTER — Ambulatory Visit (INDEPENDENT_AMBULATORY_CARE_PROVIDER_SITE_OTHER): Payer: Medicare Other | Admitting: Surgery

## 2023-09-12 ENCOUNTER — Other Ambulatory Visit: Payer: Self-pay

## 2023-09-12 VITALS — BP 147/70 | HR 72 | Temp 98.0°F | Ht 62.0 in | Wt 138.0 lb

## 2023-09-12 DIAGNOSIS — I714 Abdominal aortic aneurysm, without rupture, unspecified: Secondary | ICD-10-CM | POA: Diagnosis present

## 2023-09-12 DIAGNOSIS — Z95828 Presence of other vascular implants and grafts: Secondary | ICD-10-CM | POA: Insufficient documentation

## 2023-09-12 DIAGNOSIS — I6523 Occlusion and stenosis of bilateral carotid arteries: Secondary | ICD-10-CM | POA: Insufficient documentation

## 2023-09-12 DIAGNOSIS — I701 Atherosclerosis of renal artery: Secondary | ICD-10-CM | POA: Diagnosis not present

## 2023-09-12 DIAGNOSIS — I70213 Atherosclerosis of native arteries of extremities with intermittent claudication, bilateral legs: Secondary | ICD-10-CM | POA: Insufficient documentation

## 2023-09-12 LAB — VAS US ABI WITH/WO TBI
Left ABI: 0.89
Right ABI: 0.55

## 2023-09-12 NOTE — Progress Notes (Signed)
 [facility] [place]  Patient name: [name] MRN: [id number] DOB: 1940-07-28 Sex: female   REASON FOR VISIT:    Follow-up  HISOTRY OF PRESENT ILLNESS:    [name] is a 83 y.o. female who has undergone the following vascular procedures: 03/11/2005: Left SFA angioplasty Soila) 10/03/2012: Diagnostic angiography for claudication 10/12/2012: Left femoral to below-knee popliteal artery bypass graft with saphenous vein Soila) 05/29/2019: Laser atherectomy with DCB, right leg (Garrie Elenes) 06/19/2019: Left renal artery stent (Koston Hennes) 02/27/2021:  Left-sided TCAR (: Left-sided TCAR )(asymptomatic (Effa Yarrow) 08/17/2022: Left external iliac stent (Deb Loudin)   She is back today for follow-up.  She takes a statin for hypercholesterolemia.  She is medically managed for hypertension.  She is a diabetic.  She has been having some left ankle issues that did improve after iliac stenting.  She denies any neurologic symptoms.  She wears compression for swelling   PAST MEDICAL HISTORY:   Past Medical History:  Diagnosis Date   Carotid artery occlusion    Diabetes mellitus    borderline , no longer treated /w medicine    Hyperlipidemia    Hypertension    Peripheral arterial disease (HCC)    Peripheral vascular disease (HCC)    emboli of left foot from left superficial femoral and popliteal occulsive disease   Renal artery stenosis (HCC)    s/p left renal artery stent 06/19/19   Stroke (HCC) 01/04/2006     FAMILY HISTORY:   Family History  Problem Relation Age of Onset   Coronary artery disease Mother    Heart disease Mother        After age 43   Heart attack Mother    Coronary artery disease Sister    Heart disease Sister        Before  age 73 -  Heart Transplant   Cancer Sister        Kidney   Heart attack Sister    Heart disease Father        After age 6   Other Father        amputation    SOCIAL HISTORY:    Social History   Tobacco Use   Smoking status: Every Day    Current packs/day: 0.50    Average packs/day: 0.5 packs/day for 50.0 years (25.0 ttl pk-yrs)    Types: Cigarettes    Passive exposure: Current (Daughter Is a smoker)   Smokeless tobacco: Never  Substance Use Topics   Alcohol use: No     ALLERGIES:   No Known Allergies   CURRENT MEDICATIONS:   Current Outpatient Medications  Medication Sig Dispense Refill   acetaminophen  (TYLENOL ) 325 MG tablet Take 650 mg by mouth every 6 (six) hours as needed for moderate pain.     amLODipine  (NORVASC ) 5 MG tablet Take 5 mg by mouth in the morning.     Ascorbic Acid (VITAMIN C PO) Take 1 tablet by mouth daily.     aspirin  81 MG EC tablet Take 81 mg by mouth every evening.     clopidogrel  (PLAVIX ) 75 MG tablet Take 75 mg by mouth every evening.     losartan  (COZAAR ) 50 MG tablet Take 100 mg by mouth every evening.     metFORMIN  (GLUCOPHAGE -XR) 500 MG 24 hr tablet Take 500 mg by mouth every evening.     oxyCODONE -acetaminophen  (PERCOCET/ROXICET) 5-325 MG tablet Take 1 tablet by mouth every 6 (six) hours as needed for moderate pain. 12 tablet 0  Propylene Glycol (SYSTANE COMPLETE OP) Place 1 drop into the left eye in the morning.     simvastatin (ZOCOR) 80 MG tablet Take 80 mg by mouth at bedtime.     vitamin B-12 (CYANOCOBALAMIN) 1000 MCG tablet Take 1,000 mcg by mouth in the morning.     No current facility-administered medications for this visit.    REVIEW OF SYSTEMS:   [X]  denotes positive finding, [ ]  denotes negative finding Cardiac  Comments:  Chest pain or chest pressure:    Shortness of breath upon exertion:    Short of breath when lying flat:    Irregular heart rhythm:        Vascular    Pain in calf, thigh, or hip brought on by ambulation:    Pain in feet at night that wakes you up from your sleep:     Blood clot in your veins:    Leg swelling:  x       Pulmonary    Oxygen at home:    Productive cough:      Wheezing:         Neurologic    Sudden weakness in arms or legs:     Sudden numbness in arms or legs:     Sudden onset of difficulty speaking or slurred speech:    Temporary loss of vision in one eye:     Problems with dizziness:         Gastrointestinal    Blood in stool:     Vomited blood:         Genitourinary    Burning when urinating:     Blood in urine:        Psychiatric    Major depression:         Hematologic    Bleeding problems:    Problems with blood clotting too easily:        Skin    Rashes or ulcers:        Constitutional    Fever or chills:      PHYSICAL EXAM:   Vitals:   09/12/23 1121  BP: (!) 147/70  Pulse: 72  Temp: 98 F (36.7 C)  SpO2: 95%  Weight: 138 lb (62.6 kg)  Height: 5' 2 (1.575 m)    GENERAL: The patient is a well-nourished female, in no acute distress. The vital signs are documented above. CARDIAC: There is a regular rate and rhythm.  VASCULAR: Left leg edema pulses PULMONARY: Non-labored respirations MUSCULOSKELETAL: There are no major deformities or cyanosis. NEUROLOGIC: No focal weakness or paresthesias are detected. SKIN: There are no ulcers or rashes noted. PSYCHIATRIC: The patient has a normal affect.  STUDIES:   I have reviewed the following: Renal:    Right: Right kidney and renal artery are not well visualized.  Left:  LRV flow present. Normal size of left kidney. Abnormal left         Resisitve Index. 1-59% stenosis of the left renal artery.  Mesenteric:  70 to 99% stenosis in the celiac artery and superior mesenteric artery.  Areas of  limited visceral study include right renal artery, right kidney size and  right  parenchymal flow.     +-------+-----------+-----------+------------+------------+  ABI/TBIToday's ABIToday's TBIPrevious ABIPrevious TBI  +-------+-----------+-----------+------------+------------+  Right 0.55       0.41       0.53        0.38           +-------+-----------+-----------+------------+------------+  Left  0.89  0.17       0.81        0.38          +-------+-----------+-----------+------------+------------+  Left: Patent left fem-pop bypass graft, there is no evidence of stenosis.   MEDICAL ISSUES:   Renal: No significant renal artery stenosis.  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  I would not repeat this unless she starts complaining of postprandial abdominal pain  Carotid: She will need repeat imaging in 6 months  PAD: 6 months ago her ultrasound suggested an anastomotic stenosis.  This was not replicated today.  She will have this reevaluated in 6 months    Malvina Serene CLORE, MD, FACS [facility] [phone] Pager [phone]

## 2023-09-13 ENCOUNTER — Other Ambulatory Visit: Payer: Self-pay

## 2023-09-13 DIAGNOSIS — I739 Peripheral vascular disease, unspecified: Secondary | ICD-10-CM

## 2023-09-13 DIAGNOSIS — I6523 Occlusion and stenosis of bilateral carotid arteries: Secondary | ICD-10-CM

## 2024-03-12 ENCOUNTER — Other Ambulatory Visit (HOSPITAL_COMMUNITY)

## 2024-03-12 ENCOUNTER — Encounter (HOSPITAL_COMMUNITY)

## 2024-03-12 ENCOUNTER — Ambulatory Visit
